# Patient Record
Sex: Female | Born: 1940 | Race: White | Hispanic: No | Smoking: Never smoker
Health system: Southern US, Community
[De-identification: ages and names within clinical notes are randomized; demographics above are authoritative.]

## PROBLEM LIST (undated history)

## (undated) DIAGNOSIS — K2950 Unspecified chronic gastritis without bleeding: Secondary | ICD-10-CM

## (undated) DIAGNOSIS — G4730 Sleep apnea, unspecified: Secondary | ICD-10-CM

## (undated) DIAGNOSIS — K229 Disease of esophagus, unspecified: Secondary | ICD-10-CM

## (undated) DIAGNOSIS — K7689 Other specified diseases of liver: Secondary | ICD-10-CM

## (undated) DIAGNOSIS — K449 Diaphragmatic hernia without obstruction or gangrene: Secondary | ICD-10-CM

## (undated) DIAGNOSIS — M797 Fibromyalgia: Secondary | ICD-10-CM

## (undated) DIAGNOSIS — K2100 Gastro-esophageal reflux disease with esophagitis, without bleeding: Secondary | ICD-10-CM

## (undated) DIAGNOSIS — K8020 Calculus of gallbladder without cholecystitis without obstruction: Secondary | ICD-10-CM

## (undated) DIAGNOSIS — I10 Essential (primary) hypertension: Secondary | ICD-10-CM

## (undated) DIAGNOSIS — S59909A Unspecified injury of unspecified elbow, initial encounter: Secondary | ICD-10-CM

## (undated) DIAGNOSIS — I4891 Unspecified atrial fibrillation: Secondary | ICD-10-CM

## (undated) DIAGNOSIS — E7800 Pure hypercholesterolemia, unspecified: Secondary | ICD-10-CM

## (undated) DIAGNOSIS — R7611 Nonspecific reaction to tuberculin skin test without active tuberculosis: Secondary | ICD-10-CM

## (undated) DIAGNOSIS — D126 Benign neoplasm of colon, unspecified: Secondary | ICD-10-CM

## (undated) DIAGNOSIS — I639 Cerebral infarction, unspecified: Secondary | ICD-10-CM

## (undated) DIAGNOSIS — N289 Disorder of kidney and ureter, unspecified: Secondary | ICD-10-CM

## (undated) DIAGNOSIS — N189 Chronic kidney disease, unspecified: Secondary | ICD-10-CM

## (undated) DIAGNOSIS — K5790 Diverticulosis of intestine, part unspecified, without perforation or abscess without bleeding: Secondary | ICD-10-CM

## (undated) DIAGNOSIS — D649 Anemia, unspecified: Secondary | ICD-10-CM

## (undated) DIAGNOSIS — M1990 Unspecified osteoarthritis, unspecified site: Secondary | ICD-10-CM

## (undated) DIAGNOSIS — M8580 Other specified disorders of bone density and structure, unspecified site: Secondary | ICD-10-CM

## (undated) HISTORY — PX: ADENOIDECTOMY: SUR15

## (undated) HISTORY — PX: BREAST BIOPSY: SHX20

## (undated) HISTORY — PX: CHOLECYSTECTOMY: SHX55

## (undated) HISTORY — PX: TONSILLECTOMY: SUR1361

## (undated) HISTORY — PX: APPENDECTOMY: SHX54

## (undated) HISTORY — DX: Pure hypercholesterolemia, unspecified: E78.00

## (undated) HISTORY — PX: RHIZOTOMY: SHX2355

## (undated) HISTORY — DX: Chronic kidney disease, unspecified: N18.9

## (undated) HISTORY — PX: JOINT REPLACEMENT: SHX530

## (undated) HISTORY — PX: OTHER SURGICAL HISTORY: SHX169

## (undated) HISTORY — DX: Fibromyalgia: M79.7

## (undated) HISTORY — PX: BILATERAL OOPHORECTOMY: SHX1221

## (undated) HISTORY — DX: Sleep apnea, unspecified: G47.30

## (undated) HISTORY — PX: BACK SURGERY: SHX140

## (undated) HISTORY — DX: Other specified diseases of liver: K76.89

## (undated) HISTORY — PX: BREAST EXCISIONAL BIOPSY: SUR124

## (undated) HISTORY — PX: TOTAL KNEE ARTHROPLASTY: SHX125

---

## 1964-10-06 HISTORY — PX: DILATION AND CURETTAGE OF UTERUS: SHX78

## 1969-10-06 HISTORY — PX: TUBAL LIGATION: SHX77

## 2004-10-29 ENCOUNTER — Inpatient Hospital Stay: Payer: Self-pay | Admitting: Specialist

## 2005-02-10 ENCOUNTER — Ambulatory Visit: Payer: Self-pay | Admitting: Specialist

## 2005-02-26 ENCOUNTER — Ambulatory Visit: Payer: Self-pay | Admitting: Internal Medicine

## 2005-07-14 ENCOUNTER — Ambulatory Visit: Payer: Self-pay | Admitting: Internal Medicine

## 2005-08-06 ENCOUNTER — Ambulatory Visit: Payer: Self-pay | Admitting: Internal Medicine

## 2009-07-30 ENCOUNTER — Ambulatory Visit: Payer: Self-pay | Admitting: Internal Medicine

## 2009-09-07 ENCOUNTER — Ambulatory Visit: Payer: Self-pay | Admitting: Gastroenterology

## 2009-09-27 ENCOUNTER — Ambulatory Visit: Payer: Self-pay | Admitting: Surgery

## 2009-10-04 ENCOUNTER — Ambulatory Visit: Payer: Self-pay | Admitting: Surgery

## 2011-08-19 ENCOUNTER — Ambulatory Visit: Payer: Self-pay | Admitting: Internal Medicine

## 2012-02-17 ENCOUNTER — Ambulatory Visit: Payer: Self-pay | Admitting: Internal Medicine

## 2014-01-02 ENCOUNTER — Ambulatory Visit: Payer: Self-pay | Admitting: Physician Assistant

## 2014-06-20 LAB — HM PAP SMEAR

## 2014-08-08 ENCOUNTER — Ambulatory Visit: Payer: Self-pay | Admitting: Obstetrics and Gynecology

## 2014-08-08 LAB — HM MAMMOGRAPHY

## 2014-08-08 LAB — HM DEXA SCAN

## 2014-11-09 ENCOUNTER — Ambulatory Visit: Payer: Self-pay | Admitting: Gastroenterology

## 2014-11-21 ENCOUNTER — Ambulatory Visit: Payer: Self-pay | Admitting: Gastroenterology

## 2014-11-21 DIAGNOSIS — K229 Disease of esophagus, unspecified: Secondary | ICD-10-CM

## 2014-11-21 HISTORY — DX: Disease of esophagus, unspecified: K22.9

## 2014-11-28 ENCOUNTER — Ambulatory Visit: Payer: Self-pay | Admitting: Gastroenterology

## 2015-01-29 LAB — SURGICAL PATHOLOGY

## 2015-04-19 DIAGNOSIS — G4733 Obstructive sleep apnea (adult) (pediatric): Secondary | ICD-10-CM | POA: Insufficient documentation

## 2015-06-26 ENCOUNTER — Ambulatory Visit (INDEPENDENT_AMBULATORY_CARE_PROVIDER_SITE_OTHER): Payer: Medicare Other | Admitting: Obstetrics and Gynecology

## 2015-06-26 ENCOUNTER — Encounter: Payer: Self-pay | Admitting: Obstetrics and Gynecology

## 2015-06-26 VITALS — BP 108/71 | HR 89 | Ht 67.0 in | Wt 227.1 lb

## 2015-06-26 DIAGNOSIS — I480 Paroxysmal atrial fibrillation: Secondary | ICD-10-CM | POA: Insufficient documentation

## 2015-06-26 DIAGNOSIS — E785 Hyperlipidemia, unspecified: Secondary | ICD-10-CM | POA: Insufficient documentation

## 2015-06-26 DIAGNOSIS — Z01419 Encounter for gynecological examination (general) (routine) without abnormal findings: Secondary | ICD-10-CM | POA: Diagnosis not present

## 2015-06-26 DIAGNOSIS — Z8739 Personal history of other diseases of the musculoskeletal system and connective tissue: Secondary | ICD-10-CM | POA: Insufficient documentation

## 2015-06-26 DIAGNOSIS — Z780 Asymptomatic menopausal state: Secondary | ICD-10-CM | POA: Diagnosis not present

## 2015-06-26 DIAGNOSIS — D649 Anemia, unspecified: Secondary | ICD-10-CM | POA: Insufficient documentation

## 2015-06-26 DIAGNOSIS — K7689 Other specified diseases of liver: Secondary | ICD-10-CM | POA: Insufficient documentation

## 2015-06-26 DIAGNOSIS — I10 Essential (primary) hypertension: Secondary | ICD-10-CM | POA: Insufficient documentation

## 2015-06-26 DIAGNOSIS — M1990 Unspecified osteoarthritis, unspecified site: Secondary | ICD-10-CM | POA: Insufficient documentation

## 2015-06-26 DIAGNOSIS — Z1239 Encounter for other screening for malignant neoplasm of breast: Secondary | ICD-10-CM

## 2015-06-26 DIAGNOSIS — M8580 Other specified disorders of bone density and structure, unspecified site: Secondary | ICD-10-CM | POA: Insufficient documentation

## 2015-06-26 DIAGNOSIS — N183 Chronic kidney disease, stage 3 unspecified: Secondary | ICD-10-CM | POA: Insufficient documentation

## 2015-06-26 DIAGNOSIS — I4891 Unspecified atrial fibrillation: Secondary | ICD-10-CM | POA: Insufficient documentation

## 2015-06-26 NOTE — Progress Notes (Signed)
GYNECOLOGY CLINIC PROGRESS NOTE  Subjective:    Jaclyn Galvan is a 74 y.o. [phone] postmenopausal female who presents for an annual exam. The patient has no complaints today. The patient is not currently sexually active. GYN screening history: last pap: patient does not recall when last pap was (several years ago), but denies h/o abnormal pap smears. Last mammogram: approximate date 08/2014 and was normal. The patient wears seatbelts: yes. The patient participates in regular exercise: no. Has the patient ever been transfused or tattooed?: no. The patient reports that there is not domestic violence in her life.  The patient currently experiences occasional hot flushes, for which she treats with OTC Estroven.  Denies episodes of postmenopausal bleeding.   The patient doese a DEXA scan diagnosis of Osteoporosis. Last DEXA Scan: 06/2012. Was previously on Fosamax for 7 years, discontinued 3 years ago. She is routinely taking Calcium with Vitamin D.  Menstrual History: OB History    Gravida Para Term Preterm AB TAB SAB Ectopic Multiple Living   6 3 3  3  3   3       No LMP recorded. Patient is postmenopausal.    Past Medical History  Diagnosis Date  . Skin cancer   . Breast cancer   . Fibromyalgia   . Liver cyst   . CKD (chronic kidney disease)   . Hypercholesterolemia   . Sleep apnea     Family History  Problem Relation Age of Onset  . Breast cancer Mother  (deceased at age 76)  . Diabetes Neg Hx   . Heart disease Neg Hx   . Colon cancer Neg Hx   . Ovarian cancer Neg Hx    Past Surgical History  Procedure Laterality Date  . Cholecystectomy    . Tubal ligation  1971  . Dilation and curettage of uterus  1966  . Appendectomy    . Bilateral oophorectomy Right   . Tonsillectomy    . Adenoidectomy    . Mastectomy Right   . Total knee arthroplasty Bilateral     2001,2006  . Back surgery       Social History   Social History  . Marital Status: Married    Spouse Name: N/A   . Number of Children: N/A  . Years of Education: N/A   Occupational History  . Not on file.   Social History Main Topics  . Smoking status: Never Smoker   . Smokeless tobacco: Never Used  . Alcohol Use: No  . Drug Use: No  . Sexual Activity: Not Currently    Birth Control/ Protection: Post-menopausal   Other Topics Concern  . Not on file   Social History Narrative   Current Outpatient Prescriptions on File Prior to Visit  Medication Sig Dispense Refill  . aspirin EC 81 MG tablet Take 81 mg by mouth daily.    Marland Kitchen aspirin-acetaminophen-caffeine (EXCEDRIN MIGRAINE) 250-250-65 MG per tablet Take by mouth every 6 (six) hours as needed for headache.    Marland Kitchen atorvastatin (LIPITOR) 10 MG tablet Take 10 mg by mouth daily.    . Black Cohosh-SoyIsoflav-Magnol (ESTROVEN MENOPAUSE RELIEF) CAPS Take by mouth.    . calcium carbonate (OS-CAL) 1250 (500 CA) MG chewable tablet Chew 1 tablet by mouth daily.    . cholecalciferol (VITAMIN D) 400 UNITS TABS tablet Take 400 Units by mouth.    . co-enzyme Q-10 30 MG capsule Take 30 mg by mouth 3 (three) times daily.    Marland Kitchen docusate sodium (COLACE) 100  MG capsule Take 100 mg by mouth 2 (two) times daily.    . DULoxetine (CYMBALTA) 20 MG capsule Take 20 mg by mouth daily.    Marland Kitchen losartan (COZAAR) 25 MG tablet Take 25 mg by mouth daily.    . Omega-3 Fatty Acids (FISH OIL) 1000 MG CAPS Take by mouth.    . vitamin B-12 (CYANOCOBALAMIN) 100 MCG tablet Take 100 mcg by mouth daily.     No current facility-administered medications on file prior to visit.    Allergies  Allergen Reactions  . Ciprofloxacin   . Morphine And Related     Review of Systems Constitutional: negative for chills, fatigue, fevers and sweats Eyes: negative for irritation, redness and visual disturbance Ears, nose, mouth, throat, and face: negative for hearing loss, nasal congestion, snoring and tinnitus Respiratory: negative for asthma, cough, sputum Cardiovascular: negative for chest  pain, dyspnea, exertional chest pressure/discomfort, irregular heart beat, palpitations and syncope Gastrointestinal: negative for abdominal pain, change in bowel habits, nausea and vomiting Genitourinary: positive for occasional urinary urgency (mostly in the mornings). Negative for abnormal menstrual periods, genital lesions, sexual problems and vaginal discharge, dysuria and urinary incontinence. Integument/breast: negative for breast lump, breast tenderness and nipple discharge Hematologic/lymphatic: negative for bleeding and easy bruising Musculoskeletal:negative for back pain and muscle weakness Neurological: negative for dizziness, headaches, vertigo and weakness Endocrine: negative for diabetic symptoms including polydipsia, polyuria and skin dryness Allergic/Immunologic: negative for hay fever and urticaria     Objective:  Blood pressure 108/71, pulse 89, height 5\' 7"  (1.702 m), weight 227 lb 1 oz (102.995 kg).  General Appearance:    Alert, cooperative, no distress, appears stated age  Head:    Normocephalic, without obvious abnormality, atraumatic  Eyes:    PERRL, conjunctiva/corneas clear, EOM's intact, both eyes  Ears:    Normal external ear canals, both ears  Nose:   Nares normal, septum midline, mucosa normal, no drainage or sinus tenderness  Throat:   Lips, mucosa, and tongue normal; teeth and gums normal  Neck:   Supple, symmetrical, trachea midline, no adenopathy; thyroid: no enlargement/tenderness/nodules; no carotid bruit or JVD  Back:     Symmetric, no curvature, ROM normal, no CVA tenderness  Lungs:     Clear to auscultation bilaterally, respirations unlabored  Chest Wall:    No tenderness or deformity   Heart:    Regular rate and rhythm, S1 and S2 normal, no murmur, rub or gallop  Breast Exam:    No tenderness, masses, or nipple abnormality  Abdomen:     Soft, non-tender, bowel sounds active all four quadrants, no masses, no organomegaly. Vertical accentric  inframumbilical scar present.   Genitalia:    Pelvic:external genitalia normal, vagina with lesions, discharge, or tenderness, rectovaginal septum  normal. Cervix normal in appearance, no cervical motion tenderness, no adnexal masses or tenderness.  Senile uterus, nontender.    Rectal:    Normal external sphincter.  No hemorrhoids appreciated. Internal exam not done.   Extremities:   Extremities normal, atraumatic, no cyanosis or edema.  Severe varicose veins present in lower extremities bilaterally.   Pulses:   2+ and symmetric all extremities  Skin:   Skin color, texture, turgor normal, no rashes or lesions  Lymph nodes:   Cervical, supraclavicular, and axillary nodes normal  Neurologic:   CNII-XII intact, normal strength, sensation and reflexes throughout     Assessment:    Healthy postmenopausal female exam.    Remote h/o breast cancer Mild postmenopausal vasomotor symptoms, controlled  Plan:     Breast self exam technique reviewed and patient encouraged to perform self-exam monthly. Discussed healthy lifestyle modifications.  Is planning to resume exercising regularly (discontinued for a while due to dx of plantar fascitis).  Mammogram. To be ordered.  Will likely discontinue after next year.  Dexa Scan with T score of -1.4 on 08/2014. H/o osteoporosis s/p treatment.  Will need repeat next year. To continue Vit D and Calcium supplementation.  Can continue with Estroven as needed for menopausal mild vasomotor symptoms.  Will order Colonoscopy if has not been performed within past 10 years.  RTC in 1 year.    Rubie Maid, MD [facility]

## 2015-06-26 NOTE — Patient Instructions (Addendum)
Preventive Care for Adults A healthy lifestyle and preventive care can promote health and wellness. Preventive health guidelines for women include the following key practices.  A routine yearly physical is a good way to check with your health care provider about your health and preventive screening. It is a chance to share any concerns and updates on your health and to receive a thorough exam.  Visit your dentist for a routine exam and preventive care every 6 months. Brush your teeth twice a day and floss once a day. Good oral hygiene prevents tooth decay and gum disease.  The frequency of eye exams is based on your age, health, family medical history, use of contact lenses, and other factors. Follow your health care provider's recommendations for frequency of eye exams.  Eat a healthy diet. Foods like vegetables, fruits, whole grains, low-fat dairy products, and lean protein foods contain the nutrients you need without too many calories. Decrease your intake of foods high in solid fats, added sugars, and salt. Eat the right amount of calories for you.Get information about a proper diet from your health care provider, if necessary.  Regular physical exercise is one of the most important things you can do for your health. Most adults should get at least 150 minutes of moderate-intensity exercise (any activity that increases your heart rate and causes you to sweat) each week. In addition, most adults need muscle-strengthening exercises on 2 or more days a week.  Maintain a healthy weight. The body mass index (BMI) is a screening tool to identify possible weight problems. It provides an estimate of body fat based on height and weight. Your health care provider can find your BMI and can help you achieve or maintain a healthy weight.For adults 20 years and older:  A BMI below 18.5 is considered underweight.  A BMI of 18.5 to 24.9 is normal.  A BMI of 25 to 29.9 is considered overweight.  A BMI of  30 and above is considered obese.  Maintain normal blood lipids and cholesterol levels by exercising and minimizing your intake of saturated fat. Eat a balanced diet with plenty of fruit and vegetables. Blood tests for lipids and cholesterol should begin at age 20 and be repeated every 5 years. If your lipid or cholesterol levels are high, you are over 50, or you are at high risk for heart disease, you may need your cholesterol levels checked more frequently.Ongoing high lipid and cholesterol levels should be treated with medicines if diet and exercise are not working.  If you smoke, find out from your health care provider how to quit. If you do not use tobacco, do not start.  Lung cancer screening is recommended for adults aged 55-80 years who are at high risk for developing lung cancer because of a history of smoking. A yearly low-dose CT scan of the lungs is recommended for people who have at least a 30-pack-year history of smoking and are a current smoker or have quit within the past 15 years. A pack year of smoking is smoking an average of 1 pack of cigarettes a day for 1 year (for example: 1 pack a day for 30 years or 2 packs a day for 15 years). Yearly screening should continue until the smoker has stopped smoking for at least 15 years. Yearly screening should be stopped for people who develop a health problem that would prevent them from having lung cancer treatment.  If you are pregnant, do not drink alcohol. If you are breastfeeding,   be very cautious about drinking alcohol. If you are not pregnant and choose to drink alcohol, do not have more than 1 drink per day. One drink is considered to be 12 ounces (355 mL) of beer, 5 ounces (148 mL) of wine, or 1.5 ounces (44 mL) of liquor.  Avoid use of street drugs. Do not share needles with anyone. Ask for help if you need support or instructions about stopping the use of drugs.  High blood pressure causes heart disease and increases the risk of  stroke. Your blood pressure should be checked at least every 1 to 2 years. Ongoing high blood pressure should be treated with medicines if weight loss and exercise do not work.  If you are 75-52 years old, ask your health care provider if you should take aspirin to prevent strokes.  Diabetes screening involves taking a blood sample to check your fasting blood sugar level. This should be done once every 3 years, after age 15, if you are within normal weight and without risk factors for diabetes. Testing should be considered at a younger age or be carried out more frequently if you are overweight and have at least 1 risk factor for diabetes.  Breast cancer screening is essential preventive care for women. You should practice "breast self-awareness." This means understanding the normal appearance and feel of your breasts and may include breast self-examination. Any changes detected, no matter how small, should be reported to a health care provider. Women in their 58s and 30s should have a clinical breast exam (CBE) by a health care provider as part of a regular health exam every 1 to 3 years. After age 16, women should have a CBE every year. Starting at age 53, women should consider having a mammogram (breast X-ray test) every year. Women who have a family history of breast cancer should talk to their health care provider about genetic screening. Women at a high risk of breast cancer should talk to their health care providers about having an MRI and a mammogram every year.  Breast cancer gene (BRCA)-related cancer risk assessment is recommended for women who have family members with BRCA-related cancers. BRCA-related cancers include breast, ovarian, tubal, and peritoneal cancers. Having family members with these cancers may be associated with an increased risk for harmful changes (mutations) in the breast cancer genes BRCA1 and BRCA2. Results of the assessment will determine the need for genetic counseling and  BRCA1 and BRCA2 testing.  Routine pelvic exams to screen for cancer are no longer recommended for nonpregnant women who are considered low risk for cancer of the pelvic organs (ovaries, uterus, and vagina) and who do not have symptoms. Ask your health care provider if a screening pelvic exam is right for you.  If you have had past treatment for cervical cancer or a condition that could lead to cancer, you need Pap tests and screening for cancer for at least 20 years after your treatment. If Pap tests have been discontinued, your risk factors (such as having a new sexual partner) need to be reassessed to determine if screening should be resumed. Some women have medical problems that increase the chance of getting cervical cancer. In these cases, your health care provider may recommend more frequent screening and Pap tests.  The HPV test is an additional test that may be used for cervical cancer screening. The HPV test looks for the virus that can cause the cell changes on the cervix. The cells collected during the Pap test can be  tested for HPV. The HPV test could be used to screen women aged 30 years and older, and should be used in women of any age who have unclear Pap test results. After the age of 30, women should have HPV testing at the same frequency as a Pap test.  Colorectal cancer can be detected and often prevented. Most routine colorectal cancer screening begins at the age of 50 years and continues through age 75 years. However, your health care provider may recommend screening at an earlier age if you have risk factors for colon cancer. On a yearly basis, your health care provider may provide home test kits to check for hidden blood in the stool. Use of a small camera at the end of a tube, to directly examine the colon (sigmoidoscopy or colonoscopy), can detect the earliest forms of colorectal cancer. Talk to your health care provider about this at age 50, when routine screening begins. Direct  exam of the colon should be repeated every 5-10 years through age 75 years, unless early forms of pre-cancerous polyps or small growths are found.  People who are at an increased risk for hepatitis B should be screened for this virus. You are considered at high risk for hepatitis B if:  You were born in a country where hepatitis B occurs often. Talk with your health care provider about which countries are considered high risk.  Your parents were born in a high-risk country and you have not received a shot to protect against hepatitis B (hepatitis B vaccine).  You have HIV or AIDS.  You use needles to inject street drugs.  You live with, or have sex with, someone who has hepatitis B.  You get hemodialysis treatment.  You take certain medicines for conditions like cancer, organ transplantation, and autoimmune conditions.  Hepatitis C blood testing is recommended for all people born from 1945 through 1965 and any individual with known risks for hepatitis C.  Practice safe sex. Use condoms and avoid high-risk sexual practices to reduce the spread of sexually transmitted infections (STIs). STIs include gonorrhea, chlamydia, syphilis, trichomonas, herpes, HPV, and human immunodeficiency virus (HIV). Herpes, HIV, and HPV are viral illnesses that have no cure. They can result in disability, cancer, and death.  You should be screened for sexually transmitted illnesses (STIs) including gonorrhea and chlamydia if:  You are sexually active and are younger than 24 years.  You are older than 24 years and your health care provider tells you that you are at risk for this type of infection.  Your sexual activity has changed since you were last screened and you are at an increased risk for chlamydia or gonorrhea. Ask your health care provider if you are at risk.  If you are at risk of being infected with HIV, it is recommended that you take a prescription medicine daily to prevent HIV infection. This is  called preexposure prophylaxis (PrEP). You are considered at risk if:  You are a heterosexual woman, are sexually active, and are at increased risk for HIV infection.  You take drugs by injection.  You are sexually active with a partner who has HIV.  Talk with your health care provider about whether you are at high risk of being infected with HIV. If you choose to begin PrEP, you should first be tested for HIV. You should then be tested every 3 months for as long as you are taking PrEP.  Osteoporosis is a disease in which the bones lose minerals and strength   with aging. This can result in serious bone fractures or breaks. The risk of osteoporosis can be identified using a bone density scan. Women ages 65 years and over and women at risk for fractures or osteoporosis should discuss screening with their health care providers. Ask your health care provider whether you should take a calcium supplement or vitamin D to reduce the rate of osteoporosis.  Menopause can be associated with physical symptoms and risks. Hormone replacement therapy is available to decrease symptoms and risks. You should talk to your health care provider about whether hormone replacement therapy is right for you.  Use sunscreen. Apply sunscreen liberally and repeatedly throughout the day. You should seek shade when your shadow is shorter than you. Protect yourself by wearing long sleeves, pants, a wide-brimmed hat, and sunglasses year round, whenever you are outdoors.  Once a month, do a whole body skin exam, using a mirror to look at the skin on your back. Tell your health care provider of new moles, moles that have irregular borders, moles that are larger than a pencil eraser, or moles that have changed in shape or color.  Stay current with required vaccines (immunizations).  Influenza vaccine. All adults should be immunized every year.  Tetanus, diphtheria, and acellular pertussis (Td, Tdap) vaccine. Pregnant women should  receive 1 dose of Tdap vaccine during each pregnancy. The dose should be obtained regardless of the length of time since the last dose. Immunization is preferred during the 27th-36th week of gestation. An adult who has not previously received Tdap or who does not know her vaccine status should receive 1 dose of Tdap. This initial dose should be followed by tetanus and diphtheria toxoids (Td) booster doses every 10 years. Adults with an unknown or incomplete history of completing a 3-dose immunization series with Td-containing vaccines should begin or complete a primary immunization series including a Tdap dose. Adults should receive a Td booster every 10 years.  Varicella vaccine. An adult without evidence of immunity to varicella should receive 2 doses or a second dose if she has previously received 1 dose. Pregnant females who do not have evidence of immunity should receive the first dose after pregnancy. This first dose should be obtained before leaving the health care facility. The second dose should be obtained 4-8 weeks after the first dose.  Human papillomavirus (HPV) vaccine. Females aged 13-26 years who have not received the vaccine previously should obtain the 3-dose series. The vaccine is not recommended for use in pregnant females. However, pregnancy testing is not needed before receiving a dose. If a female is found to be pregnant after receiving a dose, no treatment is needed. In that case, the remaining doses should be delayed until after the pregnancy. Immunization is recommended for any person with an immunocompromised condition through the age of 26 years if she did not get any or all doses earlier. During the 3-dose series, the second dose should be obtained 4-8 weeks after the first dose. The third dose should be obtained 24 weeks after the first dose and 16 weeks after the second dose.  Zoster vaccine. One dose is recommended for adults aged 60 years or older unless certain conditions are  present.  Measles, mumps, and rubella (MMR) vaccine. Adults born before 1957 generally are considered immune to measles and mumps. Adults born in 1957 or later should have 1 or more doses of MMR vaccine unless there is a contraindication to the vaccine or there is laboratory evidence of immunity to   each of the three diseases. A routine second dose of MMR vaccine should be obtained at least 28 days after the first dose for students attending postsecondary schools, health care workers, or international travelers. People who received inactivated measles vaccine or an unknown type of measles vaccine during 1963-1967 should receive 2 doses of MMR vaccine. People who received inactivated mumps vaccine or an unknown type of mumps vaccine before 1979 and are at high risk for mumps infection should consider immunization with 2 doses of MMR vaccine. For females of childbearing age, rubella immunity should be determined. If there is no evidence of immunity, females who are not pregnant should be vaccinated. If there is no evidence of immunity, females who are pregnant should delay immunization until after pregnancy. Unvaccinated health care workers born before 1957 who lack laboratory evidence of measles, mumps, or rubella immunity or laboratory confirmation of disease should consider measles and mumps immunization with 2 doses of MMR vaccine or rubella immunization with 1 dose of MMR vaccine.  Pneumococcal 13-valent conjugate (PCV13) vaccine. When indicated, a person who is uncertain of her immunization history and has no record of immunization should receive the PCV13 vaccine. An adult aged 19 years or older who has certain medical conditions and has not been previously immunized should receive 1 dose of PCV13 vaccine. This PCV13 should be followed with a dose of pneumococcal polysaccharide (PPSV23) vaccine. The PPSV23 vaccine dose should be obtained at least 8 weeks after the dose of PCV13 vaccine. An adult aged 19  years or older who has certain medical conditions and previously received 1 or more doses of PPSV23 vaccine should receive 1 dose of PCV13. The PCV13 vaccine dose should be obtained 1 or more years after the last PPSV23 vaccine dose.  Pneumococcal polysaccharide (PPSV23) vaccine. When PCV13 is also indicated, PCV13 should be obtained first. All adults aged 65 years and older should be immunized. An adult younger than age 65 years who has certain medical conditions should be immunized. Any person who resides in a nursing home or long-term care facility should be immunized. An adult smoker should be immunized. People with an immunocompromised condition and certain other conditions should receive both PCV13 and PPSV23 vaccines. People with human immunodeficiency virus (HIV) infection should be immunized as soon as possible after diagnosis. Immunization during chemotherapy or radiation therapy should be avoided. Routine use of PPSV23 vaccine is not recommended for American Indians, [place] Natives, or people younger than 65 years unless there are medical conditions that require PPSV23 vaccine. When indicated, people who have unknown immunization and have no record of immunization should receive PPSV23 vaccine. One-time revaccination 5 years after the first dose of PPSV23 is recommended for people aged 19-64 years who have chronic kidney failure, nephrotic syndrome, asplenia, or immunocompromised conditions. People who received 1-2 doses of PPSV23 before age 65 years should receive another dose of PPSV23 vaccine at age 65 years or later if at least 5 years have passed since the previous dose. Doses of PPSV23 are not needed for people immunized with PPSV23 at or after age 65 years.  Meningococcal vaccine. Adults with asplenia or persistent complement component deficiencies should receive 2 doses of quadrivalent meningococcal conjugate (MenACWY-D) vaccine. The doses should be obtained at least 2 months apart.  Microbiologists working with certain meningococcal bacteria, military recruits, people at risk during an outbreak, and people who travel to or live in countries with a high rate of meningitis should be immunized. A first-year college student up through age   21 years who is living in a residence hall should receive a dose if she did not receive a dose on or after her 16th birthday. Adults who have certain high-risk conditions should receive one or more doses of vaccine.  Hepatitis A vaccine. Adults who wish to be protected from this disease, have certain high-risk conditions, work with hepatitis A-infected animals, work in hepatitis A research labs, or travel to or work in countries with a high rate of hepatitis A should be immunized. Adults who were previously unvaccinated and who anticipate close contact with an international adoptee during the first 60 days after arrival in the [place] from a country with a high rate of hepatitis A should be immunized.  Hepatitis B vaccine. Adults who wish to be protected from this disease, have certain high-risk conditions, may be exposed to blood or other infectious body fluids, are household contacts or sex partners of hepatitis B positive people, are clients or workers in certain care facilities, or travel to or work in countries with a high rate of hepatitis B should be immunized.  Haemophilus influenzae type b (Hib) vaccine. A previously unvaccinated person with asplenia or sickle cell disease or having a scheduled splenectomy should receive 1 dose of Hib vaccine. Regardless of previous immunization, a recipient of a hematopoietic stem cell transplant should receive a 3-dose series 6-12 months after her successful transplant. Hib vaccine is not recommended for adults with HIV infection. Preventive Services / Frequency Ages 64 to 68 years  Blood pressure check.** / Every 1 to 2 years.  Lipid and cholesterol check.** / Every 5 years beginning at age  22.  Clinical breast exam.** / Every 3 years for women in their 88s and 53s.  BRCA-related cancer risk assessment.** / For women who have family members with a BRCA-related cancer (breast, ovarian, tubal, or peritoneal cancers).  Pap test.** / Every 2 years from ages 90 through 51. Every 3 years starting at age 21 through age 56 or 3 with a history of 3 consecutive normal Pap tests.  HPV screening.** / Every 3 years from ages 24 through ages 1 to 46 with a history of 3 consecutive normal Pap tests.  Hepatitis C blood test.** / For any individual with known risks for hepatitis C.  Skin self-exam. / Monthly.  Influenza vaccine. / Every year.  Tetanus, diphtheria, and acellular pertussis (Tdap, Td) vaccine.** / Consult your health care provider. Pregnant women should receive 1 dose of Tdap vaccine during each pregnancy. 1 dose of Td every 10 years.  Varicella vaccine.** / Consult your health care provider. Pregnant females who do not have evidence of immunity should receive the first dose after pregnancy.  HPV vaccine. / 3 doses over 6 months, if 72 and younger. The vaccine is not recommended for use in pregnant females. However, pregnancy testing is not needed before receiving a dose.  Measles, mumps, rubella (MMR) vaccine.** / You need at least 1 dose of MMR if you were born in 1957 or later. You may also need a 2nd dose. For females of childbearing age, rubella immunity should be determined. If there is no evidence of immunity, females who are not pregnant should be vaccinated. If there is no evidence of immunity, females who are pregnant should delay immunization until after pregnancy.  Pneumococcal 13-valent conjugate (PCV13) vaccine.** / Consult your health care provider.  Pneumococcal polysaccharide (PPSV23) vaccine.** / 1 to 2 doses if you smoke cigarettes or if you have certain conditions.  Meningococcal vaccine.** /  1 dose if you are age 19 to 21 years and a first-year college  student living in a residence hall, or have one of several medical conditions, you need to get vaccinated against meningococcal disease. You may also need additional booster doses.  Hepatitis A vaccine.** / Consult your health care provider.  Hepatitis B vaccine.** / Consult your health care provider.  Haemophilus influenzae type b (Hib) vaccine.** / Consult your health care provider. Ages 40 to 64 years  Blood pressure check.** / Every 1 to 2 years.  Lipid and cholesterol check.** / Every 5 years beginning at age 20 years.  Lung cancer screening. / Every year if you are aged 55-80 years and have a 30-pack-year history of smoking and currently smoke or have quit within the past 15 years. Yearly screening is stopped once you have quit smoking for at least 15 years or develop a health problem that would prevent you from having lung cancer treatment.  Clinical breast exam.** / Every year after age 40 years.  BRCA-related cancer risk assessment.** / For women who have family members with a BRCA-related cancer (breast, ovarian, tubal, or peritoneal cancers).  Mammogram.** / Every year beginning at age 40 years and continuing for as long as you are in good health. Consult with your health care provider.  Pap test.** / Every 3 years starting at age 30 years through age 65 or 70 years with a history of 3 consecutive normal Pap tests.  HPV screening.** / Every 3 years from ages 30 years through ages 65 to 70 years with a history of 3 consecutive normal Pap tests.  Fecal occult blood test (FOBT) of stool. / Every year beginning at age 50 years and continuing until age 75 years. You may not need to do this test if you get a colonoscopy every 10 years.  Flexible sigmoidoscopy or colonoscopy.** / Every 5 years for a flexible sigmoidoscopy or every 10 years for a colonoscopy beginning at age 50 years and continuing until age 75 years.  Hepatitis C blood test.** / For all people born from 1945 through  1965 and any individual with known risks for hepatitis C.  Skin self-exam. / Monthly.  Influenza vaccine. / Every year.  Tetanus, diphtheria, and acellular pertussis (Tdap/Td) vaccine.** / Consult your health care provider. Pregnant women should receive 1 dose of Tdap vaccine during each pregnancy. 1 dose of Td every 10 years.  Varicella vaccine.** / Consult your health care provider. Pregnant females who do not have evidence of immunity should receive the first dose after pregnancy.  Zoster vaccine.** / 1 dose for adults aged 60 years or older.  Measles, mumps, rubella (MMR) vaccine.** / You need at least 1 dose of MMR if you were born in 1957 or later. You may also need a 2nd dose. For females of childbearing age, rubella immunity should be determined. If there is no evidence of immunity, females who are not pregnant should be vaccinated. If there is no evidence of immunity, females who are pregnant should delay immunization until after pregnancy.  Pneumococcal 13-valent conjugate (PCV13) vaccine.** / Consult your health care provider.  Pneumococcal polysaccharide (PPSV23) vaccine.** / 1 to 2 doses if you smoke cigarettes or if you have certain conditions.  Meningococcal vaccine.** / Consult your health care provider.  Hepatitis A vaccine.** / Consult your health care provider.  Hepatitis B vaccine.** / Consult your health care provider.  Haemophilus influenzae type b (Hib) vaccine.** / Consult your health care provider. Ages 65   years and over  Blood pressure check.** / Every 1 to 2 years.  Lipid and cholesterol check.** / Every 5 years beginning at age 22 years.  Lung cancer screening. / Every year if you are aged 73-80 years and have a 30-pack-year history of smoking and currently smoke or have quit within the past 15 years. Yearly screening is stopped once you have quit smoking for at least 15 years or develop a health problem that would prevent you from having lung cancer  treatment.  Clinical breast exam.** / Every year after age 4 years.  BRCA-related cancer risk assessment.** / For women who have family members with a BRCA-related cancer (breast, ovarian, tubal, or peritoneal cancers).  Mammogram.** / Every year beginning at age 40 years and continuing for as long as you are in good health. Consult with your health care provider.  Pap test.** / Every 3 years starting at age 9 years through age 34 or 91 years with 3 consecutive normal Pap tests. Testing can be stopped between 65 and 70 years with 3 consecutive normal Pap tests and no abnormal Pap or HPV tests in the past 10 years.  HPV screening.** / Every 3 years from ages 57 years through ages 64 or 45 years with a history of 3 consecutive normal Pap tests. Testing can be stopped between 65 and 70 years with 3 consecutive normal Pap tests and no abnormal Pap or HPV tests in the past 10 years.  Fecal occult blood test (FOBT) of stool. / Every year beginning at age 15 years and continuing until age 17 years. You may not need to do this test if you get a colonoscopy every 10 years.  Flexible sigmoidoscopy or colonoscopy.** / Every 5 years for a flexible sigmoidoscopy or every 10 years for a colonoscopy beginning at age 86 years and continuing until age 71 years.  Hepatitis C blood test.** / For all people born from [birth year] through 1965 and any individual with known risks for hepatitis C.  Osteoporosis screening.** / A one-time screening for women ages 83 years and over and women at risk for fractures or osteoporosis.  Skin self-exam. / Monthly.  Influenza vaccine. / Every year.  Tetanus, diphtheria, and acellular pertussis (Tdap/Td) vaccine.** / 1 dose of Td every 10 years.  Varicella vaccine.** / Consult your health care provider.  Zoster vaccine.** / 1 dose for adults aged 61 years or older.  Pneumococcal 13-valent conjugate (PCV13) vaccine.** / Consult your health care provider.  Pneumococcal  polysaccharide (PPSV23) vaccine.** / 1 dose for all adults aged 28 years and older.  Meningococcal vaccine.** / Consult your health care provider.  Hepatitis A vaccine.** / Consult your health care provider.  Hepatitis B vaccine.** / Consult your health care provider.  Haemophilus influenzae type b (Hib) vaccine.** / Consult your health care provider. ** Family history and personal history of risk and conditions may change your health care provider's recommendations. Document Released: 11/18/2001 Document Revised: 02/06/2014 Document Reviewed: 02/17/2011 [facility] Patient Information 2015 [place], [place]. This information is not intended to replace advice given to you by your health care provider. Make sure you discuss any questions you have with your health care provider.

## 2015-06-28 NOTE — Addendum Note (Signed)
Addended by: Earl Lagos on: 06/28/2015 08:19 AM   Modules accepted: Orders, Medications

## 2015-07-30 ENCOUNTER — Encounter: Payer: Self-pay | Admitting: *Deleted

## 2015-07-31 ENCOUNTER — Encounter: Payer: Self-pay | Admitting: Anesthesiology

## 2015-07-31 ENCOUNTER — Ambulatory Visit: Payer: Medicare Other | Admitting: Anesthesiology

## 2015-07-31 ENCOUNTER — Encounter
Admission: RE | Disposition: A | Discharge status: Home / Self Care | Payer: Self-pay | Source: Ambulatory Visit | Attending: Gastroenterology

## 2015-07-31 ENCOUNTER — Ambulatory Visit
Admission: RE | Admit: 2015-07-31 | Discharge: 2015-07-31 | Disposition: A | Discharge status: Home / Self Care | Payer: Medicare Other | Source: Ambulatory Visit | Attending: Gastroenterology | Admitting: Gastroenterology

## 2015-07-31 DIAGNOSIS — Z9011 Acquired absence of right breast and nipple: Secondary | ICD-10-CM | POA: Insufficient documentation

## 2015-07-31 DIAGNOSIS — Z9851 Tubal ligation status: Secondary | ICD-10-CM | POA: Diagnosis not present

## 2015-07-31 DIAGNOSIS — D132 Benign neoplasm of duodenum: Secondary | ICD-10-CM | POA: Diagnosis present

## 2015-07-31 DIAGNOSIS — M1990 Unspecified osteoarthritis, unspecified site: Secondary | ICD-10-CM | POA: Insufficient documentation

## 2015-07-31 DIAGNOSIS — Z9049 Acquired absence of other specified parts of digestive tract: Secondary | ICD-10-CM | POA: Diagnosis not present

## 2015-07-31 DIAGNOSIS — N189 Chronic kidney disease, unspecified: Secondary | ICD-10-CM | POA: Insufficient documentation

## 2015-07-31 DIAGNOSIS — Z85828 Personal history of other malignant neoplasm of skin: Secondary | ICD-10-CM | POA: Diagnosis not present

## 2015-07-31 DIAGNOSIS — Z79899 Other long term (current) drug therapy: Secondary | ICD-10-CM | POA: Insufficient documentation

## 2015-07-31 DIAGNOSIS — Z853 Personal history of malignant neoplasm of breast: Secondary | ICD-10-CM | POA: Diagnosis not present

## 2015-07-31 DIAGNOSIS — Z90722 Acquired absence of ovaries, bilateral: Secondary | ICD-10-CM | POA: Insufficient documentation

## 2015-07-31 DIAGNOSIS — Z7982 Long term (current) use of aspirin: Secondary | ICD-10-CM | POA: Diagnosis not present

## 2015-07-31 DIAGNOSIS — D126 Benign neoplasm of colon, unspecified: Secondary | ICD-10-CM

## 2015-07-31 DIAGNOSIS — Z881 Allergy status to other antibiotic agents status: Secondary | ICD-10-CM | POA: Insufficient documentation

## 2015-07-31 DIAGNOSIS — K2960 Other gastritis without bleeding: Secondary | ICD-10-CM | POA: Insufficient documentation

## 2015-07-31 DIAGNOSIS — I129 Hypertensive chronic kidney disease with stage 1 through stage 4 chronic kidney disease, or unspecified chronic kidney disease: Secondary | ICD-10-CM | POA: Diagnosis not present

## 2015-07-31 DIAGNOSIS — M797 Fibromyalgia: Secondary | ICD-10-CM | POA: Insufficient documentation

## 2015-07-31 DIAGNOSIS — Z885 Allergy status to narcotic agent status: Secondary | ICD-10-CM | POA: Insufficient documentation

## 2015-07-31 DIAGNOSIS — G4730 Sleep apnea, unspecified: Secondary | ICD-10-CM | POA: Insufficient documentation

## 2015-07-31 DIAGNOSIS — K449 Diaphragmatic hernia without obstruction or gangrene: Secondary | ICD-10-CM | POA: Diagnosis not present

## 2015-07-31 DIAGNOSIS — M8580 Other specified disorders of bone density and structure, unspecified site: Secondary | ICD-10-CM | POA: Diagnosis not present

## 2015-07-31 DIAGNOSIS — E7800 Pure hypercholesterolemia, unspecified: Secondary | ICD-10-CM | POA: Insufficient documentation

## 2015-07-31 HISTORY — DX: Anemia, unspecified: D64.9

## 2015-07-31 HISTORY — DX: Unspecified osteoarthritis, unspecified site: M19.90

## 2015-07-31 HISTORY — DX: Unspecified injury of unspecified elbow, initial encounter: S59.909A

## 2015-07-31 HISTORY — PX: ESOPHAGOGASTRODUODENOSCOPY (EGD) WITH PROPOFOL: SHX5813

## 2015-07-31 HISTORY — DX: Benign neoplasm of colon, unspecified: D12.6

## 2015-07-31 HISTORY — DX: Nonspecific reaction to tuberculin skin test without active tuberculosis: R76.11

## 2015-07-31 HISTORY — DX: Essential (primary) hypertension: I10

## 2015-07-31 HISTORY — DX: Other specified disorders of bone density and structure, unspecified site: M85.80

## 2015-07-31 SURGERY — ESOPHAGOGASTRODUODENOSCOPY (EGD) WITH PROPOFOL
Anesthesia: General

## 2015-07-31 MED ORDER — FENTANYL CITRATE (PF) 100 MCG/2ML IJ SOLN
INTRAMUSCULAR | Status: DC | PRN
  Administered 2015-07-31: 50 ug via INTRAVENOUS

## 2015-07-31 MED ORDER — PROPOFOL 10 MG/ML IV BOLUS
INTRAVENOUS | Status: DC | PRN
  Administered 2015-07-31: 50 mg via INTRAVENOUS
  Administered 2015-07-31: 100 mg via INTRAVENOUS
  Administered 2015-07-31: 50 mg via INTRAVENOUS
  Administered 2015-07-31 (×2): 100 mg via INTRAVENOUS
  Administered 2015-07-31: 50 mg via INTRAVENOUS

## 2015-07-31 MED ORDER — PROPOFOL 500 MG/50ML IV EMUL
INTRAVENOUS | Status: DC | PRN
  Administered 2015-07-31: 140 ug/kg/min via INTRAVENOUS

## 2015-07-31 MED ORDER — SODIUM CHLORIDE 0.9 % IV SOLN
INTRAVENOUS | Status: DC
  Administered 2015-07-31: 08:00:00 via INTRAVENOUS
  Administered 2015-07-31: 1000 mL via INTRAVENOUS

## 2015-07-31 MED ORDER — LIDOCAINE HCL (CARDIAC) 20 MG/ML IV SOLN
INTRAVENOUS | Status: DC | PRN
  Administered 2015-07-31: 30 mg via INTRAVENOUS

## 2015-07-31 MED ORDER — MIDAZOLAM HCL 5 MG/5ML IJ SOLN
INTRAMUSCULAR | Status: DC | PRN
  Administered 2015-07-31: 1 mg via INTRAVENOUS

## 2015-07-31 MED ORDER — SODIUM CHLORIDE 0.9 % IV SOLN: INTRAVENOUS | Status: DC

## 2015-07-31 NOTE — Anesthesia Postprocedure Evaluation (Signed)
  Anesthesia Post-op Note  Patient: Jaclyn Galvan  Procedure(s) Performed: Procedure(s): ESOPHAGOGASTRODUODENOSCOPY (EGD) WITH PROPOFOL (N/A)  Anesthesia type:General  Patient location: PACU  Post pain: Pain level controlled  Post assessment: Post-op Vital signs reviewed, Patient's Cardiovascular Status Stable, Respiratory Function Stable, Patent Airway and No signs of Nausea or vomiting  Post vital signs: Reviewed and stable  Last Vitals:  Filed Vitals:   07/31/15 0940  BP: 149/62  Pulse: 69  Temp:   Resp: 15    Level of consciousness: awake, alert  and patient cooperative  Complications: No apparent anesthesia complications

## 2015-07-31 NOTE — Anesthesia Preprocedure Evaluation (Signed)
Anesthesia Evaluation  Patient identified by MRN, date of birth, ID band Patient awake    Reviewed: Allergy & Precautions, H&P , NPO status , Patient's Chart, lab work & pertinent test results  History of Anesthesia Complications Negative for: history of anesthetic complications  Airway Mallampati: III  TM Distance: >3 FB Neck ROM: limited    Dental no notable dental hx. (+) Teeth Intact   Pulmonary neg shortness of breath, sleep apnea ,    Pulmonary exam normal breath sounds clear to auscultation       Cardiovascular Exercise Tolerance: Good hypertension, (-) angina(-) Past MI and (-) DOE Normal cardiovascular exam Rhythm:regular Rate:Normal     Neuro/Psych  Neuromuscular disease negative psych ROS   GI/Hepatic negative GI ROS, Neg liver ROS,   Endo/Other  negative endocrine ROS  Renal/GU Renal disease  negative genitourinary   Musculoskeletal  (+) Arthritis , Fibromyalgia -  Abdominal   Peds  Hematology negative hematology ROS (+)   Anesthesia Other Findings Past Medical History:   Skin cancer                                                  Breast cancer (HCC)                                          Fibromyalgia                                                 Liver cyst                                                   CKD (chronic kidney disease)                                 Hypercholesterolemia                                         Sleep apnea                                                  Anemia                                                       DJD (degenerative joint disease)                             Hypertension  Osteopenia                                                   Elbow injury                                                 PPD positive                                                Past Surgical History:   CHOLECYSTECTOMY                                                TUBAL LIGATION                                   1971         DILATION AND CURETTAGE OF UTERUS                 1966         APPENDECTOMY                                                  BILATERAL OOPHORECTOMY                          Right              TONSILLECTOMY                                                 ADENOIDECTOMY                                                 MASTECTOMY                                      Right              TOTAL KNEE ARTHROPLASTY                         Bilateral                Comment:2001,2006   BACK SURGERY  BMI    Body Mass Index   34.44 kg/m 2      Reproductive/Obstetrics negative OB ROS                             Anesthesia Physical Anesthesia Plan  ASA: III  Anesthesia Plan: General   Post-op Pain Management:    Induction:   Airway Management Planned:   Additional Equipment:   Intra-op Plan:   Post-operative Plan:   Informed Consent: I have reviewed the patients History and Physical, chart, labs and discussed the procedure including the risks, benefits and alternatives for the proposed anesthesia with the patient or authorized representative who has indicated his/her understanding and acceptance.   Dental Advisory Given  Plan Discussed with: Anesthesiologist, CRNA and Surgeon  Anesthesia Plan Comments:         Anesthesia Quick Evaluation

## 2015-07-31 NOTE — Op Note (Signed)
[facility] Gastroenterology Patient Name: Jaclyn Galvan Procedure Date: 07/31/2015 8:15 AM MRN: [id number] Account #: [id number] Date of Birth: [birth year] Admit Type: Outpatient Age: 74 Room: [facility] ENDO ROOM 3 Gender: Female Note Status: Finalized Procedure:         Upper GI endoscopy Indications:       Surveillance procedure Providers:         [name], MD Referring MD:      Ramonita Lab, MD (Referring MD) Medicines:         Monitored Anesthesia Care Complications:     No immediate complications. Procedure:         Pre-Anesthesia Assessment:                    - ASA Grade Assessment: III - A patient with severe                     systemic disease.                    After obtaining informed consent, the endoscope was passed                     under direct vision. Throughout the procedure, the                     patient's blood pressure, pulse, and oxygen saturations                     were monitored continuously. The Endoscope was introduced                     through the mouth, and advanced to the third part of                     duodenum. The upper GI endoscopy was accomplished without                     difficulty. The patient tolerated the procedure well. Findings:      A medium-sized hiatus hernia was found. The Z-line was a variable       distance from incisors; the hiatal hernia was sliding.      The Z-line was irregular. Biopsies were taken with a cold forceps for       histology.      Patchy moderate inflammation characterized by congestion (edema),       erosions and erythema was found in the gastric antrum. Biopsies were       taken with a cold forceps for histology. Biopsies were taken with a cold       forceps for Helicobacter pylori testing.      A medium-sized hiatus hernia was found. The Z-line was a variable       distance from incisors; the hiatal hernia was sliding. Retroflex       evaluation otherwise normal.      A small  sessile `polypoidmass, consistent with adenoma, with no bleeding       was found in the second part of the duodenum. The polyp was removed with       a cold biopsy forceps. Resection and retrieval were complete.      Initial biopsy site in the duodenum was noted to be clotting relatively       normally, however all other sites bled more than expected, necessitating  a long observation, good hemostasis noted being achieved. Impression:        - Medium-sized hiatus hernia.                    - Z-line irregular. Biopsied.                    - Erosive gastritis. Biopsied.                    - Medium-sized hiatus hernia.                    - polyp (suspected adenoma) at 2nd part of the duodenum. Recommendation:    - Await pathology results.                    - Return to GI clinic in 4 weeks.                    - Clear liquid diet for 2 days.                    - Full liquid diet for 2 days.                    - Soft diet for 1 week. Procedure Code(s): --- Professional ---                    [phone], Esophagogastroduodenoscopy, flexible, transoral;                     with biopsy, single or multiple Diagnosis Code(s): --- Professional ---                    530.89, Other specified disorders of esophagus                    535.40, Other specified gastritis, without mention of                     hemorrhage                    537.9, Unspecified disorder of stomach and duodenum                    553.3, Diaphragmatic hernia without mention of obstruction                     or gangrene CPT copyright 2014 American Medical Association. All rights reserved. The codes documented in this report are preliminary and upon coder review may  be revised to meet current compliance requirements. [name], MD 07/31/2015 9:21:57 AM This report has been signed electronically. Number of Addenda: 0 Note Initiated On: 07/31/2015 8:15 AM      [facility]

## 2015-07-31 NOTE — H&P (Signed)
Outpatient short stay form Pre-procedure 07/31/2015 8:18 AM Jaclyn Sails MD  Primary Physician: Dr. Ramonita Lab  Reason for visit:  EGD  History of present illness:  Patient is a 74 year old female presenting today in follow-up of a previous EGD done in April 2016. 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 Since that time she has been taking Protonix daily.  States that she did not stop her 81 mg aspirin the last one being taken yesterday evening. We discussed this and she is aware that this will increase the risk of bleeding for this procedure should biopsies need to be taken. sHe wishes to proceed.    Current facility-administered medications:  .  0.9 %  sodium chloride infusion, , Intravenous, Continuous, Jaclyn Sails, MD, Last Rate: 20 mL/hr at 07/31/15 0724 .  0.9 %  sodium chloride infusion, , Intravenous, Continuous, Jaclyn Sails, MD  Prescriptions prior to admission  Medication Sig Dispense Refill Last Dose  . aspirin EC 81 MG tablet Take 81 mg by mouth daily.   07/30/2015 at 2300  . aspirin-acetaminophen-caffeine (EXCEDRIN MIGRAINE) 250-250-65 MG per tablet Take by mouth every 6 (six) hours as needed for headache.   07/30/2015 at 2300  . atorvastatin (LIPITOR) 10 MG tablet Take 5 mg by mouth daily.    07/30/2015 at 2300  . Black Cohosh-SoyIsoflav-Magnol (ESTROVEN MENOPAUSE RELIEF) CAPS Take by mouth at bedtime.    07/30/2015 at 2300  . co-enzyme Q-10 30 MG capsule Take 30 mg by mouth daily.    07/30/2015 at 2300  . DULoxetine (CYMBALTA) 20 MG capsule Take 20 mg by mouth daily.   07/30/2015 at 2300  . losartan (COZAAR) 25 MG tablet Take 25 mg by mouth daily.   07/30/2015 at 2300  . pantoprazole (PROTONIX) 40 MG tablet Take by mouth.   07/30/2015 at Unknown time  . vitamin B-12 (CYANOCOBALAMIN) 100 MCG tablet Take 100 mcg by mouth daily.   Past Week at Unknown time  . calcium carbonate 1250 MG  capsule Take 1,250 mg by mouth 2 (two) times daily with a meal.   07/30/2015 at 2300  . cholecalciferol (VITAMIN D) 400 UNITS TABS tablet Take 400 Units by mouth.   Not Taking at Unknown time  . Omega-3 Fatty Acids (FISH OIL) 1000 MG CAPS Take by mouth.   Not Taking at Unknown time  . UNABLE TO FIND    Not Taking at Unknown time     Allergies  Allergen Reactions  . Ciprofloxacin   . Morphine And Related      Past Medical History  Diagnosis Date  . Skin cancer   . Breast cancer ([place])   . Fibromyalgia   . Liver cyst   . CKD (chronic kidney disease)   . Hypercholesterolemia   . Sleep apnea   . Anemia   . DJD (degenerative joint disease)   . Hypertension   . Osteopenia   . Elbow injury   . PPD positive     Review of systems:      Physical Exam    Heart and lungs: Regular rate and rhythm without rub or gallop, lungs are bilaterally clear    HEENT: Normocephalic atraumatic eyes are anicteric    Other:     Pertinant exam for procedure: Soft nontender nondistended bowel sounds positive normoactive    Planned proceedures: EGD and indicated procedures I have discussed the risks benefits and complications of procedures to include  not limited to bleeding, infection, perforation and the risk of sedation and the patient wishes to proceed.    Jaclyn Sails, MD Gastroenterology 07/31/2015  8:18 AM

## 2015-07-31 NOTE — Transfer of Care (Signed)
Immediate Anesthesia Transfer of Care Note  Patient: Jaclyn Galvan  Procedure(s) Performed: Procedure(s): ESOPHAGOGASTRODUODENOSCOPY (EGD) WITH PROPOFOL (N/A)  Patient Location:    Anesthesia Type:General  Level of Consciousness: awake, oriented and patient cooperative  Airway & Oxygen Therapy: Patient Spontanous Breathing and Patient connected to nasal cannula oxygen  Post-op Assessment: Report given to RN and Post -op Vital signs reviewed and stable  Post vital signs: Reviewed and stable  Last Vitals:  Filed Vitals:   07/31/15 0913  BP: 144/65  Pulse: 74  Temp: 36.3 C  Resp: 19    Complications: No apparent anesthesia complications

## 2015-08-02 ENCOUNTER — Encounter: Payer: Self-pay | Admitting: Gastroenterology

## 2015-08-02 LAB — SURGICAL PATHOLOGY

## 2015-09-05 ENCOUNTER — Other Ambulatory Visit: Payer: Self-pay | Admitting: Obstetrics and Gynecology

## 2015-09-05 ENCOUNTER — Ambulatory Visit
Admission: RE | Admit: 2015-09-05 | Discharge: 2015-09-05 | Disposition: A | Discharge status: Home / Self Care | Payer: Medicare Other | Source: Ambulatory Visit | Attending: Obstetrics and Gynecology | Admitting: Obstetrics and Gynecology

## 2015-09-05 DIAGNOSIS — Z1231 Encounter for screening mammogram for malignant neoplasm of breast: Secondary | ICD-10-CM | POA: Insufficient documentation

## 2015-09-05 DIAGNOSIS — Z1239 Encounter for other screening for malignant neoplasm of breast: Secondary | ICD-10-CM

## 2015-09-10 ENCOUNTER — Other Ambulatory Visit: Payer: Self-pay | Admitting: Obstetrics and Gynecology

## 2015-09-10 DIAGNOSIS — R921 Mammographic calcification found on diagnostic imaging of breast: Secondary | ICD-10-CM

## 2015-09-18 ENCOUNTER — Ambulatory Visit
Admission: RE | Admit: 2015-09-18 | Discharge: 2015-09-18 | Disposition: A | Discharge status: Home / Self Care | Payer: Medicare Other | Source: Ambulatory Visit | Attending: Obstetrics and Gynecology | Admitting: Obstetrics and Gynecology

## 2015-09-18 DIAGNOSIS — R921 Mammographic calcification found on diagnostic imaging of breast: Secondary | ICD-10-CM | POA: Diagnosis present

## 2015-09-19 ENCOUNTER — Other Ambulatory Visit: Payer: Self-pay | Admitting: Obstetrics and Gynecology

## 2015-09-19 DIAGNOSIS — R921 Mammographic calcification found on diagnostic imaging of breast: Secondary | ICD-10-CM

## 2015-09-25 ENCOUNTER — Ambulatory Visit
Admission: RE | Admit: 2015-09-25 | Discharge: 2015-09-25 | Disposition: A | Discharge status: Home / Self Care | Payer: Medicare Other | Source: Ambulatory Visit | Attending: Obstetrics and Gynecology | Admitting: Obstetrics and Gynecology

## 2015-09-25 DIAGNOSIS — R921 Mammographic calcification found on diagnostic imaging of breast: Secondary | ICD-10-CM | POA: Diagnosis not present

## 2015-09-25 HISTORY — PX: BREAST BIOPSY: SHX20

## 2015-09-27 LAB — SURGICAL PATHOLOGY

## 2016-05-05 ENCOUNTER — Encounter: Payer: Self-pay | Admitting: *Deleted

## 2016-05-06 ENCOUNTER — Encounter: Payer: Self-pay | Admitting: *Deleted

## 2016-05-06 ENCOUNTER — Ambulatory Visit: Payer: Medicare Other | Admitting: Anesthesiology

## 2016-05-06 ENCOUNTER — Encounter
Admission: RE | Disposition: A | Discharge status: Home / Self Care | Payer: Self-pay | Source: Ambulatory Visit | Attending: Gastroenterology

## 2016-05-06 ENCOUNTER — Ambulatory Visit
Admission: RE | Admit: 2016-05-06 | Discharge: 2016-05-06 | Disposition: A | Discharge status: Home / Self Care | Payer: Medicare Other | Source: Ambulatory Visit | Attending: Gastroenterology | Admitting: Gastroenterology

## 2016-05-06 DIAGNOSIS — Z86010 Personal history of colonic polyps: Secondary | ICD-10-CM | POA: Diagnosis not present

## 2016-05-06 DIAGNOSIS — Z85828 Personal history of other malignant neoplasm of skin: Secondary | ICD-10-CM | POA: Insufficient documentation

## 2016-05-06 DIAGNOSIS — M797 Fibromyalgia: Secondary | ICD-10-CM | POA: Insufficient documentation

## 2016-05-06 DIAGNOSIS — Z7982 Long term (current) use of aspirin: Secondary | ICD-10-CM | POA: Diagnosis not present

## 2016-05-06 DIAGNOSIS — K3189 Other diseases of stomach and duodenum: Secondary | ICD-10-CM | POA: Insufficient documentation

## 2016-05-06 DIAGNOSIS — D132 Benign neoplasm of duodenum: Secondary | ICD-10-CM | POA: Diagnosis present

## 2016-05-06 DIAGNOSIS — G4730 Sleep apnea, unspecified: Secondary | ICD-10-CM | POA: Insufficient documentation

## 2016-05-06 DIAGNOSIS — Z79899 Other long term (current) drug therapy: Secondary | ICD-10-CM | POA: Diagnosis not present

## 2016-05-06 DIAGNOSIS — K449 Diaphragmatic hernia without obstruction or gangrene: Secondary | ICD-10-CM | POA: Diagnosis not present

## 2016-05-06 DIAGNOSIS — I129 Hypertensive chronic kidney disease with stage 1 through stage 4 chronic kidney disease, or unspecified chronic kidney disease: Secondary | ICD-10-CM | POA: Insufficient documentation

## 2016-05-06 DIAGNOSIS — E7800 Pure hypercholesterolemia, unspecified: Secondary | ICD-10-CM | POA: Insufficient documentation

## 2016-05-06 DIAGNOSIS — Z862 Personal history of diseases of the blood and blood-forming organs and certain disorders involving the immune mechanism: Secondary | ICD-10-CM | POA: Insufficient documentation

## 2016-05-06 DIAGNOSIS — Z8719 Personal history of other diseases of the digestive system: Secondary | ICD-10-CM | POA: Insufficient documentation

## 2016-05-06 DIAGNOSIS — N189 Chronic kidney disease, unspecified: Secondary | ICD-10-CM | POA: Diagnosis not present

## 2016-05-06 DIAGNOSIS — M8580 Other specified disorders of bone density and structure, unspecified site: Secondary | ICD-10-CM | POA: Diagnosis not present

## 2016-05-06 DIAGNOSIS — M1990 Unspecified osteoarthritis, unspecified site: Secondary | ICD-10-CM | POA: Insufficient documentation

## 2016-05-06 HISTORY — PX: ESOPHAGOGASTRODUODENOSCOPY (EGD) WITH PROPOFOL: SHX5813

## 2016-05-06 SURGERY — ESOPHAGOGASTRODUODENOSCOPY (EGD) WITH PROPOFOL
Anesthesia: General

## 2016-05-06 MED ORDER — PROPOFOL 10 MG/ML IV BOLUS
INTRAVENOUS | Status: DC | PRN
Start: 2016-05-06 — End: 2016-05-06
  Administered 2016-05-06: 70 mg via INTRAVENOUS
  Administered 2016-05-06 (×2): 30 mg via INTRAVENOUS

## 2016-05-06 MED ORDER — SODIUM CHLORIDE 0.9 % IV SOLN: INTRAVENOUS | Status: DC

## 2016-05-06 MED ORDER — SODIUM CHLORIDE 0.9 % IV SOLN
INTRAVENOUS | Status: DC
Start: 2016-05-06 — End: 2016-05-06
  Administered 2016-05-06: 1000 mL via INTRAVENOUS

## 2016-05-06 MED ORDER — PROPOFOL 500 MG/50ML IV EMUL
INTRAVENOUS | Status: DC | PRN
  Administered 2016-05-06: 100 ug/kg/min via INTRAVENOUS

## 2016-05-06 MED ORDER — LIDOCAINE HCL (PF) 2 % IJ SOLN
INTRAMUSCULAR | Status: DC | PRN
  Administered 2016-05-06: 50 mg via INTRADERMAL

## 2016-05-06 NOTE — Transfer of Care (Signed)
Immediate Anesthesia Transfer of Care Note  Patient: Jaclyn Galvan  Procedure(s) Performed: Procedure(s): ESOPHAGOGASTRODUODENOSCOPY (EGD) WITH PROPOFOL (N/A)  Patient Location: PACU  Anesthesia Type:General  Level of Consciousness: awake  Airway & Oxygen Therapy: Patient Spontanous Breathing  Post-op Assessment: Report given to RN and Post -op Vital signs reviewed and stable  Post vital signs: Reviewed and stable  Last Vitals:  Vitals:   05/06/16 1339 05/06/16 1443  BP: (!) 191/82 (!) 181/80  Pulse: 78 74  Resp: 20 16  Temp: 36.5 C 36.2 C    Last Pain:  Vitals:   05/06/16 1443  TempSrc: Tympanic  PainSc:          Complications: No apparent anesthesia complications

## 2016-05-06 NOTE — Op Note (Signed)
[facility] Gastroenterology Patient Name: Jaclyn Galvan Procedure Date: 05/06/2016 2:13 PM MRN: [id number] Account #: [id number] Date of Birth: 1941/01/05 Admit Type: Outpatient Age: 75 Room: [facility] ENDO ROOM 3 Gender: Female Note Status: Finalized Procedure:            Upper GI endoscopy Indications:          Surveillance procedure, follow up duodenal adenoma Providers:            [name], MD Referring MD:         Ramonita Lab, MD (Referring MD) Medicines:            Monitored Anesthesia Care Complications:        No immediate complications. Procedure:            Pre-Anesthesia Assessment:                       - ASA Grade Assessment: III - A patient with severe                        systemic disease.                       After obtaining informed consent, the endoscope was                        passed under direct vision. Throughout the procedure,                        the patient's blood pressure, pulse, and oxygen                        saturations were monitored continuously. The Endoscope                        was introduced through the mouth, and advanced to the                        third part of duodenum. The upper GI endoscopy was                        accomplished without difficulty. The patient tolerated                        the procedure well. Findings:      The Z-line was variable.      There were esophageal mucosal changes consistent with short-segment       Barrett's esophagus present at the gastroesophageal junction. The       maximum longitudinal extent of these mucosal changes was 1 cm in length.       Mucosa was biopsied with a cold forceps for histology.      A medium-sized hiatal hernia was found. The Z-line was a variable       distance from incisors; the hiatal hernia was sliding.      The exam of the esophagus was otherwise normal.      Patchy mild inflammation characterized by congestion (edema), erosions       and  erythema was found in the gastric antrum. Shallow erosion/ulceration       seen on the incisura.      The cardia and gastric fundus were normal on  retroflexion otherwise.      A single 1 mm sessile polyp, consistant with residual adenoma, with no       bleeding was found in the second to third portion of the duodenum. The       polyp was removed with a cold biopsy forceps. Resection and retrieval       were complete.      The exam of the duodenum was otherwise normal. Impression:           - Z-line variable.                       - Esophageal mucosal changes consistent with                        short-segment Barrett's esophagus. Biopsied.                       - Medium-sized hiatal hernia.                       - Gastritis.                       - A single duodenal polyp. Resected and retrieved. Recommendation:       - Use Protonix (pantoprazole) 40 mg PO daily daily.                       - Use sucralfate suspension 1 gram PO BID.                       - Await pathology results. Procedure Code(s):    --- Professional ---                       [phone], Esophagogastroduodenoscopy, flexible, transoral;                        with biopsy, single or multiple CPT copyright 2016 American Medical Association. All rights reserved. The codes documented in this report are preliminary and upon coder review may  be revised to meet current compliance requirements. [name], MD 05/06/2016 2:47:22 PM This report has been signed electronically. Number of Addenda: 0 Note Initiated On: 05/06/2016 2:13 PM      [facility]

## 2016-05-06 NOTE — H&P (Signed)
Outpatient short stay form Pre-procedure 05/06/2016 2:07 PM [name] MD  Primary Physician: Dr. Ramonita Lab  Reason for visit:  EGD  History of present illness:  Patient is a 75 year old female presenting today for EGD. She had a EGD on 09/04/2015 at that time having a duodenal adenoma removed. She is presenting today for recheck in this regard. She also has a history of Barrett's esophagus. The last histologic evaluation of this was 08/02/2015. Patient currently takes no blood thinning agents or aspirin products with the exception of 81 mg aspirin that has been held.    Current Facility-Administered Medications:  .  0.9 %  sodium chloride infusion, , Intravenous, Continuous, [name], MD .  0.9 %  sodium chloride infusion, , Intravenous, Continuous, [name], MD, Last Rate: 20 mL/hr at 05/06/16 1405, 1,000 mL at 05/06/16 1405  Prescriptions Prior to Admission  Medication Sig Dispense Refill Last Dose  . aspirin EC 81 MG tablet Take 81 mg by mouth daily.   07/30/2015 at 2300  . aspirin-acetaminophen-caffeine (EXCEDRIN MIGRAINE) 250-250-65 MG per tablet Take by mouth every 6 (six) hours as needed for headache.   07/30/2015 at 2300  . atorvastatin (LIPITOR) 10 MG tablet Take 5 mg by mouth daily.    05/05/2016 at Unknown time  . Black Cohosh-SoyIsoflav-Magnol (ESTROVEN MENOPAUSE RELIEF) CAPS Take by mouth at bedtime.    05/05/2016 at Unknown time  . DULoxetine (CYMBALTA) 20 MG capsule Take 20 mg by mouth daily.   05/05/2016 at Unknown time  . losartan (COZAAR) 25 MG tablet Take 25 mg by mouth daily.   05/05/2016 at Unknown time  . pantoprazole (PROTONIX) 40 MG tablet Take by mouth.   05/05/2016 at Unknown time  . vitamin B-12 (CYANOCOBALAMIN) 100 MCG tablet Take 100 mcg by mouth daily.   05/05/2016 at Unknown time  . calcium carbonate 1250 MG capsule Take 1,250 mg by mouth 2 (two) times daily with a meal.   Not Taking at Unknown time  . cholecalciferol (VITAMIN D) 400 UNITS  TABS tablet Take 400 Units by mouth.   Not Taking at Unknown time  . co-enzyme Q-10 30 MG capsule Take 30 mg by mouth daily.    Not Taking at Unknown time  . Omega-3 Fatty Acids (FISH OIL) 1000 MG CAPS Take by mouth.   Not Taking at Unknown time  . UNABLE TO FIND    Not Taking at Unknown time     Allergies  Allergen Reactions  . Ciprofloxacin   . Morphine And Related      Past Medical History:  Diagnosis Date  . Anemia   . CKD (chronic kidney disease)   . DJD (degenerative joint disease)   . Elbow injury   . Fibromyalgia   . Hypercholesterolemia   . Hypertension   . Liver cyst   . Osteopenia   . PPD positive   . Skin cancer   . Sleep apnea     Review of systems:      Physical Exam    Heart and lungs: Regular rate and rhythm without rub or gallop, lungs are bilaterally clear.    HEENT: Normal cephalic atraumatic eyes are anicteric    Other:     Pertinant exam for procedure: Soft nontender nondistended bowel sounds positive normoactive.    Planned proceedures: EGD and indicated procedures. I have discussed the risks benefits and complications of procedures to include not limited to bleeding, infection, perforation and the risk of sedation and the  patient wishes to proceed.    [name], MD Gastroenterology 05/06/2016  2:07 PM

## 2016-05-06 NOTE — Anesthesia Postprocedure Evaluation (Signed)
Anesthesia Post Note  Patient: Jaclyn Galvan  Procedure(s) Performed: Procedure(s) (LRB): ESOPHAGOGASTRODUODENOSCOPY (EGD) WITH PROPOFOL (N/A)  Patient location during evaluation: PACU Anesthesia Type: General Level of consciousness: awake and alert Pain management: pain level controlled Vital Signs Assessment: post-procedure vital signs reviewed and stable Respiratory status: spontaneous breathing, nonlabored ventilation and respiratory function stable Cardiovascular status: blood pressure returned to baseline and stable Postop Assessment: no signs of nausea or vomiting Anesthetic complications: no    Last Vitals:  Vitals:   05/06/16 1339 05/06/16 1443  BP: (!) 191/82 (!) 181/80  Pulse: 78 74  Resp: 20 16  Temp: 36.5 C 36.2 C    Last Pain:  Vitals:   05/06/16 1443  TempSrc: Tympanic  PainSc:                  Tallie Hevia

## 2016-05-06 NOTE — Anesthesia Preprocedure Evaluation (Addendum)
Anesthesia Evaluation  Patient identified by MRN, date of birth, ID band Patient awake    Reviewed: Allergy & Precautions, NPO status , Patient's Chart, lab work & pertinent test results  History of Anesthesia Complications Negative for: history of anesthetic complications  Airway Mallampati: III  TM Distance: >3 FB Neck ROM: Full    Dental   Pulmonary sleep apnea and Continuous Positive Airway Pressure Ventilation ,    breath sounds clear to auscultation- rhonchi (-) wheezing      Cardiovascular Exercise Tolerance: Good hypertension, Pt. on medications (-) CAD and (-) Past MI  Rhythm:Regular Rate:Normal - Systolic murmurs and - Diastolic murmurs    Neuro/Psych negative psych ROS   GI/Hepatic negative GI ROS, Neg liver ROS,   Endo/Other  negative endocrine ROSneg diabetes  Renal/GU CRFRenal disease (baseline GFR 50)     Musculoskeletal  (+) Arthritis , Osteoarthritis,  Fibromyalgia -  Abdominal (+) + obese,   Peds  Hematology  (+) anemia ,   Anesthesia Other Findings Past Medical History: No date: Anemia No date: CKD (chronic kidney disease) No date: DJD (degenerative joint disease) No date: Elbow injury No date: Fibromyalgia No date: Hypercholesterolemia No date: Hypertension No date: Liver cyst No date: Osteopenia 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 No date: Skin cancer No date: Sleep apnea   Reproductive/Obstetrics                            Anesthesia Physical Anesthesia Plan  ASA: II  Anesthesia Plan: General   Post-op Pain Management:    Induction: Intravenous  Airway Management Planned: Natural Airway  Additional Equipment:   Intra-op Plan:   Post-operative Plan:   Informed Consent: I have reviewed the patients History and Physical, chart, labs and discussed the procedure including the risks, benefits and alternatives for the proposed anesthesia with the patient or authorized  representative who has indicated his/her understanding and acceptance.     Plan Discussed with: Anesthesiologist  Anesthesia Plan Comments:         Anesthesia Quick Evaluation

## 2016-05-07 ENCOUNTER — Encounter: Payer: Self-pay | Admitting: Gastroenterology

## 2016-05-09 LAB — SURGICAL PATHOLOGY

## 2016-07-21 ENCOUNTER — Ambulatory Visit: Payer: Medicare Other | Admitting: Physical Therapy

## 2016-07-24 ENCOUNTER — Encounter: Payer: Self-pay | Admitting: Physical Therapy

## 2016-07-24 ENCOUNTER — Ambulatory Visit: Payer: Medicare Other | Attending: Internal Medicine | Admitting: Physical Therapy

## 2016-07-24 VITALS — BP 138/80

## 2016-07-24 DIAGNOSIS — M6281 Muscle weakness (generalized): Secondary | ICD-10-CM | POA: Diagnosis present

## 2016-07-24 DIAGNOSIS — R2681 Unsteadiness on feet: Secondary | ICD-10-CM | POA: Diagnosis not present

## 2016-07-24 DIAGNOSIS — R262 Difficulty in walking, not elsewhere classified: Secondary | ICD-10-CM | POA: Insufficient documentation

## 2016-07-24 NOTE — Patient Instructions (Addendum)
Heel Raises    Stand with support. Tighten pelvic floor and hold. With knees straight, raise heels off ground. Hold _1-2_ seconds. Relax for _2-3__ seconds. Repeat _10__ times. Do _2__ times a day.  Copyright  VHI. All rights reserved.  FLEXION: Standing - Stable (Active)    Stand, both feet flat. Lift right knee toward ceiling. Complete _1_ sets of __10_ repetitions. Perform _2__ sessions per day.  http://gtsc.exer.us/29   Copyright  VHI. All rights reserved.  Sit to Stand / Stand to Sit / Transfers    Sit on edge of a solid chair with arms, feet flat on floor. Lean forward over feet and stand up with hands on chair arms. Sit down slowly with hands on chair arms. Repeat __2__ times per session. Do __2__ sessions per day.  Copyright  VHI. All rights reserved.

## 2016-07-24 NOTE — Therapy (Signed)
[place] MAIN [facility] SERVICES [address] [place], [place], [address] Phone: [phone]   Fax:  [phone]  Physical Therapy Evaluation  Patient Details  Name: Jaclyn Galvan MRN: [id number] Date of Birth: [birth year] Referring Provider: Dr. [name]  Encounter Date: 07/24/2016      PT End of Session - 07/24/16 1502    Visit Number 1   Number of Visits 17   Date for PT Re-Evaluation 09/18/16   Authorization Type GCodes 1/10   PT Start Time 1302   PT Stop Time 1358   PT Time Calculation (min) 56 min   Equipment Utilized During Treatment Gait belt   Activity Tolerance Patient tolerated treatment well   Behavior During Therapy [facility] for tasks assessed/performed;Flat affect      Past Medical History:  Diagnosis Date  . Anemia    resolved from the beginning of this year  . CKD (chronic kidney disease)   . 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 Elbow injury   . Fibromyalgia   . Hypercholesterolemia    controlled with meds  . Hypertension    controlled with meds, checks regularly  . Liver cyst   . Osteopenia   . PPD positive   . Skin cancer    recent, resolved  . Sleep apnea     Past Surgical History:  Procedure Laterality Date  . ADENOIDECTOMY    . APPENDECTOMY    . BACK SURGERY    . BILATERAL OOPHORECTOMY Right   . BREAST BIOPSY Right    1988 negative  . BREAST BIOPSY Right 09/25/2015   stereo unknown  . CHOLECYSTECTOMY    . DILATION AND CURETTAGE OF UTERUS  1966  . ESOPHAGOGASTRODUODENOSCOPY (EGD) WITH PROPOFOL N/A 07/31/2015   Procedure: ESOPHAGOGASTRODUODENOSCOPY (EGD) WITH PROPOFOL;  Surgeon: [name], MD;  Location: [facility] ENDOSCOPY;  Service: Endoscopy;  Laterality: N/A;  . ESOPHAGOGASTRODUODENOSCOPY (EGD) WITH PROPOFOL N/A 05/06/2016   Procedure: ESOPHAGOGASTRODUODENOSCOPY (EGD) WITH PROPOFOL;  Surgeon: [name], MD;  Location: [facility] ENDOSCOPY;  Service:  Endoscopy;  Laterality: N/A;  . TONSILLECTOMY    . TOTAL KNEE ARTHROPLASTY Bilateral    2001,2006  . TUBAL LIGATION  1971    Vitals:   07/24/16 1438  BP: 138/80         Subjective Assessment - 07/24/16 1305    Subjective Patient is 75 y.o. female presenting to PT with deficits in balance and gait. She states that she was diagnosed with fibromyalgia ~2 years ago. Since then she has been placed on meds whose side effects make her unsteady. She states she has fallen 3 times in the past 6 months. Her husband notes that these falls mostly occur when she fatigues. The patient notes that she often trips over objects such as thresholds. She reports that she has had no major injuries from any of her past falls other than bruises. The patient and her husband both note that getting up/down from a chair is very difficult and that sudden movements cause her to become more unsafe during the day. She reports that she has received physical therapy previously but not for this issue.    Patient is accompained by: Family member   Pertinent History Factors Affecting Rehab: decreased mobility in her knee, multiple joints with limited ROM, chornic issues for ~2 years   Limitations Walking   How long can you sit comfortably? N/A   How long can you stand comfortably?  1-2 hours   How long can you walk comfortably? very limited, notes she can walk 20-30 minutes with AD until she is unsafe   Patient Stated Goals improve getting up/down from chairs; become more balanced   Currently in Pain? No/denies            [facility] PT Assessment - 07/24/16 0001      Assessment   Medical Diagnosis Gait instability and balance   Referring Provider Dr. [name]   Onset Date/Surgical Date 07/06/14   Hand Dominance Right   Next MD Visit mid November   Prior Therapy Patient and husband report that she has had previous physical therapy after her bilateral knee surgeries in '91 and '96; she has not received any physical therapy  for this issue     Precautions   Precautions None     Restrictions   Weight Bearing Restrictions No     Balance Screen   Has the patient fallen in the past 6 months Yes   How many times? 3   Has the patient had a decrease in activity level because of a fear of falling?  No   Is the patient reluctant to leave their home because of a fear of falling?  No     Home Social worker Private residence   Living Arrangements Spouse/significant other   Available Help at Discharge Family   Type of [place] One level   [place] - 4 wheels;Cane - quad;Grab bars - toilet;Grab bars - tub/shower   Additional Comments Her bed has railings and her toilets are handicapped height.  Patient denies any difficulty navigating her home and states they will be getting a walk in soon     Prior Function   Level of Independence Independent;Independent with household mobility with device   Vocation Retired   Biomedical scientist N/A   Leisure quilt, traveling     Cognition   Overall Cognitive Status Within Functional Limits for tasks assessed     Observation/Other Assessments   Observations Patient has a mildly flat affect, edema in BLE; patient notes that this is her normal amount; no meds for fluid loss   Other Surveys  Other Surveys   Activities of Balance Confidence Scale (ABC Scale)  62.5%, <67% indicates increased risk for falls   Lower Extremity Functional Scale  35/80   The lower the score the greater the disability     Sensation   Light Touch Impaired by gross assessment   Additional Comments Patient has decreased sensation along RLE lateral and distal to knee joint (L4), 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     Coordination   Gross Motor Movements are Fluid and Coordinated No   Fine Motor Movements are Fluid and Coordinated No  RAMPs of BLE are normal but slightly delayed   Finger Nose Finger Test RUE=Normal LUE=mild  dysmetria, consistent tremor throughout movement     Posture/Postural Control   Posture Comments Patient sits with mildly slumped posture with slight forward head, with LE movements she leans post indicating poor LE hip and core strength     ROM / Strength   AROM / PROM / Strength AROM;PROM;Strength     AROM   Overall AROM  Deficits   Overall AROM Comments RUE is WFL; LUE has decreased motion due to pain with A/PROM; BLE cannot achieve full knee flexion, which patient states is due to past B TKAs; will take  measurements of limitation next session     PROM   Overall PROM Comments Patient RUE is limited with PROM into shoulder flexion due to pain; will measure next session     Strength   Strength Assessment Site Shoulder;Elbow;Hip;Knee;Ankle   Right/Left Shoulder Right;Left   Right Shoulder Flexion 4-/5   Right Shoulder Extension 4-/5   Right Shoulder ABduction 3+/5   Left Shoulder Flexion 2+/5   Left Shoulder Extension 4-/5  at 0 degrees (close to body)   Left Shoulder ABduction 2+/5   Right Elbow Flexion 4-/5   Right Elbow Extension 4-/5   Left Elbow Flexion 3+/5  mild tremor with exertion   Left Elbow Extension 3+/5  mild tremor with exertion   Right Hip Flexion 3+/5   Right Hip ABduction 4/5  seated   Right Hip ADduction 4/5  seated   Left Hip Flexion 3+/5   Left Hip ABduction 4/5  seated   Left Hip ADduction 4/5  seated   Right Knee Flexion 4-/5  within patient's limited range   Right Knee Extension 4/5  within patient's limited range   Left Knee Flexion 4-/5   Left Knee Extension 4/5   Right Ankle Dorsiflexion 4-/5   Left Ankle Dorsiflexion 4-/5     Transfers   Five time sit to stand comments  --   Comments Patient appears mildly unsafe during transfers; patient's husband helps wife from chair by pulling on her RUE; with PT patient is able to perform sit to stand with minA to for stability upon standing, and min VCs for foot placement, she needs BUE assist for  sitting/standing; she is unable to place feet fully underneath her     Ambulation/Gait   Gait Comments Uses rollater; wider base of support, slow gait speed, decrease step length     Standardized Balance Assessment   Five times sit to stand comments  42.6 seconds, 2 sit to stands; very unstable due to fear of chair not being present;   10 Meter Walk 0.73m/s, CGA with rollator,indicates increased risk for falls, patient is more likely to be hospitalized, and patient is dependent in ADL's and IADL's     High Level Balance   High Level Balance Comments Feet apart no UE support, patient stable, feet closer together eyes open: mild increase in sway; eyes closed: increased sway, CGA for safety; Wide tandem stance patient has increased difficulty with LLE leading, mild unsteadiness     Treatment: Initiated HEP  Sit to stand x2, CGA, instructed to perform 2 xday, at dining room chairs that are solid with arms and to keep table in front of her for UE support once standing prn. Standing marches, x10, at // bars, 2 HHA, CGA for safety Standing Heel raises, x10, at // bars, 2 HHA, CGA for safety Instructed patient to perform in corner with chair in front for safety or at sink constantly holding on. Instructed patient to not put herself in an unsafe positiong and that if she felt unsafe at any time during these exercises to d/c.                       PT Education - 07/24/16 1501    Education provided Yes   Education Details HEP initiated, POC   Person(s) Educated Patient;Spouse   Methods Explanation;Handout;Demonstration;Verbal cues   Comprehension Verbalized understanding;Returned demonstration;Verbal cues required             PT Long Term Goals - 07/24/16 1512  PT LONG TERM GOAL #1   Title Patient will be independent in HEP in order to increase patient's ability to maintain gains achieved in therapy and assist with return to PLOF.    Time 8   Period Weeks   Status  New     PT LONG TERM GOAL #2   Title Patient will be able to perform 5x sit to stand with BUE in a safe manner in under 1 minute in order to perform transfers at supervision level.   Time 8   Period Weeks   Status New     PT LONG TERM GOAL #3   Title Patient will increase her 10 m walk test time to greater than or equal to 0.75 m/s in order to be a community ambulator at decreased risk for falls.    Time 8   Period Weeks   Status New     PT LONG TERM GOAL #4   Title Patient will increase Berg balance score to >45/56 in order to decrease patient's risk for falls during functional activities.    Time 8   Period Weeks   Status New     PT LONG TERM GOAL #5   Title Patient will be able to negotiate one 4" curb with min. UE support independently in a safe manner in order to allow for improved and safe community ambulation.    Time 8   Period Weeks   Status New               Plan - 07/24/16 1503    Clinical Impression Statement Patient is a 75 y.o. female who presents to PT with balance deficits and muscle weakness. The patient requires minA during sit to stands to maintain stability when moving into standing, requires heavy BUE use for sitting/standing, and is unable to perform the exercise well. The patient ambulates with a rollator for safety. She is at risk for falls based on her 10 meter walk test, a 2x sit to stand taking 46.2 seconds, and her ABC scale. She demonstrates mild LE weakness and increase LUE weakness and pain. She is unable to attain full P/AROM in LUE due to pain, which her husband and her note has been a while but is undiagnosed. She also has limited active B knee flexion, which she notes is from her past B TKAs. This increases her difficulty in performing sit to stands. The patient would benefit from skilled PT in order to address general muscle weakness, dynamic/statice balance, and improve her safety with mobility.    Rehab Potential Fair   Clinical Impairments  Affecting Rehab Potential Negative Factors: Chronicity of issue, frequent falls, multiple joints with limited motion; Positive Factors: supportive husband, motivated; Clinical Impression: Evolving-multiple falls and mutliple co-morbidities affecting rehab   PT Frequency 2x / week   PT Duration 8 weeks   PT Treatment/Interventions ADLs/Self Care Home Management;Aquatic Therapy;Biofeedback;Canalith Repostioning;Cryotherapy;Electrical Stimulation;Moist Heat;DME Instruction;Gait training;Stair training;Functional mobility training;Therapeutic activities;Therapeutic exercise;Balance training;Neuromuscular re-education;Patient/family education;Manual techniques;Passive range of motion;Energy conservation   PT Next Visit Plan measure shoulder and B knee ROM, Berg, LE strengthening, balance activities,   PT Home Exercise Plan HEP initiated-see patient instructions   Consulted and Agree with Plan of Care Patient;Family member/caregiver      Patient will benefit from skilled therapeutic intervention in order to improve the following deficits and impairments:  Abnormal gait, Decreased activity tolerance, Decreased balance, Decreased coordination, Decreased endurance, Decreased mobility, Decreased range of motion, Decreased safety awareness, Decreased strength, Hypomobility, Increased edema,  Impaired flexibility, Impaired sensation, Impaired UE functional use, Improper body mechanics, Postural dysfunction, Pain, Obesity  Visit Diagnosis: Unsteadiness on feet  Muscle weakness (generalized)  Difficulty in walking, not elsewhere classified      G-Codes - 31-Jul-2016 1532    Functional Assessment Tool Used sit<>stand, 10MWT, LEFS, ABC scale, Clinical Judgement   Functional Limitation Mobility: Walking and moving around   Mobility: Walking and Moving Around Current Status [phone]) At least 40 percent but less than 60 percent impaired, limited or restricted   Mobility: Walking and Moving Around Goal Status  [phone]) At least 20 percent but less than 40 percent impaired, limited or restricted       Problem List Patient Active Problem List   Diagnosis Date Noted  . Absolute anemia 06/26/2015  . A-fib ([place]) 06/26/2015  . Hepatic cyst 06/26/2015  . BP (high blood pressure) 06/26/2015  . HLD (hyperlipidemia) 06/26/2015  . H/O: osteoarthritis 06/26/2015  . Arthritis, degenerative 06/26/2015  . Chronic kidney disease (CKD), stage III (moderate) 06/26/2015  . Osteopenia 06/26/2015  . Obstructive apnea 04/19/2015   Tilman Neat, SPT  This entire session was performed under direct supervision and direction of a licensed therapist/therapist assistant . I have personally read, edited and approve of the note as written.  Collie Siad PT, DPT 2016-07-31, 3:35 PM  [place] MAIN [facility] SERVICES [address] [place], [place], [address] Phone: [phone]   Fax:  [phone]  Name: Jaclyn Galvan MRN: [id number] Date of Birth: Sep 28, 1941

## 2016-07-29 ENCOUNTER — Encounter: Payer: Self-pay | Admitting: Physical Therapy

## 2016-07-29 ENCOUNTER — Ambulatory Visit: Payer: Medicare Other | Admitting: Physical Therapy

## 2016-07-29 DIAGNOSIS — M6281 Muscle weakness (generalized): Secondary | ICD-10-CM

## 2016-07-29 DIAGNOSIS — R2681 Unsteadiness on feet: Secondary | ICD-10-CM | POA: Diagnosis not present

## 2016-07-29 DIAGNOSIS — R262 Difficulty in walking, not elsewhere classified: Secondary | ICD-10-CM

## 2016-07-29 NOTE — Patient Instructions (Addendum)
   Band Walk: Side Stepping    Tie band around legs, just above knees. Step _10__ feet to one side, then step back to start. Repeat __2_ times per session. Note: Small towel between band and skin eases rubbing.  http://plyo.exer.us/76   Copyright  VHI. All rights reserved.

## 2016-07-29 NOTE — Therapy (Signed)
[place] MAIN [facility] SERVICES [address] [place], [place], [address] Phone: [phone]   Fax:  [phone]  Physical Therapy Treatment  Patient Details  Name: Jaclyn Galvan MRN: [id number] Date of Birth: [birth year] Referring Provider: Dr. [name]  Encounter Date: 07/29/2016      PT End of Session - 07/29/16 1711    Visit Number 2   Number of Visits 17   Date for PT Re-Evaluation 09/18/16   Authorization Type GCodes 2/10   PT Start Time 1446   PT Stop Time 1528   PT Time Calculation (min) 42 min   Equipment Utilized During Treatment Gait belt   Activity Tolerance Patient tolerated treatment well   Behavior During Therapy [facility] for tasks assessed/performed;Flat affect      Past Medical History:  Diagnosis Date  . Anemia    resolved from the beginning of this year  . CKD (chronic kidney disease)   . 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 Elbow injury   . Fibromyalgia   . Hypercholesterolemia    controlled with meds  . Hypertension    controlled with meds, checks regularly  . Liver cyst   . Osteopenia   . PPD positive   . Skin cancer    recent, resolved  . Sleep apnea     Past Surgical History:  Procedure Laterality Date  . ADENOIDECTOMY    . APPENDECTOMY    . BACK SURGERY    . BILATERAL OOPHORECTOMY Right   . BREAST BIOPSY Right    1988 negative  . BREAST BIOPSY Right 09/25/2015   stereo unknown  . CHOLECYSTECTOMY    . DILATION AND CURETTAGE OF UTERUS  1966  . ESOPHAGOGASTRODUODENOSCOPY (EGD) WITH PROPOFOL N/A 07/31/2015   Procedure: ESOPHAGOGASTRODUODENOSCOPY (EGD) WITH PROPOFOL;  Surgeon: [name], MD;  Location: [facility] ENDOSCOPY;  Service: Endoscopy;  Laterality: N/A;  . ESOPHAGOGASTRODUODENOSCOPY (EGD) WITH PROPOFOL N/A 05/06/2016   Procedure: ESOPHAGOGASTRODUODENOSCOPY (EGD) WITH PROPOFOL;  Surgeon: [name], MD;  Location: [facility] ENDOSCOPY;  Service:  Endoscopy;  Laterality: N/A;  . TONSILLECTOMY    . TOTAL KNEE ARTHROPLASTY Bilateral    2001,2006  . TUBAL LIGATION  1971    There were no vitals filed for this visit.      Subjective Assessment - 07/29/16 1448    Subjective Pt notes that the sit to stand exercises are good but the other HEP is easy. She states she has been able to perform HEP consistently   Patient is accompained by: Family member   Pertinent History Factors Affecting Rehab: decreased mobility in her knee, multiple joints with limited ROM, chornic issues for ~2 years   Limitations Walking   How long can you sit comfortably? N/A   How long can you stand comfortably? 1-2 hours   How long can you walk comfortably? very limited, notes she can walk 20-30 minutes with AD until she is unsafe   Patient Stated Goals improve getting up/down from chairs; become more balanced   Currently in Pain? No/denies         Treatment:  NuStep, L2 x3 min.; 1 min history, 1 min unbilled Berg balance assessed; patient is at higher risk for falls with 38/56 Marches on firm surface with decreasing UE support, with 1# ankle weights x10, increased to 2# ankle weights, CGA for safety, instructed to perform at home and decrease UE support as is safe. Backwards walking in // bars,  x4, min VCs to decrease UE support, 0-2 HHA, CGA-minA for stability Lateral walking with red tband resistance, x6 in // bars, min VCs to maintain feet facing forward and perform larger steps. Measured knee flexion in sitting RLE is ~96-100 degrees, and LLE is ~105-110 degrees into flexion, unable to attain more motion Sit to stands, 2x5, with 2.2#  weighted ball into RUE flexion, min VCs to gain balance before performing RUE weighted ball movements Static stance on half bolster, flat side up, 3x30 seconds, min VCs to maintain upright position, and how balance activities worked. Forward tapping on BOSU ball, light 2 finger assist, CGA-minA for stability, min VCs to  keep UE support and improve hip/knee flexion when performing.                         PT Education - 07/29/16 1449    Education provided Yes   Education Details HEP progressed   Person(s) Educated Patient   Methods Explanation;Handout;Verbal cues   Comprehension Verbalized understanding;Verbal cues required             PT Long Term Goals - 07/24/16 1512      PT LONG TERM GOAL #1   Title Patient will be independent in HEP in order to increase patient's ability to maintain gains achieved in therapy and assist with return to PLOF.    Time 8   Period Weeks   Status New     PT LONG TERM GOAL #2   Title Patient will be able to perform 5x sit to stand with BUE in a safe manner in under 1 minute in order to perform transfers at supervision level.   Time 8   Period Weeks   Status New     PT LONG TERM GOAL #3   Title Patient will increase her 10 m walk test time to greater than or equal to 0.75 m/s in order to be a community ambulator at decreased risk for falls.    Time 8   Period Weeks   Status New     PT LONG TERM GOAL #4   Title Patient will increase Berg balance score to >45/56 in order to decrease patient's risk for falls during functional activities.    Time 8   Period Weeks   Status New     PT LONG TERM GOAL #5   Title Patient will be able to negotiate one 4" curb with min. UE support independently in a safe manner in order to allow for improved and safe community ambulation.    Time 8   Period Weeks   Status New               Plan - 07/29/16 1712    Clinical Impression Statement Pt demonstrates improved safety during sit to stands with CGA during movement and very little verbal cues for pre activity positioning. Her Berg balance score was 38/56 indicating she's at an increased risk for falls. She was able to tolerate the progression of exercises and balance training. She requires minA and min VCs for correct exercise performance. Her HEP  was progressed to marches with 2# ankle weights and lateral walking with red tband. Patient would continue to benefit from skilled TP in order to address BLE weakness, safety with mobility, and balance.   Rehab Potential Fair   Clinical Impairments Affecting Rehab Potential Negative Factors: Chronicity of issue, frequent falls, multiple joints with limited motion; Positive Factors: supportive husband, motivated; Clinical  Impression: Evolving-multiple falls and mutliple co-morbidities affecting rehab   PT Frequency 2x / week   PT Duration 8 weeks   PT Treatment/Interventions ADLs/Self Care Home Management;Aquatic Therapy;Biofeedback;Canalith Repostioning;Cryotherapy;Electrical Stimulation;Moist Heat;DME Instruction;Gait training;Stair training;Functional mobility training;Therapeutic activities;Therapeutic exercise;Balance training;Neuromuscular re-education;Patient/family education;Manual techniques;Passive range of motion;Energy conservation   PT Next Visit Plan measure shoulder and B knee ROM, Berg, LE strengthening, balance activities,   PT Home Exercise Plan HEP progressed-see patient instructions   Consulted and Agree with Plan of Care Patient;Family member/caregiver      Patient will benefit from skilled therapeutic intervention in order to improve the following deficits and impairments:  Abnormal gait, Decreased activity tolerance, Decreased balance, Decreased coordination, Decreased endurance, Decreased mobility, Decreased range of motion, Decreased safety awareness, Decreased strength, Hypomobility, Increased edema, Impaired flexibility, Impaired sensation, Impaired UE functional use, Improper body mechanics, Postural dysfunction, Pain, Obesity  Visit Diagnosis: Unsteadiness on feet  Muscle weakness (generalized)  Difficulty in walking, not elsewhere classified     Problem List Patient Active Problem List   Diagnosis Date Noted  . Absolute anemia 06/26/2015  . A-fib ([place])  06/26/2015  . Hepatic cyst 06/26/2015  . BP (high blood pressure) 06/26/2015  . HLD (hyperlipidemia) 06/26/2015  . H/O: osteoarthritis 06/26/2015  . Arthritis, degenerative 06/26/2015  . Chronic kidney disease (CKD), stage III (moderate) 06/26/2015  . Osteopenia 06/26/2015  . Obstructive apnea 04/19/2015   Tilman Neat, SPT This entire session was performed under direct supervision and direction of a licensed therapist/therapist assistant . I have personally read, edited and approve of the note as written.  Trotter,Margaret PT, DPT 07/30/2016, 8:58 AM   [place] MAIN [facility] SERVICES [address] [place], [place], [address] Phone: [phone]   Fax:  [phone]  Name: TYONA SANTIAGO MRN: [id number] Date of Birth: May 27, 1941

## 2016-07-31 ENCOUNTER — Encounter: Payer: Medicare Other | Admitting: Physical Therapy

## 2016-07-31 ENCOUNTER — Ambulatory Visit: Payer: Medicare Other | Admitting: Physical Therapy

## 2016-07-31 DIAGNOSIS — R262 Difficulty in walking, not elsewhere classified: Secondary | ICD-10-CM

## 2016-07-31 DIAGNOSIS — R2681 Unsteadiness on feet: Secondary | ICD-10-CM | POA: Diagnosis not present

## 2016-07-31 DIAGNOSIS — M6281 Muscle weakness (generalized): Secondary | ICD-10-CM

## 2016-07-31 NOTE — Patient Instructions (Addendum)
   Backward Walking    Walk backward, toes of each foot coming down first. Take long, even strides. Make sure you have a clear pathway with no obstructions when you do this.   Copyright  VHI. All rights reserved.  Tandem Stance    Right foot in front of left, heel touching toe both feet "straight ahead". Stand on Foot Triangle of Support with both feet. Balance in this position _15-20__ seconds. Do with left foot in front of right.  Copyright  VHI. All rights reserved.

## 2016-07-31 NOTE — Therapy (Signed)
[place] MAIN [facility] SERVICES [address] [place], [place], [address] Phone: [phone]   Fax:  [phone]  Physical Therapy Treatment  Patient Details  Name: Jaclyn Galvan MRN: [id number] Date of Birth: May 26, 1941 Referring Provider: Dr. [name]  Encounter Date: 07/31/2016      PT End of Session - 07/31/16 1354    Visit Number 3   Number of Visits 17   Date for PT Re-Evaluation 09/18/16   Authorization Type GCodes 3/10   PT Start Time 1301   PT Stop Time 1348   PT Time Calculation (min) 47 min   Equipment Utilized During Treatment Gait belt   Activity Tolerance Patient tolerated treatment well   Behavior During Therapy [facility] for tasks assessed/performed;Flat affect      Past Medical History:  Diagnosis Date  . Anemia    resolved from the beginning of this year  . CKD (chronic kidney disease)   . 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 Elbow injury   . Fibromyalgia   . Hypercholesterolemia    controlled with meds  . Hypertension    controlled with meds, checks regularly  . Liver cyst   . Osteopenia   . PPD positive   . Skin cancer    recent, resolved  . Sleep apnea     Past Surgical History:  Procedure Laterality Date  . ADENOIDECTOMY    . APPENDECTOMY    . BACK SURGERY    . BILATERAL OOPHORECTOMY Right   . BREAST BIOPSY Right    1988 negative  . BREAST BIOPSY Right 09/25/2015   stereo unknown  . CHOLECYSTECTOMY    . DILATION AND CURETTAGE OF UTERUS  1966  . ESOPHAGOGASTRODUODENOSCOPY (EGD) WITH PROPOFOL N/A 07/31/2015   Procedure: ESOPHAGOGASTRODUODENOSCOPY (EGD) WITH PROPOFOL;  Surgeon: [name], MD;  Location: [facility] ENDOSCOPY;  Service: Endoscopy;  Laterality: N/A;  . ESOPHAGOGASTRODUODENOSCOPY (EGD) WITH PROPOFOL N/A 05/06/2016   Procedure: ESOPHAGOGASTRODUODENOSCOPY (EGD) WITH PROPOFOL;  Surgeon: [name], MD;  Location: [facility] ENDOSCOPY;  Service:  Endoscopy;  Laterality: N/A;  . TONSILLECTOMY    . TOTAL KNEE ARTHROPLASTY Bilateral    2001,2006  . TUBAL LIGATION  1971    There were no vitals filed for this visit.      Subjective Assessment - 07/31/16 1306    Subjective Patient notes that her HEP is still going well. She states she has been able to perform at least 1 xday. Denies falls   Patient is accompained by: Family member   Pertinent History Factors Affecting Rehab: decreased mobility in her knee, multiple joints with limited ROM, chornic issues for ~2 years   Limitations Walking   How long can you sit comfortably? N/A   How long can you stand comfortably? 1-2 hours   How long can you walk comfortably? very limited, notes she can walk 20-30 minutes with AD until she is unsafe   Patient Stated Goals improve getting up/down from chairs; become more balanced   Currently in Pain? No/denies     Treatment:  TherEx: NuStep L3 x3 minutes, unbilled Sit to stand with 2.5# bar weight push/pulls, min VCs to move head forward over toes due to patient losing balance posterior x3, minA from SPT to slowly lower to mat during LOB. Lateral lunges on BOSU ball, x10 each LE, 0-1 HHA, min VCs for improved foot positioning in order to address glut med, CGA-minA for stability Wall squats, 3x10,  CGA for safety, 0 HHA, min VCs to improve equal weight shift during activity.   Neuromuscular Re-ed: Forward tapping on BOSU ball, 3x10, instructed to decrease UE support as more comfortable, first set with 1 HHA, last 2 sets with no HHA unless becoming unsteady, minA for stability Horizontal head turns 2x10 meters, 1 set with rollator, 1 set without rollator, CGA for safety, performed well without shifts from midline Static balance on airex pad, feet together with vertical head turns, 2x10, minA for stability, 0-1 HHA depending on balance Marches on airex pad, 2x10, 0-1 HHA depending on balance, minA for stability Static wide tandem stance, x15  seconds each LE leading, min VCs for positioning with back to wall and chair in front, CGA for safety, no HHA; given in HEP Backwards walking with 1 UE on wall, 2x5 feet, CGA for safety, min VCs to bend forward at the hips slightly and increase step length; given in HEP                              PT Education - 07/31/16 1354    Education provided Yes   Education Details HEP progressed, muscle soreness   Person(s) Educated Patient   Methods Explanation   Comprehension Verbalized understanding             PT Long Term Goals - 07/24/16 1512      PT LONG TERM GOAL #1   Title Patient will be independent in HEP in order to increase patient's ability to maintain gains achieved in therapy and assist with return to PLOF.    Time 8   Period Weeks   Status New     PT LONG TERM GOAL #2   Title Patient will be able to perform 5x sit to stand with BUE in a safe manner in under 1 minute in order to perform transfers at supervision level.   Time 8   Period Weeks   Status New     PT LONG TERM GOAL #3   Title Patient will increase her 10 m walk test time to greater than or equal to 0.75 m/s in order to be a community ambulator at decreased risk for falls.    Time 8   Period Weeks   Status New     PT LONG TERM GOAL #4   Title Patient will increase Berg balance score to >45/56 in order to decrease patient's risk for falls during functional activities.    Time 8   Period Weeks   Status New     PT LONG TERM GOAL #5   Title Patient will be able to negotiate one 4" curb with min. UE support independently in a safe manner in order to allow for improved and safe community ambulation.    Time 8   Period Weeks   Status New               Plan - 07/31/16 1355    Clinical Impression Statement Pt continue to demonstrate improved safety during transfers; however from chair without arms she requires minA from SPT to stand and to maintain safety once in standing.  She demonstrates difficulty with static and dynamic balance requiring minA during activities especially when patient is attempted to stand on one leg. She requires min VCs to perform exercises with better technique. The patient would continue to benefit from skilled PT in order to address balance, safety with mobility, BLE strengthening, and  improve her ability to perform ADLs safely.   Rehab Potential Fair   Clinical Impairments Affecting Rehab Potential Negative Factors: Chronicity of issue, frequent falls, multiple joints with limited motion; Positive Factors: supportive husband, motivated; Clinical Impression: Evolving-multiple falls and mutliple co-morbidities affecting rehab   PT Frequency 2x / week   PT Duration 8 weeks   PT Treatment/Interventions ADLs/Self Care Home Management;Aquatic Therapy;Biofeedback;Canalith Repostioning;Cryotherapy;Electrical Stimulation;Moist Heat;DME Instruction;Gait training;Stair training;Functional mobility training;Therapeutic activities;Therapeutic exercise;Balance training;Neuromuscular re-education;Patient/family education;Manual techniques;Passive range of motion;Energy conservation   PT Next Visit Plan LE strengthening, balance activities, glut med strengthening with lateral step ups   PT Home Exercise Plan HEP progressed-see patient instructions   Consulted and Agree with Plan of Care Patient;Family member/caregiver      Patient will benefit from skilled therapeutic intervention in order to improve the following deficits and impairments:  Abnormal gait, Decreased activity tolerance, Decreased balance, Decreased coordination, Decreased endurance, Decreased mobility, Decreased range of motion, Decreased safety awareness, Decreased strength, Hypomobility, Increased edema, Impaired flexibility, Impaired sensation, Impaired UE functional use, Improper body mechanics, Postural dysfunction, Pain, Obesity  Visit Diagnosis: Unsteadiness on feet  Muscle weakness  (generalized)  Difficulty in walking, not elsewhere classified     Problem List Patient Active Problem List   Diagnosis Date Noted  . Absolute anemia 06/26/2015  . A-fib ([place]) 06/26/2015  . Hepatic cyst 06/26/2015  . BP (high blood pressure) 06/26/2015  . HLD (hyperlipidemia) 06/26/2015  . H/O: osteoarthritis 06/26/2015  . Arthritis, degenerative 06/26/2015  . Chronic kidney disease (CKD), stage III (moderate) 06/26/2015  . Osteopenia 06/26/2015  . Obstructive apnea 04/19/2015   Tilman Neat, SPT This entire session was performed under direct supervision and direction of a licensed therapist/therapist assistant . I have personally read, edited and approve of the note as written.  Trotter,Margaret PT, DPT 07/31/2016, 3:21 PM  [place] MAIN [facility] SERVICES [address] [place], [place], [address] Phone: [phone]   Fax:  [phone]  Name: Jaclyn Galvan MRN: [id number] Date of Birth: 1940/10/07

## 2016-08-05 ENCOUNTER — Encounter: Payer: Self-pay | Admitting: Physical Therapy

## 2016-08-05 ENCOUNTER — Ambulatory Visit: Payer: Medicare Other | Admitting: Physical Therapy

## 2016-08-05 DIAGNOSIS — M6281 Muscle weakness (generalized): Secondary | ICD-10-CM

## 2016-08-05 DIAGNOSIS — R2681 Unsteadiness on feet: Secondary | ICD-10-CM | POA: Diagnosis not present

## 2016-08-05 DIAGNOSIS — R262 Difficulty in walking, not elsewhere classified: Secondary | ICD-10-CM

## 2016-08-05 NOTE — Therapy (Signed)
[place] MAIN [facility] SERVICES [address] [place], [place], [address] Phone: [phone]   Fax:  [phone]  Physical Therapy Treatment  Patient Details  Name: Jaclyn Galvan MRN: [id number] Date of Birth: January 17, 1941 Referring Provider: Dr. [name]  Encounter Date: 08/05/2016      PT End of Session - 08/05/16 1334    Visit Number 4   Number of Visits 17   Date for PT Re-Evaluation 09/18/16   Authorization Type GCodes 4/10   PT Start Time 1115   PT Stop Time 1200   PT Time Calculation (min) 45 min   Equipment Utilized During Treatment Gait belt   Activity Tolerance Patient tolerated treatment well   Behavior During Therapy [facility] for tasks assessed/performed;Flat affect      Past Medical History:  Diagnosis Date  . Anemia    resolved from the beginning of this year  . CKD (chronic kidney disease)   . 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 Elbow injury   . Fibromyalgia   . Hypercholesterolemia    controlled with meds  . Hypertension    controlled with meds, checks regularly  . Liver cyst   . Osteopenia   . PPD positive   . Skin cancer    recent, resolved  . Sleep apnea     Past Surgical History:  Procedure Laterality Date  . ADENOIDECTOMY    . APPENDECTOMY    . BACK SURGERY    . BILATERAL OOPHORECTOMY Right   . BREAST BIOPSY Right    1988 negative  . BREAST BIOPSY Right 09/25/2015   stereo unknown  . CHOLECYSTECTOMY    . DILATION AND CURETTAGE OF UTERUS  1966  . ESOPHAGOGASTRODUODENOSCOPY (EGD) WITH PROPOFOL N/A 07/31/2015   Procedure: ESOPHAGOGASTRODUODENOSCOPY (EGD) WITH PROPOFOL;  Surgeon: [name], MD;  Location: [facility] ENDOSCOPY;  Service: Endoscopy;  Laterality: N/A;  . ESOPHAGOGASTRODUODENOSCOPY (EGD) WITH PROPOFOL N/A 05/06/2016   Procedure: ESOPHAGOGASTRODUODENOSCOPY (EGD) WITH PROPOFOL;  Surgeon: [name], MD;  Location: [facility] ENDOSCOPY;  Service:  Endoscopy;  Laterality: N/A;  . TONSILLECTOMY    . TOTAL KNEE ARTHROPLASTY Bilateral    2001,2006  . TUBAL LIGATION  1971    There were no vitals filed for this visit.      Subjective Assessment - 08/05/16 1118    Subjective Patient states her HEP is going well. Denies falls. Patient notes her BP has been consistent with no symptoms. Her next MD visit is 08/26/16.   Patient is accompained by: Family member   Pertinent History Factors Affecting Rehab: decreased mobility in her knee, multiple joints with limited ROM, chornic issues for ~2 years   Limitations Walking   How long can you sit comfortably? N/A   How long can you stand comfortably? 1-2 hours   How long can you walk comfortably? very limited, notes she can walk 20-30 minutes with AD until she is unsafe   Patient Stated Goals improve getting up/down from chairs; become more balanced   Currently in Pain? No/denies      Treatment: NuStep, BLE only, L3 x 3 minutes; unbilled Wall squats, 2x10, min VCs for foot placement and to increase squat but not allow knees to move over toes Forward taps on dots, 2x10, min VCs to decrease UE support and contract glut muscle; 0-1 HHA, CGA-minA for stability Hip ABD with red tband resitance x10 each LE, increased to green tband resistance x10, patient notes  mild discomfort due to tightness of band; min VCs for upright positioning and to maintain foot in plane to address glut med, 2 HHA Hip extension on Matrix, 5#, with 2 HHA, min VCs to maintain upright posture and control weight eccentrically Tandem walking, x4 in // bars, 1-2 HHA, minA for stability, very difficulty Tandem static stance, firm surface, 2x20 sec, 0-1 HHA depending on balance; min VCs to contract glut muscles, minA for stability Tilt board taps ant/post and lateral; 2x10, no HHA, instructed to hold static in middle portion, minA for stability, min VCs to maintain position Feet together on airex pad, vertical and horizontal head  movements, x10 each, CGA-minA for stability, no HHA Feet together on airex with RUE reaches 5x4 cards, CGA for safety, no HHA, min VCs to maintain feet together and decrease UE support Forward step ups, 2x5 each LE, 1-2 HHA, min VCs to decrease UE but very difficult for patient, CGA for safety Marches on airex pad, 2x10, 0-1 HHA, minA for safety, min VCs to use light 1 finger support but increase B hip flexion                           PT Education - 08/05/16 1334    Education provided Yes   Education Details continuing with HEP   Person(s) Educated Patient   Methods Explanation   Comprehension Verbalized understanding             PT Long Term Goals - 07/24/16 1512      PT LONG TERM GOAL #1   Title Patient will be independent in HEP in order to increase patient's ability to maintain gains achieved in therapy and assist with return to PLOF.    Time 8   Period Weeks   Status New     PT LONG TERM GOAL #2   Title Patient will be able to perform 5x sit to stand with BUE in a safe manner in under 1 minute in order to perform transfers at supervision level.   Time 8   Period Weeks   Status New     PT LONG TERM GOAL #3   Title Patient will increase her 10 m walk test time to greater than or equal to 0.75 m/s in order to be a community ambulator at decreased risk for falls.    Time 8   Period Weeks   Status New     PT LONG TERM GOAL #4   Title Patient will increase Berg balance score to >45/56 in order to decrease patient's risk for falls during functional activities.    Time 8   Period Weeks   Status New     PT LONG TERM GOAL #5   Title Patient will be able to negotiate one 4" curb with min. UE support independently in a safe manner in order to allow for improved and safe community ambulation.    Time 8   Period Weeks   Status New               Plan - 08/05/16 1335    Clinical Impression Statement Pt notes that sit to stand transfers are  becoming less difficult. She is able to perform sit to stand with use of BUEs but not physical assistance. She requires CGA-minA during activities in order to maintain stability especially during balance activities. She requires min VCs to improve performance and decrease UE support.  Patient would continue to benefit from  skilled PT in order to address BLE weakness, balance deficits, and improve safety with mobility.    Rehab Potential Fair   Clinical Impairments Affecting Rehab Potential Negative Factors: Chronicity of issue, frequent falls, multiple joints with limited motion; Positive Factors: supportive husband, motivated; Clinical Impression: Evolving-multiple falls and mutliple co-morbidities affecting rehab   PT Frequency 2x / week   PT Duration 8 weeks   PT Treatment/Interventions ADLs/Self Care Home Management;Aquatic Therapy;Biofeedback;Canalith Repostioning;Cryotherapy;Electrical Stimulation;Moist Heat;DME Instruction;Gait training;Stair training;Functional mobility training;Therapeutic activities;Therapeutic exercise;Balance training;Neuromuscular re-education;Patient/family education;Manual techniques;Passive range of motion;Energy conservation   PT Next Visit Plan LE strengthening, balance activities, glut med strengthening with lateral step ups   PT Home Exercise Plan HEP progressed-see patient instructions   Consulted and Agree with Plan of Care Patient;Family member/caregiver      Patient will benefit from skilled therapeutic intervention in order to improve the following deficits and impairments:  Abnormal gait, Decreased activity tolerance, Decreased balance, Decreased coordination, Decreased endurance, Decreased mobility, Decreased range of motion, Decreased safety awareness, Decreased strength, Hypomobility, Increased edema, Impaired flexibility, Impaired sensation, Impaired UE functional use, Improper body mechanics, Postural dysfunction, Pain, Obesity  Visit  Diagnosis: Unsteadiness on feet  Muscle weakness (generalized)  Difficulty in walking, not elsewhere classified     Problem List Patient Active Problem List   Diagnosis Date Noted  . Absolute anemia 06/26/2015  . A-fib ([place]) 06/26/2015  . Hepatic cyst 06/26/2015  . BP (high blood pressure) 06/26/2015  . HLD (hyperlipidemia) 06/26/2015  . H/O: osteoarthritis 06/26/2015  . Arthritis, degenerative 06/26/2015  . Chronic kidney disease (CKD), stage III (moderate) 06/26/2015  . Osteopenia 06/26/2015  . Obstructive apnea 04/19/2015   Tilman Neat, SPT This entire session was performed under direct supervision and direction of a licensed therapist/therapist assistant . I have personally read, edited and approve of the note as written.  Trotter,Margaret PT, DPT 08/05/2016, 3:08 PM  [place] MAIN [facility] SERVICES [address] [place], [place], [address] Phone: [phone]   Fax:  [phone]  Name: JALEN GARG MRN: [id number] Date of Birth: 13-Nov-1940

## 2016-08-07 ENCOUNTER — Encounter: Payer: Self-pay | Admitting: Physical Therapy

## 2016-08-07 ENCOUNTER — Ambulatory Visit: Payer: Medicare Other | Attending: Internal Medicine | Admitting: Physical Therapy

## 2016-08-07 VITALS — BP 150/83 | HR 78

## 2016-08-07 DIAGNOSIS — R262 Difficulty in walking, not elsewhere classified: Secondary | ICD-10-CM | POA: Diagnosis present

## 2016-08-07 DIAGNOSIS — M6281 Muscle weakness (generalized): Secondary | ICD-10-CM | POA: Insufficient documentation

## 2016-08-07 DIAGNOSIS — R2681 Unsteadiness on feet: Secondary | ICD-10-CM | POA: Insufficient documentation

## 2016-08-07 NOTE — Therapy (Signed)
[place] MAIN [facility] SERVICES [address] [place], [place], [address] Phone: [phone]   Fax:  [phone]  Physical Therapy Treatment  Patient Details  Name: Jaclyn Galvan MRN: [id number] Date of Birth: 1940/12/19 Referring Provider: Dr. [name]  Encounter Date: 08/07/2016      PT End of Session - 08/07/16 1603    Visit Number 5   Number of Visits 17   Date for PT Re-Evaluation 09/18/16   Authorization Type GCodes 5/10   PT Start Time 1429   PT Stop Time 1514   PT Time Calculation (min) 45 min   Equipment Utilized During Treatment Gait belt   Activity Tolerance Patient tolerated treatment well   Behavior During Therapy [facility] for tasks assessed/performed;Flat affect      Past Medical History:  Diagnosis Date  . Anemia    resolved from the beginning of this year  . CKD (chronic kidney disease)   . 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 Elbow injury   . Fibromyalgia   . Hypercholesterolemia    controlled with meds  . Hypertension    controlled with meds, checks regularly  . Liver cyst   . Osteopenia   . PPD positive   . Skin cancer    recent, resolved  . Sleep apnea     Past Surgical History:  Procedure Laterality Date  . ADENOIDECTOMY    . APPENDECTOMY    . BACK SURGERY    . BILATERAL OOPHORECTOMY Right   . BREAST BIOPSY Right    1988 negative  . BREAST BIOPSY Right 09/25/2015   stereo unknown  . CHOLECYSTECTOMY    . DILATION AND CURETTAGE OF UTERUS  1966  . ESOPHAGOGASTRODUODENOSCOPY (EGD) WITH PROPOFOL N/A 07/31/2015   Procedure: ESOPHAGOGASTRODUODENOSCOPY (EGD) WITH PROPOFOL;  Surgeon: [name], MD;  Location: [facility] ENDOSCOPY;  Service: Endoscopy;  Laterality: N/A;  . ESOPHAGOGASTRODUODENOSCOPY (EGD) WITH PROPOFOL N/A 05/06/2016   Procedure: ESOPHAGOGASTRODUODENOSCOPY (EGD) WITH PROPOFOL;  Surgeon: [name], MD;  Location: [facility] ENDOSCOPY;  Service:  Endoscopy;  Laterality: N/A;  . TONSILLECTOMY    . TOTAL KNEE ARTHROPLASTY Bilateral    2001,2006  . TUBAL LIGATION  1971    Vitals:   08/07/16 1431  BP: (!) 150/83  Pulse: 78  SpO2: 96%        Subjective Assessment - 08/07/16 1429    Subjective Patient denies changes and falls. She states she has been doing well. She states she has mild soreness in hip muscles   Patient is accompained by: Family member   Pertinent History Factors Affecting Rehab: decreased mobility in her knee, multiple joints with limited ROM, chornic issues for ~2 years   Limitations Walking   How long can you sit comfortably? N/A   How long can you stand comfortably? 1-2 hours   How long can you walk comfortably? very limited, notes she can walk 20-30 minutes with AD until she is unsafe   Patient Stated Goals improve getting up/down from chairs; become more balanced   Currently in Pain? No/denies     Treatment:  Matrix hip ext, 2x10,  5# with RLE and 2.5# with LLE, min VCs to maintain upright posture, 2 HHA Matrix hip abd, 2x10, 2x5#, min VCs to rest prn, 2 HHA Tapping on BOSU ball, 2x10, 0-1 HHA, minA for stability, min VCs to decrease UE support and that weight shifts were normal for this exercise Sit to stands  with 2.5# bar push outs, 2x6, CGA for safety, minA x1 to decrease rate of post fall backwards onto mat table, min VCs to improve forward trunk flexion in order to decrease post lean Airex pad, feet together x10, tandem stance x10, UE rotations with ball reaches, minA for stability, min VCs to regain balance before continuing the activity Airex pad, feet together static stance, 2x30 seconds, CGA for stability, min VCs to increase weight shift to LLE since patient relies heavily on RLE.  Half bolster (flat side up) static stance, 2x8-10 seconds, minA for stability, 0-2 HHA depending on balance, min VCs to look at steady point or feet for balance, patient shows decreased ankle strategies and increased  hip strategy use during this activity                              PT Education - 08/07/16 1602    Education provided Yes   Education Details purpose of exercises, continuing with HEP   Person(s) Educated Patient   Methods Explanation   Comprehension Verbalized understanding             PT Long Term Goals - 07/24/16 1512      PT LONG TERM GOAL #1   Title Patient will be independent in HEP in order to increase patient's ability to maintain gains achieved in therapy and assist with return to PLOF.    Time 8   Period Weeks   Status New     PT LONG TERM GOAL #2   Title Patient will be able to perform 5x sit to stand with BUE in a safe manner in under 1 minute in order to perform transfers at supervision level.   Time 8   Period Weeks   Status New     PT LONG TERM GOAL #3   Title Patient will increase her 10 m walk test time to greater than or equal to 0.75 m/s in order to be a community ambulator at decreased risk for falls.    Time 8   Period Weeks   Status New     PT LONG TERM GOAL #4   Title Patient will increase Berg balance score to >45/56 in order to decrease patient's risk for falls during functional activities.    Time 8   Period Weeks   Status New     PT LONG TERM GOAL #5   Title Patient will be able to negotiate one 4" curb with min. UE support independently in a safe manner in order to allow for improved and safe community ambulation.    Time 8   Period Weeks   Status New               Plan - 08/07/16 1604    Clinical Impression Statement Although patient is slighlty sore, she is able to perform exercises but requires increased seated rest breaks throughout the session. She requires minA during balance activities and min VCs to improve exercise techinques. The patient continues to have difficulties with sit to stands, requiring min VCs to increase forward flexion at trunk to improve weight distribution. The patient would  continue to benefit from skilled PT in order to address BLE weakness, balance deficits, and improve safety with mobility.    Rehab Potential Fair   Clinical Impairments Affecting Rehab Potential Negative Factors: Chronicity of issue, frequent falls, multiple joints with limited motion; Positive Factors: supportive husband, motivated; Clinical Impression: Evolving-multiple falls  and mutliple co-morbidities affecting rehab   PT Frequency 2x / week   PT Duration 8 weeks   PT Treatment/Interventions ADLs/Self Care Home Management;Aquatic Therapy;Biofeedback;Canalith Repostioning;Cryotherapy;Electrical Stimulation;Moist Heat;DME Instruction;Gait training;Stair training;Functional mobility training;Therapeutic activities;Therapeutic exercise;Balance training;Neuromuscular re-education;Patient/family education;Manual techniques;Passive range of motion;Energy conservation   PT Next Visit Plan LE strengthening, balance activities, glut med strengthening with lateral step ups   PT Home Exercise Plan HEP maintained-see patient instructions   Consulted and Agree with Plan of Care Patient;Family member/caregiver      Patient will benefit from skilled therapeutic intervention in order to improve the following deficits and impairments:  Abnormal gait, Decreased activity tolerance, Decreased balance, Decreased coordination, Decreased endurance, Decreased mobility, Decreased range of motion, Decreased safety awareness, Decreased strength, Hypomobility, Increased edema, Impaired flexibility, Impaired sensation, Impaired UE functional use, Improper body mechanics, Postural dysfunction, Pain, Obesity  Visit Diagnosis: Unsteadiness on feet  Muscle weakness (generalized)  Difficulty in walking, not elsewhere classified     Problem List Patient Active Problem List   Diagnosis Date Noted  . Absolute anemia 06/26/2015  . A-fib ([place]) 06/26/2015  . Hepatic cyst 06/26/2015  . BP (high blood pressure) 06/26/2015   . HLD (hyperlipidemia) 06/26/2015  . H/O: osteoarthritis 06/26/2015  . Arthritis, degenerative 06/26/2015  . Chronic kidney disease (CKD), stage III (moderate) 06/26/2015  . Osteopenia 06/26/2015  . Obstructive apnea 04/19/2015   Tilman Neat, SPT This entire session was performed under direct supervision and direction of a licensed therapist/therapist assistant . I have personally read, edited and approve of the note as written.  Trotter,Margaret PT, DPT 08/07/2016, 5:00 PM  [place] MAIN [facility] SERVICES [address] [place], [place], [address] Phone: [phone]   Fax:  [phone]  Name: Jaclyn Galvan MRN: [id number] Date of Birth: [birth year]

## 2016-08-12 ENCOUNTER — Encounter: Payer: Self-pay | Admitting: Physical Therapy

## 2016-08-12 ENCOUNTER — Ambulatory Visit: Payer: Medicare Other | Admitting: Physical Therapy

## 2016-08-12 DIAGNOSIS — R262 Difficulty in walking, not elsewhere classified: Secondary | ICD-10-CM

## 2016-08-12 DIAGNOSIS — R2681 Unsteadiness on feet: Secondary | ICD-10-CM | POA: Diagnosis not present

## 2016-08-12 DIAGNOSIS — M6281 Muscle weakness (generalized): Secondary | ICD-10-CM

## 2016-08-12 NOTE — Therapy (Signed)
[place] MAIN [facility] SERVICES [address] [place], [place], [address] Phone: [phone]   Fax:  [phone]  Physical Therapy Treatment  Patient Details  Name: Jaclyn Galvan MRN: [id number] Date of Birth: 1941/09/02 Referring Provider: Dr. [name]  Encounter Date: 08/12/2016      PT End of Session - 08/12/16 1454    Visit Number 6   Number of Visits 17   Date for PT Re-Evaluation 09/18/16   Authorization Type GCodes 6/10   PT Start Time 1301   PT Stop Time 1344   PT Time Calculation (min) 43 min   Equipment Utilized During Treatment Gait belt   Activity Tolerance Patient tolerated treatment well   Behavior During Therapy [facility] for tasks assessed/performed;Flat affect      Past Medical History:  Diagnosis Date  . Anemia    resolved from the beginning of this year  . CKD (chronic kidney disease)   . 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 Elbow injury   . Fibromyalgia   . Hypercholesterolemia    controlled with meds  . Hypertension    controlled with meds, checks regularly  . Liver cyst   . Osteopenia   . PPD positive   . Skin cancer    recent, resolved  . Sleep apnea     Past Surgical History:  Procedure Laterality Date  . ADENOIDECTOMY    . APPENDECTOMY    . BACK SURGERY    . BILATERAL OOPHORECTOMY Right   . BREAST BIOPSY Right    1988 negative  . BREAST BIOPSY Right 09/25/2015   stereo unknown  . CHOLECYSTECTOMY    . DILATION AND CURETTAGE OF UTERUS  1966  . ESOPHAGOGASTRODUODENOSCOPY (EGD) WITH PROPOFOL N/A 07/31/2015   Procedure: ESOPHAGOGASTRODUODENOSCOPY (EGD) WITH PROPOFOL;  Surgeon: [name], MD;  Location: [facility] ENDOSCOPY;  Service: Endoscopy;  Laterality: N/A;  . ESOPHAGOGASTRODUODENOSCOPY (EGD) WITH PROPOFOL N/A 05/06/2016   Procedure: ESOPHAGOGASTRODUODENOSCOPY (EGD) WITH PROPOFOL;  Surgeon: [name], MD;  Location: [facility] ENDOSCOPY;  Service:  Endoscopy;  Laterality: N/A;  . TONSILLECTOMY    . TOTAL KNEE ARTHROPLASTY Bilateral    2001,2006  . TUBAL LIGATION  1971    There were no vitals filed for this visit.      Subjective Assessment - 08/12/16 1300    Subjective Patient denies falls or any changes from last session. She states her HEP is still challenging. She denies muscle soreness.    Patient is accompained by: Family member   Pertinent History Factors Affecting Rehab: decreased mobility in her knee, multiple joints with limited ROM, chornic issues for ~2 years   Limitations Walking   How long can you sit comfortably? N/A   How long can you stand comfortably? 1-2 hours   How long can you walk comfortably? very limited, notes she can walk 20-30 minutes with AD until she is unsafe   Patient Stated Goals improve getting up/down from chairs; become more balanced   Currently in Pain? No/denies      Treatment: TherEx: Sit to stands from lower mat table, 2x7, min VCs to scoot forward in order to decrease amount of LE on surface area, if patient not performing good forward flexion she was unable to stand Lateral walking over wooden plank, x2, red tband resistance, minA for stability, min VCs to increase step length, 0 HHA Standing hip extension, 7.5# on RLE and 5# on LLE, min VCs for  upright posture Forward step ups on 4" step, x10 each LE, rest break at 5 reps with RLE, min VCs to control eccentric movement     Instructed patient about performing a curb, "up with the good, down with the bad" to increase her stability during this activity.  Toe/heel raises, x15, min VCs to increase range of motion, 1 HHA,  Instructed to perform at home Patient requires rest break after sit to stands and hip extension exercises due to muscular fatigue  Neuro Re-ed: Static stand with wide tandem stance, x30 seconds each LE leading, min VCs to increase difficulty with vertical head movements Leading foot on dynadisc, x30 seconds each LE, minA  for stability, 0-1 HHA, min VCs to decrease UE support and regain balance if losing     Increased with balloon hits with RUE only, minA for stability, 0-1 HHA depending on balance, min VCs to regain balance before continuing with the activity                              PT Education - 08/12/16 1454    Education provided Yes   Education Details continuing with HEP, re-starting with ankle PF/DF for ankle strengthening   Person(s) Educated Patient   Methods Explanation   Comprehension Verbalized understanding             PT Long Term Goals - 07/24/16 1512      PT LONG TERM GOAL #1   Title Patient will be independent in HEP in order to increase patient's ability to maintain gains achieved in therapy and assist with return to PLOF.    Time 8   Period Weeks   Status New     PT LONG TERM GOAL #2   Title Patient will be able to perform 5x sit to stand with BUE in a safe manner in under 1 minute in order to perform transfers at supervision level.   Time 8   Period Weeks   Status New     PT LONG TERM GOAL #3   Title Patient will increase her 10 m walk test time to greater than or equal to 0.75 m/s in order to be a community ambulator at decreased risk for falls.    Time 8   Period Weeks   Status New     PT LONG TERM GOAL #4   Title Patient will increase Berg balance score to >45/56 in order to decrease patient's risk for falls during functional activities.    Time 8   Period Weeks   Status New     PT LONG TERM GOAL #5   Title Patient will be able to negotiate one 4" curb with min. UE support independently in a safe manner in order to allow for improved and safe community ambulation.    Time 8   Period Weeks   Status New               Plan - 08/12/16 1455    Clinical Impression Statement The patient's BP is mildly high at 160/88, but she denies symptoms related to cardiac or blood pressure issues. The patient continues to require minA during  balance activities for safety and min VCs to improve techniques as well as decrease UE support in order to challenge herself. The patient notes activities are getting easier at home, but she continues to demonstrate a weaker LLE compared to RLE during activities and is off balance especially  during activities like stepping. The patient would continue to benefit from skilled PT in order to address BLE weakness, balance deficits, and improe safety with mobility.    Rehab Potential Fair   Clinical Impairments Affecting Rehab Potential Negative Factors: Chronicity of issue, frequent falls, multiple joints with limited motion; Positive Factors: supportive husband, motivated; Clinical Impression: Evolving-multiple falls and mutliple co-morbidities affecting rehab   PT Frequency 2x / week   PT Duration 8 weeks   PT Treatment/Interventions ADLs/Self Care Home Management;Aquatic Therapy;Biofeedback;Canalith Repostioning;Cryotherapy;Electrical Stimulation;Moist Heat;DME Instruction;Gait training;Stair training;Functional mobility training;Therapeutic activities;Therapeutic exercise;Balance training;Neuromuscular re-education;Patient/family education;Manual techniques;Passive range of motion;Energy conservation   PT Next Visit Plan LE strengthening, balance activities, glut med strengthening with lateral step ups   PT Home Exercise Plan HEP maintained-see patient instructions   Consulted and Agree with Plan of Care Patient;Family member/caregiver      Patient will benefit from skilled therapeutic intervention in order to improve the following deficits and impairments:  Abnormal gait, Decreased activity tolerance, Decreased balance, Decreased coordination, Decreased endurance, Decreased mobility, Decreased range of motion, Decreased safety awareness, Decreased strength, Hypomobility, Increased edema, Impaired flexibility, Impaired sensation, Impaired UE functional use, Improper body mechanics, Postural dysfunction,  Pain, Obesity  Visit Diagnosis: Unsteadiness on feet  Muscle weakness (generalized)  Difficulty in walking, not elsewhere classified     Problem List Patient Active Problem List   Diagnosis Date Noted  . Absolute anemia 06/26/2015  . A-fib ([place]) 06/26/2015  . Hepatic cyst 06/26/2015  . BP (high blood pressure) 06/26/2015  . HLD (hyperlipidemia) 06/26/2015  . H/O: osteoarthritis 06/26/2015  . Arthritis, degenerative 06/26/2015  . Chronic kidney disease (CKD), stage III (moderate) 06/26/2015  . Osteopenia 06/26/2015  . Obstructive apnea 04/19/2015   Tilman Neat, SPT This entire session was performed under direct supervision and direction of a licensed therapist/therapist assistant . I have personally read, edited and approve of the note as written.  Trotter,Margaret PT, DPT 08/13/2016, 9:01 AM  [place] MAIN [facility] SERVICES [address] [place], [place], [address] Phone: [phone]   Fax:  [phone]  Name: LORE LOPEZ MRN: [id number] Date of Birth: 10/05/1941

## 2016-08-14 ENCOUNTER — Encounter: Payer: Self-pay | Admitting: Physical Therapy

## 2016-08-14 ENCOUNTER — Ambulatory Visit: Payer: Medicare Other | Admitting: Physical Therapy

## 2016-08-14 DIAGNOSIS — R2681 Unsteadiness on feet: Secondary | ICD-10-CM

## 2016-08-14 DIAGNOSIS — R262 Difficulty in walking, not elsewhere classified: Secondary | ICD-10-CM

## 2016-08-14 DIAGNOSIS — M6281 Muscle weakness (generalized): Secondary | ICD-10-CM

## 2016-08-14 NOTE — Therapy (Signed)
[place] MAIN [facility] SERVICES [address] [place], [place], [address] Phone: [phone]   Fax:  [phone]  Physical Therapy Treatment  Patient Details  Name: Jaclyn Galvan MRN: [id number] Date of Birth: [birth year] Referring Provider: Dr. [name]  Encounter Date: 08/14/2016      PT End of Session - 08/14/16 1359    Visit Number 7   Number of Visits 17   Date for PT Re-Evaluation 09/18/16   Authorization Type GCodes 7/10   PT Start Time 1300   PT Stop Time 1355   PT Time Calculation (min) 55 min   Equipment Utilized During Treatment Gait belt   Activity Tolerance Patient tolerated treatment well   Behavior During Therapy [facility] for tasks assessed/performed;Flat affect      Past Medical History:  Diagnosis Date  . Anemia    resolved from the beginning of this year  . CKD (chronic kidney disease)   . 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 Elbow injury   . Fibromyalgia   . Hypercholesterolemia    controlled with meds  . Hypertension    controlled with meds, checks regularly  . Liver cyst   . Osteopenia   . PPD positive   . Skin cancer    recent, resolved  . Sleep apnea     Past Surgical History:  Procedure Laterality Date  . ADENOIDECTOMY    . APPENDECTOMY    . BACK SURGERY    . BILATERAL OOPHORECTOMY Right   . BREAST BIOPSY Right    1988 negative  . BREAST BIOPSY Right 09/25/2015   stereo unknown  . CHOLECYSTECTOMY    . DILATION AND CURETTAGE OF UTERUS  1966  . ESOPHAGOGASTRODUODENOSCOPY (EGD) WITH PROPOFOL N/A 07/31/2015   Procedure: ESOPHAGOGASTRODUODENOSCOPY (EGD) WITH PROPOFOL;  Surgeon: [name], MD;  Location: [facility] ENDOSCOPY;  Service: Endoscopy;  Laterality: N/A;  . ESOPHAGOGASTRODUODENOSCOPY (EGD) WITH PROPOFOL N/A 05/06/2016   Procedure: ESOPHAGOGASTRODUODENOSCOPY (EGD) WITH PROPOFOL;  Surgeon: [name], MD;  Location: [facility] ENDOSCOPY;  Service:  Endoscopy;  Laterality: N/A;  . TONSILLECTOMY    . TOTAL KNEE ARTHROPLASTY Bilateral    2001,2006  . TUBAL LIGATION  1971    There were no vitals filed for this visit.      Subjective Assessment - 08/14/16 1300    Subjective Patient denies falls or any changes from last session. States she is doing well and denies symptoms.    Patient is accompained by: Family member   Pertinent History Factors Affecting Rehab: decreased mobility in her knee, multiple joints with limited ROM, chornic issues for ~2 years   Limitations Walking   How long can you sit comfortably? N/A   How long can you stand comfortably? 1-2 hours   How long can you walk comfortably? very limited, notes she can walk 20-30 minutes with AD until she is unsafe   Patient Stated Goals improve getting up/down from chairs; become more balanced   Currently in Pain? No/denies     Treatment: BP assessed manually and is mildly high; patient requires 2-3 rest breaks mostly due to muscular fatigue  Wall squats with pball, 3x10, min VCs for foot placement and to decrease knees moving over toes Lateral lunges on BOSU, 2x10 each LE, CGA for safety, 0-1 HHA, min VCs to maintain knee over toes to decrease knee stress Sit to stands, 2x8, x5, CGA-minA for safety with patient losing balance post, 0 HHA,  min VCs to increase forward trunk flexion Forward and lateral stepping over objects of 2 half bolsters and orange bar, x4 each direction, min VCs to move closer to objects before performing step over Heel/toe raises, 2x10, CGA for safety, 0-1 HHA, min VCs to increase ankle DF  Static stance on airex pad, eyes open and eyes closed 2x30 seconds each, min VCs to stand upright Tilt board ant/post tilts, x10 each and static balance in both directions with UE reaches in multiplanar directions to 3 cards with RUE only, 0 HHA, CGA-minA for stability Forward stepping to dot, 2x10, 0 HHA, minA, with min VCs to alternate LEs Reactive stepping 4  directions, x15, 0 HHA, minA for stability, min VCs to step with different feet in order to challenge balance on both sides  Static stand tandem stance on airex pad, 3x30 seconds, instructed to increase difficulty with horizontal head turns, 0 HHA, minA for stability                           PT Education - 08/14/16 1358    Education provided Yes   Education Details focusing on DF during standing, exercise technique   Person(s) Educated Patient   Methods Explanation   Comprehension Verbalized understanding             PT Long Term Goals - 07/24/16 1512      PT LONG TERM GOAL #1   Title Patient will be independent in HEP in order to increase patient's ability to maintain gains achieved in therapy and assist with return to PLOF.    Time 8   Period Weeks   Status New     PT LONG TERM GOAL #2   Title Patient will be able to perform 5x sit to stand with BUE in a safe manner in under 1 minute in order to perform transfers at supervision level.   Time 8   Period Weeks   Status New     PT LONG TERM GOAL #3   Title Patient will increase her 10 m walk test time to greater than or equal to 0.75 m/s in order to be a community ambulator at decreased risk for falls.    Time 8   Period Weeks   Status New     PT LONG TERM GOAL #4   Title Patient will increase Berg balance score to >45/56 in order to decrease patient's risk for falls during functional activities.    Time 8   Period Weeks   Status New     PT LONG TERM GOAL #5   Title Patient will be able to negotiate one 4" curb with min. UE support independently in a safe manner in order to allow for improved and safe community ambulation.    Time 8   Period Weeks   Status New               Plan - 08/14/16 1359    Clinical Impression Statement The patien'ts BP continues to be high at 170/90 but she continues to deny symptoms throughout exercise. The patient requires minA to maintain balance during  activities and min-mod VCs to perform exercises with appropriate technique. The patient is motivated to keep improving. She states that she continues to have difficulty with sit to stands even with cueing. The patinet would continue to benefit from skilled PT in order to address BLE weakness, balance deficits, and improve safety with mobility.  Rehab Potential Fair   Clinical Impairments Affecting Rehab Potential Negative Factors: Chronicity of issue, frequent falls, multiple joints with limited motion; Positive Factors: supportive husband, motivated; Clinical Impression: Evolving-multiple falls and mutliple co-morbidities affecting rehab   PT Frequency 2x / week   PT Duration 8 weeks   PT Treatment/Interventions ADLs/Self Care Home Management;Aquatic Therapy;Biofeedback;Canalith Repostioning;Cryotherapy;Electrical Stimulation;Moist Heat;DME Instruction;Gait training;Stair training;Functional mobility training;Therapeutic activities;Therapeutic exercise;Balance training;Neuromuscular re-education;Patient/family education;Manual techniques;Passive range of motion;Energy conservation   PT Next Visit Plan LE strengthening, balance activities, glut med strengthening with lateral step ups   PT Home Exercise Plan HEP maintained-see patient instructions   Consulted and Agree with Plan of Care Patient;Family member/caregiver      Patient will benefit from skilled therapeutic intervention in order to improve the following deficits and impairments:  Abnormal gait, Decreased activity tolerance, Decreased balance, Decreased coordination, Decreased endurance, Decreased mobility, Decreased range of motion, Decreased safety awareness, Decreased strength, Hypomobility, Increased edema, Impaired flexibility, Impaired sensation, Impaired UE functional use, Improper body mechanics, Postural dysfunction, Pain, Obesity  Visit Diagnosis: Unsteadiness on feet  Muscle weakness (generalized)  Difficulty in walking, not  elsewhere classified     Problem List Patient Active Problem List   Diagnosis Date Noted  . Absolute anemia 06/26/2015  . A-fib ([place]) 06/26/2015  . Hepatic cyst 06/26/2015  . BP (high blood pressure) 06/26/2015  . HLD (hyperlipidemia) 06/26/2015  . H/O: osteoarthritis 06/26/2015  . Arthritis, degenerative 06/26/2015  . Chronic kidney disease (CKD), stage III (moderate) 06/26/2015  . Osteopenia 06/26/2015  . Obstructive apnea 04/19/2015   Tilman Neat, SPT This entire session was performed under direct supervision and direction of a licensed therapist/therapist assistant . I have personally read, edited and approve of the note as written.  Trotter,Margaret PT, DPT 08/14/2016, 3:03 PM  [place] MAIN [facility] SERVICES [address] [place], [place], [address] Phone: [phone]   Fax:  [phone]  Name: Jaclyn Galvan MRN: [id number] Date of Birth: 1941/05/20

## 2016-08-19 ENCOUNTER — Ambulatory Visit: Payer: Medicare Other | Admitting: Physical Therapy

## 2016-08-19 ENCOUNTER — Encounter: Payer: Self-pay | Admitting: Physical Therapy

## 2016-08-19 VITALS — BP 160/80

## 2016-08-19 DIAGNOSIS — M6281 Muscle weakness (generalized): Secondary | ICD-10-CM

## 2016-08-19 DIAGNOSIS — R2681 Unsteadiness on feet: Secondary | ICD-10-CM

## 2016-08-19 DIAGNOSIS — R262 Difficulty in walking, not elsewhere classified: Secondary | ICD-10-CM

## 2016-08-19 NOTE — Therapy (Signed)
[place] MAIN [facility] SERVICES [address] [place], [place], [address] Phone: [phone]   Fax:  [phone]  Physical Therapy Treatment  Patient Details  Name: Jaclyn Galvan MRN: [id number] Date of Birth: 03-10-1941 Referring Provider: Dr. [name]  Encounter Date: 08/19/2016      PT End of Session - 08/19/16 1523    Visit Number 8   Number of Visits 17   Date for PT Re-Evaluation 09/18/16   Authorization Type GCodes 8/10   PT Start Time 1330   PT Stop Time 1415   PT Time Calculation (min) 45 min   Equipment Utilized During Treatment Gait belt   Activity Tolerance Patient tolerated treatment well   Behavior During Therapy [facility] for tasks assessed/performed;Flat affect      Past Medical History:  Diagnosis Date  . Anemia    resolved from the beginning of this year  . CKD (chronic kidney disease)   . 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 Elbow injury   . Fibromyalgia   . Hypercholesterolemia    controlled with meds  . Hypertension    controlled with meds, checks regularly  . Liver cyst   . Osteopenia   . PPD positive   . Skin cancer    recent, resolved  . Sleep apnea     Past Surgical History:  Procedure Laterality Date  . ADENOIDECTOMY    . APPENDECTOMY    . BACK SURGERY    . BILATERAL OOPHORECTOMY Right   . BREAST BIOPSY Right    1988 negative  . BREAST BIOPSY Right 09/25/2015   stereo unknown  . CHOLECYSTECTOMY    . DILATION AND CURETTAGE OF UTERUS  1966  . ESOPHAGOGASTRODUODENOSCOPY (EGD) WITH PROPOFOL N/A 07/31/2015   Procedure: ESOPHAGOGASTRODUODENOSCOPY (EGD) WITH PROPOFOL;  Surgeon: [name], MD;  Location[facility] ENDOSCOPY;  Service: Endoscopy;  Laterality: N/A;  . ESOPHAGOGASTRODUODENOSCOPY (EGD) WITH PROPOFOL N/A 05/06/2016   Procedure: ESOPHAGOGASTRODUODENOSCOPY (EGD) WITH PROPOFOL;  Surgeon: [name], MD;  Location: [facility] ENDOSCOPY;  Service:  Endoscopy;  Laterality: N/A;  . TONSILLECTOMY    . TOTAL KNEE ARTHROPLASTY Bilateral    2001,2006  . TUBAL LIGATION  1971    Vitals:   08/19/16 1334  BP: (!) 160/80        Subjective Assessment - 08/19/16 1338    Subjective Pt reports she is doing well today with no changes.  Pt reports fall after her husband was trying to help her up out of a chair and their feet got tangled.  No injuires reported.          Treatment Nustep x 4 mins BUEs/BLEs level 2 (unbilled)  Supine bridges, 2 sets x 10 reps, min VCs to increase glut activation, modified due to patients inability to bend R knee after knee replacement Sit to stands from low mat table with three second eccentric lowering x 10 reps, airex pad under pt to facilitate couch surface, min VCS for increased glut activation  Resisted hip flexion marches, 2 sets x 20 reps alternating LEs, min VCs to increase hip flexion ROM and upright posture  Oblique twists standing on airex pad, min A for stability, min VCs for greater glut activation  Tandem stance with side to side head turns each foot in front x 10 reps, CGA for stability, min VCs for upright posture  Tandem stance with anterior posterior head turns each foot in front x 10 reps,  CGA for stability, min VCs for upright posture  4 square stepping without AD, forwards/backwards/sideways x 20 reps, min A for stability, min VCs for larger step length  Monster walks over balance beam x 1 lap forwards, min VCs to increase step length to provide room for second foot, several LOB corrected with min A from therapist  Bosu step taps, 2 sets x 10 reps alternating LE, 2 finger assist for stability, CGA from therapist, min VCs to increase hip and knee flexion for greater foot clearance                        PT Education - 08/19/16 1522    Education provided Yes   Education Details decreased right foot shuffling    Person(s) Educated Patient   Methods  Explanation;Demonstration;Verbal cues   Comprehension Verbalized understanding;Returned demonstration;Verbal cues required             PT Long Term Goals - 07/24/16 1512      PT LONG TERM GOAL #1   Title Patient will be independent in HEP in order to increase patient's ability to maintain gains achieved in therapy and assist with return to PLOF.    Time 8   Period Weeks   Status New     PT LONG TERM GOAL #2   Title Patient will be able to perform 5x sit to stand with BUE in a safe manner in under 1 minute in order to perform transfers at supervision level.   Time 8   Period Weeks   Status New     PT LONG TERM GOAL #3   Title Patient will increase her 10 m walk test time to greater than or equal to 0.75 m/s in order to be a community ambulator at decreased risk for falls.    Time 8   Period Weeks   Status New     PT LONG TERM GOAL #4   Title Patient will increase Berg balance score to >45/56 in order to decrease patient's risk for falls during functional activities.    Time 8   Period Weeks   Status New     PT LONG TERM GOAL #5   Title Patient will be able to negotiate one 4" curb with min. UE support independently in a safe manner in order to allow for improved and safe community ambulation.    Time 8   Period Weeks   Status New               Plan - 08/19/16 1523    Clinical Impression Statement Pt reported fall this past week while her husband was trying to help her get up from the chair and their feet got tangled. She demonstrates difficulty taking monster steps over balance beam and loses balance requiring min A from therapist for stability.  Continued to work on sit to stand training with min VCs forward lean and increased glut activation.  Pt fatiuges easily and requires frequent standing rest breaks.  Pt continues to be challenged with static balance activities requiring head turns.  She would benefit from skilled PT to work on lower extremity strength and  dynamic balance for more functional mobility.     Rehab Potential Fair   Clinical Impairments Affecting Rehab Potential Negative Factors: Chronicity of issue, frequent falls, multiple joints with limited motion; Positive Factors: supportive husband, motivated; Clinical Impression: Evolving-multiple falls and mutliple co-morbidities affecting rehab   PT Frequency 2x / week  PT Duration 8 weeks   PT Treatment/Interventions ADLs/Self Care Home Management;Aquatic Therapy;Biofeedback;Canalith Repostioning;Cryotherapy;Electrical Stimulation;Moist Heat;DME Instruction;Gait training;Stair training;Functional mobility training;Therapeutic activities;Therapeutic exercise;Balance training;Neuromuscular re-education;Patient/family education;Manual techniques;Passive range of motion;Energy conservation   PT Next Visit Plan LE strengthening, balance activities, glut med strengthening with lateral step ups   PT Home Exercise Plan HEP maintained-see patient instructions   Consulted and Agree with Plan of Care Patient;Family member/caregiver      Patient will benefit from skilled therapeutic intervention in order to improve the following deficits and impairments:  Abnormal gait, Decreased activity tolerance, Decreased balance, Decreased coordination, Decreased endurance, Decreased mobility, Decreased range of motion, Decreased safety awareness, Decreased strength, Hypomobility, Increased edema, Impaired flexibility, Impaired sensation, Impaired UE functional use, Improper body mechanics, Postural dysfunction, Pain, Obesity  Visit Diagnosis: Unsteadiness on feet  Muscle weakness (generalized)  Difficulty in walking, not elsewhere classified     Problem List Patient Active Problem List   Diagnosis Date Noted  . Absolute anemia 06/26/2015  . A-fib ([place]) 06/26/2015  . Hepatic cyst 06/26/2015  . BP (high blood pressure) 06/26/2015  . HLD (hyperlipidemia) 06/26/2015  . H/O: osteoarthritis 06/26/2015  .  Arthritis, degenerative 06/26/2015  . Chronic kidney disease (CKD), stage III (moderate) 06/26/2015  . Osteopenia 06/26/2015  . Obstructive apnea 04/19/2015   Stacy Gardner, SPT  This entire session was performed under direct supervision and direction of a licensed therapist/therapist assistant . I have personally read, edited and approve of the note as written.  Trotter,Margaret PT, DPT 08/19/2016, 6:02 PM  [place] MAIN [facility] SERVICES [address] [place], [place], [address] Phone: [phone]   Fax:  [phone]  Name: Jaclyn Galvan MRN: [id number] Date of Birth: January 29, 1941

## 2016-08-21 ENCOUNTER — Ambulatory Visit: Payer: Medicare Other | Admitting: Physical Therapy

## 2016-08-21 ENCOUNTER — Encounter: Payer: Self-pay | Admitting: Physical Therapy

## 2016-08-21 VITALS — BP 130/90

## 2016-08-21 DIAGNOSIS — R2681 Unsteadiness on feet: Secondary | ICD-10-CM | POA: Diagnosis not present

## 2016-08-21 DIAGNOSIS — R262 Difficulty in walking, not elsewhere classified: Secondary | ICD-10-CM

## 2016-08-21 DIAGNOSIS — M6281 Muscle weakness (generalized): Secondary | ICD-10-CM

## 2016-08-21 NOTE — Therapy (Signed)
[place] MAIN [facility] SERVICES [address] [place], [place], [address] Phone: [phone]   Fax:  [phone]  Physical Therapy Treatment  Patient Details  Name: Jaclyn Galvan MRN: [id number] Date of Birth: 08-Nov-1940 Referring Provider: Dr. [name]  Encounter Date: 08/21/2016      PT End of Session - 08/21/16 1319    Visit Number 9   Number of Visits 17   Date for PT Re-Evaluation 09/18/16   Authorization Type GCodes 9/10   PT Start Time 1309   PT Stop Time 1345   PT Time Calculation (min) 36 min   Equipment Utilized During Treatment Gait belt   Activity Tolerance Patient tolerated treatment well   Behavior During Therapy [facility] for tasks assessed/performed;Flat affect      Past Medical History:  Diagnosis Date  . Anemia    resolved from the beginning of this year  . CKD (chronic kidney disease)   . 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 Elbow injury   . Fibromyalgia   . Hypercholesterolemia    controlled with meds  . Hypertension    controlled with meds, checks regularly  . Liver cyst   . Osteopenia   . PPD positive   . Skin cancer    recent, resolved  . Sleep apnea     Past Surgical History:  Procedure Laterality Date  . ADENOIDECTOMY    . APPENDECTOMY    . BACK SURGERY    . BILATERAL OOPHORECTOMY Right   . BREAST BIOPSY Right    1988 negative  . BREAST BIOPSY Right 09/25/2015   stereo unknown  . CHOLECYSTECTOMY    . DILATION AND CURETTAGE OF UTERUS  1966  . ESOPHAGOGASTRODUODENOSCOPY (EGD) WITH PROPOFOL N/A 07/31/2015   Procedure: ESOPHAGOGASTRODUODENOSCOPY (EGD) WITH PROPOFOL;  Surgeon: [name], MD;  Location: [facility] ENDOSCOPY;  Service: Endoscopy;  Laterality: N/A;  . ESOPHAGOGASTRODUODENOSCOPY (EGD) WITH PROPOFOL N/A 05/06/2016   Procedure: ESOPHAGOGASTRODUODENOSCOPY (EGD) WITH PROPOFOL;  Surgeon: [name], MD;  Location: [facility] ENDOSCOPY;  Service:  Endoscopy;  Laterality: N/A;  . TONSILLECTOMY    . TOTAL KNEE ARTHROPLASTY Bilateral    2001,2006  . TUBAL LIGATION  1971    Vitals:   08/21/16 1315  BP: 130/90        Subjective Assessment - 08/21/16 1316    Subjective Pt arrived 9 minutes late to her appointment, thus limiting session.  Pt reports she has been doing her exercises every other day.  Denies any falls since her last visit.  Pt reports she finds herself walking too quickly when going down inclines.   Currently in Pain? Yes   Pain Score 5    Pain Location Generalized   Pain Descriptors / Indicators Aching   Pain Type Chronic pain      TREATMENT  Therapeutic Exercise:  Sit<>stand x10, pt fatigues after 6 reps. Cues for upright posture but to control motion as she comes to standing as she had the tendency to demonstrate posterior bias, requiring minA at times to avoid LOB.  Supine bridges, 2 sets x 10 reps, min VCs to increase glut and core activation, modified due to patients inability to bend R knee after knee replacement.  Seated Bil hip ER/Abd with RTB around knees, 2x15  Resisted hip flexion marches, 2 sets x 20 reps alternating LEs, min VCs to increase hip flexion ROM and upright posture  Agility ladder forward x4 lengths with two  feet per square and cues for increased hip F as pt demonstrates poor foot clearance  Alternating toe taps up to 8" step, 2x20 1HHA for first set, no HHA for second set with occasional minA to steady and LOB x2, pt reaches out for // bars to steady.  Tandem stance with horizontal and vertical head turns each foot in front x 10 reps, up to mod assist with vertical head turn, min VCs for upright posture                PT Education - 08/21/16 1319    Education provided Yes   Education Details Exercise Technique; Correct posture   Person(s) Educated Patient   Methods Demonstration;Explanation;Tactile cues;Verbal cues   Comprehension Verbalized understanding;Returned  demonstration;Need further instruction             PT Long Term Goals - 07/24/16 1512      PT LONG TERM GOAL #1   Title Patient will be independent in HEP in order to increase patient's ability to maintain gains achieved in therapy and assist with return to PLOF.    Time 8   Period Weeks   Status New     PT LONG TERM GOAL #2   Title Patient will be able to perform 5x sit to stand with BUE in a safe manner in under 1 minute in order to perform transfers at supervision level.   Time 8   Period Weeks   Status New     PT LONG TERM GOAL #3   Title Patient will increase her 10 m walk test time to greater than or equal to 0.75 m/s in order to be a community ambulator at decreased risk for falls.    Time 8   Period Weeks   Status New     PT LONG TERM GOAL #4   Title Patient will increase Berg balance score to >45/56 in order to decrease patient's risk for falls during functional activities.    Time 8   Period Weeks   Status New     PT LONG TERM GOAL #5   Title Patient will be able to negotiate one 4" curb with min. UE support independently in a safe manner in order to allow for improved and safe community ambulation.    Time 8   Period Weeks   Status New               Plan - 08/21/16 1349    Clinical Impression Statement Pt denies any falls since last session but demonstrates posterior bias and LOB with vertical head movements and sit<>stand, requiring assist at times to steady.  She requires mod verbal cues throughout exercises for proper technique.  She tolerated all interventions well.  She will benefit from continued skilled PT services to improve strength and balance to decrease risk of falling.   Rehab Potential Fair   Clinical Impairments Affecting Rehab Potential Negative Factors: Chronicity of issue, frequent falls, multiple joints with limited motion; Positive Factors: supportive husband, motivated; Clinical Impression: Evolving-multiple falls and mutliple  co-morbidities affecting rehab   PT Frequency 2x / week   PT Duration 8 weeks   PT Treatment/Interventions ADLs/Self Care Home Management;Aquatic Therapy;Biofeedback;Canalith Repostioning;Cryotherapy;Electrical Stimulation;Moist Heat;DME Instruction;Gait training;Stair training;Functional mobility training;Therapeutic activities;Therapeutic exercise;Balance training;Neuromuscular re-education;Patient/family education;Manual techniques;Passive range of motion;Energy conservation   PT Next Visit Plan LE strengthening, balance activities, glut med strengthening with lateral step ups   PT Home Exercise Plan HEP maintained-see patient instructions   Consulted and Agree  with Plan of Care Patient;Family member/caregiver      Patient will benefit from skilled therapeutic intervention in order to improve the following deficits and impairments:  Abnormal gait, Decreased activity tolerance, Decreased balance, Decreased coordination, Decreased endurance, Decreased mobility, Decreased range of motion, Decreased safety awareness, Decreased strength, Hypomobility, Increased edema, Impaired flexibility, Impaired sensation, Impaired UE functional use, Improper body mechanics, Postural dysfunction, Pain, Obesity  Visit Diagnosis: Unsteadiness on feet  Muscle weakness (generalized)  Difficulty in walking, not elsewhere classified     Problem List Patient Active Problem List   Diagnosis Date Noted  . Absolute anemia 06/26/2015  . A-fib ([place]) 06/26/2015  . Hepatic cyst 06/26/2015  . BP (high blood pressure) 06/26/2015  . HLD (hyperlipidemia) 06/26/2015  . H/O: osteoarthritis 06/26/2015  . Arthritis, degenerative 06/26/2015  . Chronic kidney disease (CKD), stage III (moderate) 06/26/2015  . Osteopenia 06/26/2015  . Obstructive apnea 04/19/2015     Collie Siad PT, DPT 08/21/2016, 1:51 PM  [place] MAIN [facility] SERVICES [address] [place], [place],  [address] Phone: [phone]   Fax:  [phone]  Name: Jaclyn Galvan MRN: [id number] Date of Birth: 1940/11/03

## 2016-08-26 ENCOUNTER — Other Ambulatory Visit: Payer: Self-pay | Admitting: Internal Medicine

## 2016-08-26 ENCOUNTER — Encounter: Payer: Self-pay | Admitting: Physical Therapy

## 2016-08-26 ENCOUNTER — Ambulatory Visit: Payer: Medicare Other | Admitting: Physical Therapy

## 2016-08-26 VITALS — BP 160/90

## 2016-08-26 DIAGNOSIS — R2681 Unsteadiness on feet: Secondary | ICD-10-CM

## 2016-08-26 DIAGNOSIS — Z1231 Encounter for screening mammogram for malignant neoplasm of breast: Secondary | ICD-10-CM

## 2016-08-26 DIAGNOSIS — M6281 Muscle weakness (generalized): Secondary | ICD-10-CM

## 2016-08-26 DIAGNOSIS — R262 Difficulty in walking, not elsewhere classified: Secondary | ICD-10-CM

## 2016-08-26 NOTE — Therapy (Addendum)
[place] MAIN [facility] SERVICES [address] [place], [place], [address] Phone: [phone]   Fax:  [phone]  Physical Therapy Treatment/Progress Note  Patient Details  Name: Jaclyn Galvan MRN: [id number] Date of Birth: [birth year] Referring Provider: Dr. [name]  Encounter Date: 08/26/2016      PT End of Session - 08/26/16 1317    Visit Number 10   Number of Visits 17   Date for PT Re-Evaluation 09/18/16   Authorization Type GCodes 10/10   PT Start Time 1300   PT Stop Time 1345   PT Time Calculation (min) 45 min   Equipment Utilized During Treatment Gait belt   Activity Tolerance Patient tolerated treatment well   Behavior During Therapy [facility] for tasks assessed/performed;Flat affect      Past Medical History:  Diagnosis Date  . Anemia    resolved from the beginning of this year  . CKD (chronic kidney disease)   . 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 Elbow injury   . Fibromyalgia   . Hypercholesterolemia    controlled with meds  . Hypertension    controlled with meds, checks regularly  . Liver cyst   . Osteopenia   . PPD positive   . Skin cancer    recent, resolved  . Sleep apnea     Past Surgical History:  Procedure Laterality Date  . ADENOIDECTOMY    . APPENDECTOMY    . BACK SURGERY    . BILATERAL OOPHORECTOMY Right   . BREAST BIOPSY Right    1988 negative  . BREAST BIOPSY Right 09/25/2015   stereo unknown  . CHOLECYSTECTOMY    . DILATION AND CURETTAGE OF UTERUS  1966  . ESOPHAGOGASTRODUODENOSCOPY (EGD) WITH PROPOFOL N/A 07/31/2015   Procedure: ESOPHAGOGASTRODUODENOSCOPY (EGD) WITH PROPOFOL;  Surgeon: [name], MD;  Location: [facility] ENDOSCOPY;  Service: Endoscopy;  Laterality: N/A;  . ESOPHAGOGASTRODUODENOSCOPY (EGD) WITH PROPOFOL N/A 05/06/2016   Procedure: ESOPHAGOGASTRODUODENOSCOPY (EGD) WITH PROPOFOL;  Surgeon: [name], MD;  Location: [facility] ENDOSCOPY;   Service: Endoscopy;  Laterality: N/A;  . TONSILLECTOMY    . TOTAL KNEE ARTHROPLASTY Bilateral    2001,2006  . TUBAL LIGATION  1971    Vitals:   08/26/16 1316  BP: (!) 160/90        Subjective Assessment - 08/26/16 1308    Subjective patient reports doing well; no new changes; she reports compliance with HEP; Patient denies any pain today; She presents with rollator;    Patient is accompained by: Family member   Pertinent History Factors Affecting Rehab: decreased mobility in her knee, multiple joints with limited ROM, chornic issues for ~2 years   Limitations Walking   How long can you sit comfortably? N/A   How long can you stand comfortably? 1-2 hours   How long can you walk comfortably? very limited, notes she can walk 20-30 minutes with AD until she is unsafe   Patient Stated Goals improve getting up/down from chairs; become more balanced   Currently in Pain? No/denies            [facility] PT Assessment - 08/26/16 0001      Standardized Balance Assessment   Five times sit to stand comments  35 sec with 1-2 HHA pushing on chair; (>15 sec indicates increased risk for falls) improved from 07/24/16 which was 42.6 sec for only 2 sit<>Stand   10 Meter Walk 0.8 m/s close supervision with  rollator; home ambulator; improved from 07/24/16 which was 0.5 m/s with rollator     Berg Balance Test   Sit to Stand Able to stand  independently using hands   Standing Unsupported Able to stand safely 2 minutes   Sitting with Back Unsupported but Feet Supported on Floor or Stool Able to sit safely and securely 2 minutes   Stand to Sit Controls descent by using hands   Transfers Able to transfer safely, definite need of hands   Standing Unsupported with Eyes Closed Able to stand 10 seconds safely   Standing Ubsupported with Feet Together Able to place feet together independently and stand 1 minute safely   From Standing, Reach Forward with Outstretched Arm Can reach confidently >25 cm (10")    From Standing Position, Pick up Object from Floor Able to pick up shoe safely and easily   From Standing Position, Turn to Look Behind Over each Shoulder Looks behind from both sides and weight shifts well   Turn 360 Degrees Able to turn 360 degrees safely but slowly   Standing Unsupported, Alternately Place Feet on Step/Stool Able to stand independently and safely and complete 8 steps in 20 seconds   Standing Unsupported, One Foot in Front Able to take small step independently and hold 30 seconds   Standing on One Leg Tries to lift leg/unable to hold 3 seconds but remains standing independently   Total Score 46   Berg comment: 50% risk for falls; improved from 07/29/16 which was 38/56        TREATMENT: Instructed patient in [place] Balance Assessment, 10 meter walk, 5 times sit<>Stand; see above; Patient did require min VCs for correct activity technique;  Negotiated 4 inch curb with SPC x3 reps with close supervision to independent; Required min VCs for correct sequencing;  Negotiated 4 steps with 1 rail and SPC in other hand x2 reps with cues for sequencing and foot placement;                     PT Education - 08/26/16 1317    Education provided Yes   Education Details progress towards goals, balance   Person(s) Educated Patient   Methods Explanation;Verbal cues   Comprehension Verbalized understanding;Returned demonstration;Verbal cues required             PT Long Term Goals - 08/26/16 1331      PT LONG TERM GOAL #1   Title Patient will be independent in HEP in order to increase patient's ability to maintain gains achieved in therapy and assist with return to PLOF.    Time 8   Period Weeks   Status On-going     PT LONG TERM GOAL #2   Title Patient will be able to perform 5x sit to stand with BUE in a safe manner in under 1 minute in order to perform transfers at supervision level.   Time 8   Period Weeks   Status Achieved     PT LONG TERM GOAL #3    Title Patient will increase her 10 m walk test time to greater than or equal to 0.75 m/s in order to be a community ambulator at decreased risk for falls.    Time 8   Period Weeks   Status Achieved     PT LONG TERM GOAL #4   Title Patient will increase Berg balance score to >45/56 in order to decrease patient's risk for falls during functional activities.    Time 8  Period Weeks   Status Achieved     PT LONG TERM GOAL #5   Title Patient will be able to negotiate one 4" curb with min. UE support independently in a safe manner in order to allow for improved and safe community ambulation.    Time 8   Period Weeks   Status Achieved     Additional Long Term Goals   Additional Long Term Goals Yes     PT LONG TERM GOAL #6   Title Patient will be independent in getting up/down from sofa/soft chair with 1 hand pushing up to improve mobility in home.   Time 4   Period Weeks   Status New     PT LONG TERM GOAL #7   Title Patient will be independent in descending stairs with 1 rail assist or less to improve safety in the community and at daughter's house.    Time 4   Period Weeks   Status New               Plan - 09-08-16 1342    Clinical Impression Statement Patient instructed in [place] assessment and other outcome measures. Patient demonstrates significant improvement in gait and transfer ability; She has met most goals. However patient reports continued difficulty with transfers from sofa/low seat and with descending stairs due to knee stiffness and weakness. She would benefit from additional skilled PT intervention to improve strength and mobility.    Rehab Potential Fair   Clinical Impairments Affecting Rehab Potential Negative Factors: Chronicity of issue, frequent falls, multiple joints with limited motion; Positive Factors: supportive husband, motivated; Clinical Impression: Evolving-multiple falls and mutliple co-morbidities affecting rehab   PT Frequency 2x / week    PT Duration 8 weeks   PT Treatment/Interventions ADLs/Self Care Home Management;Aquatic Therapy;Biofeedback;Canalith Repostioning;Cryotherapy;Electrical Stimulation;Moist Heat;DME Instruction;Gait training;Stair training;Functional mobility training;Therapeutic activities;Therapeutic exercise;Balance training;Neuromuscular re-education;Patient/family education;Manual techniques;Passive range of motion;Energy conservation   PT Next Visit Plan LE strengthening, balance activities, glut med strengthening with lateral step ups   PT Home Exercise Plan HEP maintained-see patient instructions   Consulted and Agree with Plan of Care Patient;Family member/caregiver      Patient will benefit from skilled therapeutic intervention in order to improve the following deficits and impairments:  Abnormal gait, Decreased activity tolerance, Decreased balance, Decreased coordination, Decreased endurance, Decreased mobility, Decreased range of motion, Decreased safety awareness, Decreased strength, Hypomobility, Increased edema, Impaired flexibility, Impaired sensation, Impaired UE functional use, Improper body mechanics, Postural dysfunction, Pain, Obesity  Visit Diagnosis: Unsteadiness on feet  Muscle weakness (generalized)  Difficulty in walking, not elsewhere classified       G-Codes - 2016/09/08 1343    Functional Assessment Tool Used sit<>stand, 10MWT, LEFS, ABC scale, Clinical Judgement   Functional Limitation Mobility: Walking and moving around   Mobility: Walking and Moving Around Current Status [phone]) At least 20 percent but less than 40 percent impaired, limited or restricted   Mobility: Walking and Moving Around Goal Status [phone]) At least 1 percent but less than 20 percent impaired, limited or restricted      Problem List Patient Active Problem List   Diagnosis Date Noted  . Absolute anemia 06/26/2015  . A-fib ([place]) 06/26/2015  . Hepatic cyst 06/26/2015  . BP (high blood pressure)  06/26/2015  . HLD (hyperlipidemia) 06/26/2015  . H/O: osteoarthritis 06/26/2015  . Arthritis, degenerative 06/26/2015  . Chronic kidney disease (CKD), stage III (moderate) 06/26/2015  . Osteopenia 06/26/2015  . Obstructive apnea 04/19/2015    Cameron Schwinn  PT, DPT 08/26/2016, 1:44 PM  [place] MAIN [facility] SERVICES [address] [place], [place], [address] Phone: [phone]   Fax:  [phone]  Name: Jaclyn Galvan MRN: [id number] Date of Birth: [birth year]

## 2016-09-02 ENCOUNTER — Ambulatory Visit: Payer: Medicare Other | Admitting: Physical Therapy

## 2016-09-02 ENCOUNTER — Encounter: Payer: Self-pay | Admitting: Physical Therapy

## 2016-09-02 VITALS — BP 160/78

## 2016-09-02 DIAGNOSIS — R2681 Unsteadiness on feet: Secondary | ICD-10-CM

## 2016-09-02 DIAGNOSIS — M6281 Muscle weakness (generalized): Secondary | ICD-10-CM

## 2016-09-02 DIAGNOSIS — R262 Difficulty in walking, not elsewhere classified: Secondary | ICD-10-CM

## 2016-09-02 NOTE — Therapy (Signed)
[place] MAIN [facility] SERVICES [address] [place], [place], [address] Phone: [phone]   Fax:  [phone]  Physical Therapy Treatment  Patient Details  Name: Jaclyn Galvan MRN: [id number] Date of Birth: 12-Mar-1941 Referring Provider: Dr. [name]  Encounter Date: 09/02/2016      PT End of Session - 09/02/16 1313    Visit Number 11   Number of Visits 17   Date for PT Re-Evaluation 09/18/16   Authorization Type GCodes 1/10   PT Start Time 1302   PT Stop Time 1345   PT Time Calculation (min) 43 min   Equipment Utilized During Treatment Gait belt   Activity Tolerance Patient tolerated treatment well   Behavior During Therapy [facility] for tasks assessed/performed;Flat affect      Past Medical History:  Diagnosis Date  . Anemia    resolved from the beginning of this year  . CKD (chronic kidney disease)   . 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 Elbow injury   . Fibromyalgia   . Hypercholesterolemia    controlled with meds  . Hypertension    controlled with meds, checks regularly  . Liver cyst   . Osteopenia   . PPD positive   . Skin cancer    recent, resolved  . Sleep apnea     Past Surgical History:  Procedure Laterality Date  . ADENOIDECTOMY    . APPENDECTOMY    . BACK SURGERY    . BILATERAL OOPHORECTOMY Right   . BREAST BIOPSY Right    1988 negative  . BREAST BIOPSY Right 09/25/2015   stereo unknown  . CHOLECYSTECTOMY    . DILATION AND CURETTAGE OF UTERUS  1966  . ESOPHAGOGASTRODUODENOSCOPY (EGD) WITH PROPOFOL N/A 07/31/2015   Procedure: ESOPHAGOGASTRODUODENOSCOPY (EGD) WITH PROPOFOL;  Surgeon: [name], MD;  Location: [facility] ENDOSCOPY;  Service: Endoscopy;  Laterality: N/A;  . ESOPHAGOGASTRODUODENOSCOPY (EGD) WITH PROPOFOL N/A 05/06/2016   Procedure: ESOPHAGOGASTRODUODENOSCOPY (EGD) WITH PROPOFOL;  Surgeon: [name], MD;  Location: [facility] ENDOSCOPY;  Service:  Endoscopy;  Laterality: N/A;  . TONSILLECTOMY    . TOTAL KNEE ARTHROPLASTY Bilateral    2001,2006  . TUBAL LIGATION  1971    Vitals:   09/02/16 1310  BP: (!) 160/78        Subjective Assessment - 09/02/16 1310    Subjective Patient reports doing well; She reports getting a good report from Dr. [name]; She denies any pain; She reports still needing to keep track of BP daily per MD request.    Patient is accompained by: Family member   Pertinent History Factors Affecting Rehab: decreased mobility in her knee, multiple joints with limited ROM, chornic issues for ~2 years   Limitations Walking   How long can you sit comfortably? N/A   How long can you stand comfortably? 1-2 hours   How long can you walk comfortably? very limited, notes she can walk 20-30 minutes with AD until she is unsafe   Patient Stated Goals improve getting up/down from chairs; become more balanced   Currently in Pain? No/denies           Treatment: BP assessed manually and is mildly high;  Warm up on Nust[place] BUE/BLE level 2 x4 min (unbilled);  Wall squats with pball, 2x12, min VCs for foot placement and to decrease knees moving over toes Forward step ups with opposite LE hip flexion (SLS) 3 sec hold with 1 rail  assist x5 bilaterally with min A for balance; Side step up on 4 inch step with 2inch airex pad x5 each direction with 2-1 rail assist with mod VCs to increase step length for better foot clearance; Side stepping on airex beam with red tband around ankles x3 laps each direction with rail assist for balance; Forward stand on beam with feet apart, unsupported, BUE ball toss x10 with posterior loss of balance requiring rail assist to keep balance;  Sit<>Stand from blue pball with therapist blocking ball for safety x10 without pushing on ball;  Educated patient in importance of scooting forward to edge of sofa prior to transfer; Also educated patient in importance of leaning forward and reaching towards  something to improve forward lean to increase transfer ability;    Patient required min VCs for balance stability, including to increase trunk control for less loss of balance with smaller base of support                        PT Education - 09/02/16 1312    Education provided Yes   Education Details strengthening, positioning, HEP   Person(s) Educated Patient   Methods Explanation;Verbal cues   Comprehension Verbalized understanding;Returned demonstration;Verbal cues required             PT Long Term Goals - 08/26/16 1331      PT LONG TERM GOAL #1   Title Patient will be independent in HEP in order to increase patient's ability to maintain gains achieved in therapy and assist with return to PLOF.    Time 8   Period Weeks   Status On-going     PT LONG TERM GOAL #2   Title Patient will be able to perform 5x sit to stand with BUE in a safe manner in under 1 minute in order to perform transfers at supervision level.   Time 8   Period Weeks   Status Achieved     PT LONG TERM GOAL #3   Title Patient will increase her 10 m walk test time to greater than or equal to 0.75 m/s in order to be a community ambulator at decreased risk for falls.    Time 8   Period Weeks   Status Achieved     PT LONG TERM GOAL #4   Title Patient will increase Berg balance score to >45/56 in order to decrease patient's risk for falls during functional activities.    Time 8   Period Weeks   Status Achieved     PT LONG TERM GOAL #5   Title Patient will be able to negotiate one 4" curb with min. UE support independently in a safe manner in order to allow for improved and safe community ambulation.    Time 8   Period Weeks   Status Achieved     Additional Long Term Goals   Additional Long Term Goals Yes     PT LONG TERM GOAL #6   Title Patient will be independent in getting up/down from sofa/soft chair with 1 hand pushing up to improve mobility in home.   Time 4   Period  Weeks   Status New     PT LONG TERM GOAL #7   Title Patient will be independent in descending stairs with 1 rail assist or less to improve safety in the community and at daughter's house.    Time 4   Period Weeks   Status New  Plan - 09/02/16 1404    Clinical Impression Statement Patient instructed in advanced LE strengthening and balance exercise. She demonstrates incresed posterior loss of balance and difficulty leaning forward which limits her ability to get up from a low seat. Patient would benefit from additional skilled PT intervention to improve balance/gait safety and reduce fall risk;    Rehab Potential Fair   Clinical Impairments Affecting Rehab Potential Negative Factors: Chronicity of issue, frequent falls, multiple joints with limited motion; Positive Factors: supportive husband, motivated; Clinical Impression: Evolving-multiple falls and mutliple co-morbidities affecting rehab   PT Frequency 2x / week   PT Duration 8 weeks   PT Treatment/Interventions ADLs/Self Care Home Management;Aquatic Therapy;Biofeedback;Canalith Repostioning;Cryotherapy;Electrical Stimulation;Moist Heat;DME Instruction;Gait training;Stair training;Functional mobility training;Therapeutic activities;Therapeutic exercise;Balance training;Neuromuscular re-education;Patient/family education;Manual techniques;Passive range of motion;Energy conservation   PT Next Visit Plan LE strengthening, balance activities, glut med strengthening with lateral step ups   PT Home Exercise Plan HEP maintained-see patient instructions   Consulted and Agree with Plan of Care Patient;Family member/caregiver      Patient will benefit from skilled therapeutic intervention in order to improve the following deficits and impairments:  Abnormal gait, Decreased activity tolerance, Decreased balance, Decreased coordination, Decreased endurance, Decreased mobility, Decreased range of motion, Decreased safety awareness,  Decreased strength, Hypomobility, Increased edema, Impaired flexibility, Impaired sensation, Impaired UE functional use, Improper body mechanics, Postural dysfunction, Pain, Obesity  Visit Diagnosis: Unsteadiness on feet  Muscle weakness (generalized)  Difficulty in walking, not elsewhere classified     Problem List Patient Active Problem List   Diagnosis Date Noted  . Absolute anemia 06/26/2015  . A-fib ([place]) 06/26/2015  . Hepatic cyst 06/26/2015  . BP (high blood pressure) 06/26/2015  . HLD (hyperlipidemia) 06/26/2015  . H/O: osteoarthritis 06/26/2015  . Arthritis, degenerative 06/26/2015  . Chronic kidney disease (CKD), stage III (moderate) 06/26/2015  . Osteopenia 06/26/2015  . Obstructive apnea 04/19/2015    Trotter,Margaret PT, DPT 09/02/2016, 2:32 PM  [place] MAIN [facility] SERVICES [address] [place], [place], [address] Phone: [phone]   Fax:  [phone]  Name: Jaclyn Galvan MRN: [id number] Date of Birth: [birth year]

## 2016-09-04 ENCOUNTER — Ambulatory Visit: Payer: Medicare Other | Admitting: Physical Therapy

## 2016-09-04 ENCOUNTER — Encounter: Payer: Self-pay | Admitting: Physical Therapy

## 2016-09-04 DIAGNOSIS — R262 Difficulty in walking, not elsewhere classified: Secondary | ICD-10-CM

## 2016-09-04 DIAGNOSIS — M6281 Muscle weakness (generalized): Secondary | ICD-10-CM

## 2016-09-04 DIAGNOSIS — R2681 Unsteadiness on feet: Secondary | ICD-10-CM

## 2016-09-04 NOTE — Therapy (Signed)
[place] MAIN [facility] SERVICES [address] [place], [place], [address] Phone: [phone]   Fax:  [phone]  Physical Therapy Treatment  Patient Details  Name: Jaclyn Galvan MRN: [id number] Date of Birth: Feb 19, 1941 Referring Provider: Dr. [name]  Encounter Date: 09/04/2016      PT End of Session - 09/04/16 1407    Visit Number 12   Number of Visits 17   Date for PT Re-Evaluation 09/18/16   Authorization Type GCodes 2/10   PT Start Time 1310   PT Stop Time 1348   PT Time Calculation (min) 38 min   Equipment Utilized During Treatment Gait belt   Activity Tolerance Patient tolerated treatment well   Behavior During Therapy [facility] for tasks assessed/performed;Flat affect      Past Medical History:  Diagnosis Date  . Anemia    resolved from the beginning of this year  . CKD (chronic kidney disease)   . 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 Elbow injury   . Fibromyalgia   . Hypercholesterolemia    controlled with meds  . Hypertension    controlled with meds, checks regularly  . Liver cyst   . Osteopenia   . PPD positive   . Skin cancer    recent, resolved  . Sleep apnea     Past Surgical History:  Procedure Laterality Date  . ADENOIDECTOMY    . APPENDECTOMY    . BACK SURGERY    . BILATERAL OOPHORECTOMY Right   . BREAST BIOPSY Right    1988 negative  . BREAST BIOPSY Right 09/25/2015   stereo unknown  . CHOLECYSTECTOMY    . DILATION AND CURETTAGE OF UTERUS  1966  . ESOPHAGOGASTRODUODENOSCOPY (EGD) WITH PROPOFOL N/A 07/31/2015   Procedure: ESOPHAGOGASTRODUODENOSCOPY (EGD) WITH PROPOFOL;  Surgeon: [name], MD;  Location: [facility] ENDOSCOPY;  Service: Endoscopy;  Laterality: N/A;  . ESOPHAGOGASTRODUODENOSCOPY (EGD) WITH PROPOFOL N/A 05/06/2016   Procedure: ESOPHAGOGASTRODUODENOSCOPY (EGD) WITH PROPOFOL;  Surgeon: [name], MD;  Location: [facility] ENDOSCOPY;  Service:  Endoscopy;  Laterality: N/A;  . TONSILLECTOMY    . TOTAL KNEE ARTHROPLASTY Bilateral    2001,2006  . TUBAL LIGATION  1971    There were no vitals filed for this visit.      Subjective Assessment - 09/04/16 1312    Subjective Patient reports doing well; She denies any pain; She reports, "I was running a little late today, I'm sorry about that."    Patient is accompained by: Family member   Pertinent History Factors Affecting Rehab: decreased mobility in her knee, multiple joints with limited ROM, chornic issues for ~2 years   Limitations Walking   How long can you sit comfortably? N/A   How long can you stand comfortably? 1-2 hours   How long can you walk comfortably? very limited, notes she can walk 20-30 minutes with AD until she is unsafe   Patient Stated Goals improve getting up/down from chairs; become more balanced   Currently in Pain? No/denies      Treatment:   Leg press, BLE plate 90# [id number], [id number] x 15; Patient required min Vcs for correct exercise technique;  Red tband hip flexion march x15 bilaterally with cues to increase hip flexion for better strengthening;  Forward step up on 6 inch step (step plus 1 riser) x5 leading with each foot with 1 rail assist only; close supervision and min VCs to increase forward lean for  better foot clearance;  Side stepping on firm surface with red tband around ankles x3 laps each direction with rail assist for balance;  Sit<>stand from mat table with 2 airex pads under feet to simulate a low seated couch, with max VCs and demonstration to improve forward lean; patient demonstrates significant difficulty with posterior lean during trials. She was given tactile cues and objects to reach towards to reduce posterior lean and facilitate a forward lean.  Patient required min A for sit<>Stand in this position and unable to achieve a full standing position with balance;  Sit<>stand from low mat (without airex pad) with ball touch to walker to  facilitate a forward lean x10 reps;                           PT Education - 09/04/16 1357    Education provided Yes   Education Details strengthening, transfer;    Person(s) Educated Patient   Methods Explanation;Verbal cues   Comprehension Verbalized understanding;Returned demonstration;Verbal cues required             PT Long Term Goals - 08/26/16 1331      PT LONG TERM GOAL #1   Title Patient will be independent in HEP in order to increase patient's ability to maintain gains achieved in therapy and assist with return to PLOF.    Time 8   Period Weeks   Status On-going     PT LONG TERM GOAL #2   Title Patient will be able to perform 5x sit to stand with BUE in a safe manner in under 1 minute in order to perform transfers at supervision level.   Time 8   Period Weeks   Status Achieved     PT LONG TERM GOAL #3   Title Patient will increase her 10 m walk test time to greater than or equal to 0.75 m/s in order to be a community ambulator at decreased risk for falls.    Time 8   Period Weeks   Status Achieved     PT LONG TERM GOAL #4   Title Patient will increase Berg balance score to >45/56 in order to decrease patient's risk for falls during functional activities.    Time 8   Period Weeks   Status Achieved     PT LONG TERM GOAL #5   Title Patient will be able to negotiate one 4" curb with min. UE support independently in a safe manner in order to allow for improved and safe community ambulation.    Time 8   Period Weeks   Status Achieved     Additional Long Term Goals   Additional Long Term Goals Yes     PT LONG TERM GOAL #6   Title Patient will be independent in getting up/down from sofa/soft chair with 1 hand pushing up to improve mobility in home.   Time 4   Period Weeks   Status New     PT LONG TERM GOAL #7   Title Patient will be independent in descending stairs with 1 rail assist or less to improve safety in the community and at  daughter's house.    Time 4   Period Weeks   Status New               Plan - 09/04/16 1408    Clinical Impression Statement Patient instructed in advanced LE strengthening and balance exercise. She was educated in transfers from low  seat with max VCs and demonstration to improve forward lean. Patient has tendency to loose balance backwards which limits her transfer ability. She would benefit from additional skilled PT intervention to improve strength and reduce fall risk;    Rehab Potential Fair   Clinical Impairments Affecting Rehab Potential Negative Factors: Chronicity of issue, frequent falls, multiple joints with limited motion; Positive Factors: supportive husband, motivated; Clinical Impression: Evolving-multiple falls and mutliple co-morbidities affecting rehab   PT Frequency 2x / week   PT Duration 8 weeks   PT Treatment/Interventions ADLs/Self Care Home Management;Aquatic Therapy;Biofeedback;Canalith Repostioning;Cryotherapy;Electrical Stimulation;Moist Heat;DME Instruction;Gait training;Stair training;Functional mobility training;Therapeutic activities;Therapeutic exercise;Balance training;Neuromuscular re-education;Patient/family education;Manual techniques;Passive range of motion;Energy conservation   PT Next Visit Plan LE strengthening, balance activities, glut med strengthening with lateral step ups   PT Home Exercise Plan HEP maintained-see patient instructions   Consulted and Agree with Plan of Care Patient;Family member/caregiver      Patient will benefit from skilled therapeutic intervention in order to improve the following deficits and impairments:  Abnormal gait, Decreased activity tolerance, Decreased balance, Decreased coordination, Decreased endurance, Decreased mobility, Decreased range of motion, Decreased safety awareness, Decreased strength, Hypomobility, Increased edema, Impaired flexibility, Impaired sensation, Impaired UE functional use, Improper body  mechanics, Postural dysfunction, Pain, Obesity  Visit Diagnosis: Unsteadiness on feet  Muscle weakness (generalized)  Difficulty in walking, not elsewhere classified     Problem List Patient Active Problem List   Diagnosis Date Noted  . Absolute anemia 06/26/2015  . A-fib ([place]) 06/26/2015  . Hepatic cyst 06/26/2015  . BP (high blood pressure) 06/26/2015  . HLD (hyperlipidemia) 06/26/2015  . H/O: osteoarthritis 06/26/2015  . Arthritis, degenerative 06/26/2015  . Chronic kidney disease (CKD), stage III (moderate) 06/26/2015  . Osteopenia 06/26/2015  . Obstructive apnea 04/19/2015    Jaclyn Galvan PT, DPT 09/04/2016, 2:12 PM  [place] MAIN [facility] SERVICES [address] [place], [place], [address] Phone: [phone]   Fax:  [phone]  Name: Jaclyn Galvan MRN: [id number] Date of Birth: [birth year]

## 2016-09-08 ENCOUNTER — Ambulatory Visit: Payer: Medicare Other | Admitting: Physical Therapy

## 2016-09-09 ENCOUNTER — Encounter: Payer: Medicare Other | Admitting: Physical Therapy

## 2016-09-10 ENCOUNTER — Encounter: Payer: Self-pay | Admitting: Physical Therapy

## 2016-09-10 ENCOUNTER — Ambulatory Visit: Payer: Medicare Other | Attending: Internal Medicine | Admitting: Physical Therapy

## 2016-09-10 DIAGNOSIS — R2681 Unsteadiness on feet: Secondary | ICD-10-CM | POA: Insufficient documentation

## 2016-09-10 DIAGNOSIS — M6281 Muscle weakness (generalized): Secondary | ICD-10-CM | POA: Diagnosis present

## 2016-09-10 DIAGNOSIS — R262 Difficulty in walking, not elsewhere classified: Secondary | ICD-10-CM | POA: Diagnosis present

## 2016-09-10 NOTE — Therapy (Signed)
[place] MAIN [facility] SERVICES [address] [place], [place], [address] Phone: [phone]   Fax:  [phone]  Physical Therapy Treatment  Patient Details  Name: Jaclyn Galvan MRN: [id number] Date of Birth: [birth year] Referring Provider: Dr. [name]  Encounter Date: 09/10/2016      PT End of Session - 09/10/16 1631    Visit Number 13   Number of Visits 17   Date for PT Re-Evaluation 09/18/16   Authorization Type GCodes 3/10   PT Start Time [id number]   PT Stop Time 1430   PT Time Calculation (min) 45 min   Equipment Utilized During Treatment Gait belt   Activity Tolerance Patient tolerated treatment well   Behavior During Therapy [facility] for tasks assessed/performed;Flat affect      Past Medical History:  Diagnosis Date  . Anemia    resolved from the beginning of this year  . CKD (chronic kidney disease)   . 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 Elbow injury   . Fibromyalgia   . Hypercholesterolemia    controlled with meds  . Hypertension    controlled with meds, checks regularly  . Liver cyst   . Osteopenia   . PPD positive   . Skin cancer    recent, resolved  . Sleep apnea     Past Surgical History:  Procedure Laterality Date  . ADENOIDECTOMY    . APPENDECTOMY    . BACK SURGERY    . BILATERAL OOPHORECTOMY Right   . BREAST BIOPSY Right    1988 negative  . BREAST BIOPSY Right 09/25/2015   stereo unknown  . CHOLECYSTECTOMY    . DILATION AND CURETTAGE OF UTERUS  1966  . ESOPHAGOGASTRODUODENOSCOPY (EGD) WITH PROPOFOL N/A 07/31/2015   Procedure: ESOPHAGOGASTRODUODENOSCOPY (EGD) WITH PROPOFOL;  Surgeon: [name], MD;  Location: [facility] ENDOSCOPY;  Service: Endoscopy;  Laterality: N/A;  . ESOPHAGOGASTRODUODENOSCOPY (EGD) WITH PROPOFOL N/A 05/06/2016   Procedure: ESOPHAGOGASTRODUODENOSCOPY (EGD) WITH PROPOFOL;  Surgeon: [name], MD;  Location: [facility] ENDOSCOPY;  Service:  Endoscopy;  Laterality: N/A;  . TONSILLECTOMY    . TOTAL KNEE ARTHROPLASTY Bilateral    2001,2006  . TUBAL LIGATION  1971    There were no vitals filed for this visit.      Subjective Assessment - 09/10/16 1401    Subjective Patient reports still having trouble with getting up off her sofa. She reports compliance with HEP;    Patient is accompained by: Family member   Pertinent History Factors Affecting Rehab: decreased mobility in her knee, multiple joints with limited ROM, chornic issues for ~2 years   Limitations Walking   How long can you sit comfortably? N/A   How long can you stand comfortably? 1-2 hours   How long can you walk comfortably? very limited, notes she can walk 20-30 minutes with AD until she is unsafe   Patient Stated Goals improve getting up/down from chairs; become more balanced   Currently in Pain? No/denies          Treatment:  Rockerboard teeter forward/backward with 2-0 rail assist with max VCs and demonstration for improved hip/ankle strategies for better postural control and trunk position;  1/2 foam, flat side up, BUE wand flexion with cues for hip/ankle strategies for better trunk control;   Standing unsupported, heel/toe raises x10 with cues to not hold onto rail to improve trunk control and hip strategies.   Sit<>stand from mat table  with 1 airex pads under feet to simulate a low seated couch, with max VCs and demonstration to improve forward lean; patient demonstrates significant difficulty with posterior lean during trials. She was given tactile cues and objects to reach towards to reduce posterior lean and facilitate a forward lean.  x3 reps at raised height with min A to supervision x3 reps at lower height x2 reps at full low height with max VCs and CGA for sit<>stand; Patient demonstrates increased posterior lean with each attempt;  Sit<>Stand from regular seat without arm rests x2 reps with posterior lean requiring max VCs and tactile  cues to increase forward lean;  Patient able to transfer with forward lean but with initial transfer will fall back in chair due to posterior lean;  BP after session 142/88 in LUE                           PT Education - 09/10/16 1631    Education provided Yes   Education Details balance, hip/ankle strategies, transfer ability   Person(s) Educated Patient   Methods Explanation;Verbal cues   Comprehension Verbalized understanding;Returned demonstration;Verbal cues required             PT Long Term Goals - 08/26/16 1331      PT LONG TERM GOAL #1   Title Patient will be independent in HEP in order to increase patient's ability to maintain gains achieved in therapy and assist with return to PLOF.    Time 8   Period Weeks   Status On-going     PT LONG TERM GOAL #2   Title Patient will be able to perform 5x sit to stand with BUE in a safe manner in under 1 minute in order to perform transfers at supervision level.   Time 8   Period Weeks   Status Achieved     PT LONG TERM GOAL #3   Title Patient will increase her 10 m walk test time to greater than or equal to 0.75 m/s in order to be a community ambulator at decreased risk for falls.    Time 8   Period Weeks   Status Achieved     PT LONG TERM GOAL #4   Title Patient will increase Berg balance score to >45/56 in order to decrease patient's risk for falls during functional activities.    Time 8   Period Weeks   Status Achieved     PT LONG TERM GOAL #5   Title Patient will be able to negotiate one 4" curb with min. UE support independently in a safe manner in order to allow for improved and safe community ambulation.    Time 8   Period Weeks   Status Achieved     Additional Long Term Goals   Additional Long Term Goals Yes     PT LONG TERM GOAL #6   Title Patient will be independent in getting up/down from sofa/soft chair with 1 hand pushing up to improve mobility in home.   Time 4   Period  Weeks   Status New     PT LONG TERM GOAL #7   Title Patient will be independent in descending stairs with 1 rail assist or less to improve safety in the community and at daughter's house.    Time 4   Period Weeks   Status New               Plan - 09/10/16 1631  Clinical Impression Statement Patient instructed in advanced balance exercise to faciliate better hip strategies for increased trunk control. Patient has significant challenge with initiating hip strategies with impaired trunk control with uneven foot positioning. Patient demonstrates increased posterior lean with standing and with sit<>Stand transfers. Her posterior lean is what limits most of her ability with sit<>Stand transfers. Patient would benefit from additional skilled PT intervention to improve balance/gait safety and improve strength for better mobility;    Rehab Potential Fair   Clinical Impairments Affecting Rehab Potential Negative Factors: Chronicity of issue, frequent falls, multiple joints with limited motion; Positive Factors: supportive husband, motivated; Clinical Impression: Evolving-multiple falls and mutliple co-morbidities affecting rehab   PT Frequency 2x / week   PT Duration 8 weeks   PT Treatment/Interventions ADLs/Self Care Home Management;Aquatic Therapy;Biofeedback;Canalith Repostioning;Cryotherapy;Electrical Stimulation;Moist Heat;DME Instruction;Gait training;Stair training;Functional mobility training;Therapeutic activities;Therapeutic exercise;Balance training;Neuromuscular re-education;Patient/family education;Manual techniques;Passive range of motion;Energy conservation   PT Next Visit Plan LE strengthening, balance activities, glut med strengthening with lateral step ups   PT Home Exercise Plan HEP maintained-see patient instructions   Consulted and Agree with Plan of Care Patient;Family member/caregiver      Patient will benefit from skilled therapeutic intervention in order to improve the  following deficits and impairments:  Abnormal gait, Decreased activity tolerance, Decreased balance, Decreased coordination, Decreased endurance, Decreased mobility, Decreased range of motion, Decreased safety awareness, Decreased strength, Hypomobility, Increased edema, Impaired flexibility, Impaired sensation, Impaired UE functional use, Improper body mechanics, Postural dysfunction, Pain, Obesity  Visit Diagnosis: Unsteadiness on feet  Muscle weakness (generalized)  Difficulty in walking, not elsewhere classified     Problem List Patient Active Problem List   Diagnosis Date Noted  . Absolute anemia 06/26/2015  . A-fib ([place]) 06/26/2015  . Hepatic cyst 06/26/2015  . BP (high blood pressure) 06/26/2015  . HLD (hyperlipidemia) 06/26/2015  . H/O: osteoarthritis 06/26/2015  . Arthritis, degenerative 06/26/2015  . Chronic kidney disease (CKD), stage III (moderate) 06/26/2015  . Osteopenia 06/26/2015  . Obstructive apnea 04/19/2015    Trotter,Margaret PT, DPT 09/10/2016, 4:35 PM  [place] MAIN [facility] SERVICES [address] [place], [place], [address] Phone: [phone]   Fax:  [phone]  Name: Jaclyn Galvan MRN: [id number] Date of Birth: 1941-05-22

## 2016-09-15 ENCOUNTER — Encounter: Payer: Medicare Other | Admitting: Physical Therapy

## 2016-09-16 ENCOUNTER — Ambulatory Visit: Payer: Medicare Other | Admitting: Physical Therapy

## 2016-09-16 ENCOUNTER — Encounter: Payer: Self-pay | Admitting: Physical Therapy

## 2016-09-16 ENCOUNTER — Encounter: Payer: Medicare Other | Admitting: Physical Therapy

## 2016-09-16 DIAGNOSIS — R2681 Unsteadiness on feet: Secondary | ICD-10-CM | POA: Diagnosis not present

## 2016-09-16 DIAGNOSIS — M6281 Muscle weakness (generalized): Secondary | ICD-10-CM

## 2016-09-16 DIAGNOSIS — R262 Difficulty in walking, not elsewhere classified: Secondary | ICD-10-CM

## 2016-09-16 NOTE — Therapy (Signed)
[place] MAIN [facility] SERVICES [address] [place], [place], [address] Phone: [phone]   Fax:  [phone]  Physical Therapy Treatment  Patient Details  Name: Jaclyn Galvan MRN: [id number] Date of Birth: [birth year] Referring Provider: Dr. [name]  Encounter Date: 09/16/2016      PT End of Session - 09/16/16 1514    Visit Number 14   Number of Visits 17   Date for PT Re-Evaluation 09/18/16   Authorization Type GCodes 4/10   PT Start Time 1345   PT Stop Time 1430   PT Time Calculation (min) 45 min   Equipment Utilized During Treatment Gait belt   Activity Tolerance Patient tolerated treatment well   Behavior During Therapy [facility] for tasks assessed/performed;Flat affect      Past Medical History:  Diagnosis Date  . Anemia    resolved from the beginning of this year  . CKD (chronic kidney disease)   . 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 Elbow injury   . Fibromyalgia   . Hypercholesterolemia    controlled with meds  . Hypertension    controlled with meds, checks regularly  . Liver cyst   . Osteopenia   . PPD positive   . Skin cancer    recent, resolved  . Sleep apnea     Past Surgical History:  Procedure Laterality Date  . ADENOIDECTOMY    . APPENDECTOMY    . BACK SURGERY    . BILATERAL OOPHORECTOMY Right   . BREAST BIOPSY Right    1988 negative  . BREAST BIOPSY Right 09/25/2015   stereo unknown  . CHOLECYSTECTOMY    . DILATION AND CURETTAGE OF UTERUS  1966  . ESOPHAGOGASTRODUODENOSCOPY (EGD) WITH PROPOFOL N/A 07/31/2015   Procedure: ESOPHAGOGASTRODUODENOSCOPY (EGD) WITH PROPOFOL;  Surgeon: [name], MD;  Location: [facility] ENDOSCOPY;  Service: Endoscopy;  Laterality: N/A;  . ESOPHAGOGASTRODUODENOSCOPY (EGD) WITH PROPOFOL N/A 05/06/2016   Procedure: ESOPHAGOGASTRODUODENOSCOPY (EGD) WITH PROPOFOL;  Surgeon: [name], MD;  Location: [facility] ENDOSCOPY;  Service:  Endoscopy;  Laterality: N/A;  . TONSILLECTOMY    . TOTAL KNEE ARTHROPLASTY Bilateral    2001,2006  . TUBAL LIGATION  1971    There were no vitals filed for this visit.      Subjective Assessment - 09/16/16 1514    Subjective Patient reports doing well; She reports, "I am taking cymbalta for my fibromyalgia and I think that a side effect is poor proprioception"    Patient is accompained by: Family member   Pertinent History Factors Affecting Rehab: decreased mobility in her knee, multiple joints with limited ROM, chornic issues for ~2 years   Limitations Walking   How long can you sit comfortably? N/A   How long can you stand comfortably? 1-2 hours   How long can you walk comfortably? very limited, notes she can walk 20-30 minutes with AD until she is unsafe   Patient Stated Goals improve getting up/down from chairs; become more balanced   Currently in Pain? No/denies        Treatment: BP at start of session 148/70 in LUE  Rockerboard teeter forward/backward with 2-0 rail assist with max VCs and demonstration for improved hip/ankle strategies for better postural control and trunk position;  1/2 foam, flat side up: Heel/toe raises x1 min with rail assist, progressing to no rail assist; BUE wand chest press with cues for hip/ankle strategies for better trunk control;  Sit<>stand from pball x5 reps with cues to maintain forward trunk lean in stance to avoid falling backwards on pball;   Sit<>stand from mat table starting at raised height and progressing to lowest height without UE pushing on table x10 reps; Patient able to demonstrate better forward lean with initial sit to stand but would occasional demonstrate significant posterior lean upon standing requiring mod Vcs to advance weight forward for continued balance;  Sit<>Stand from regular seat without arm rests x2 reps with better forward lean and less loss of balance;  Resisted weighted gait 12.5# x2 laps  demonstrating better forward lean. However patient required min A and mod Vcs to maintain forward lean with backwards walking and to slow down eccentric return for better balance control;                             PT Education - 09/16/16 1514    Education provided Yes   Education Details balance, hip/ankle strategies, transfers   Person(s) Educated Patient   Methods Explanation;Verbal cues   Comprehension Verbalized understanding;Returned demonstration;Verbal cues required             PT Long Term Goals - 08/26/16 1331      PT LONG TERM GOAL #1   Title Patient will be independent in HEP in order to increase patient's ability to maintain gains achieved in therapy and assist with return to PLOF.    Time 8   Period Weeks   Status On-going     PT LONG TERM GOAL #2   Title Patient will be able to perform 5x sit to stand with BUE in a safe manner in under 1 minute in order to perform transfers at supervision level.   Time 8   Period Weeks   Status Achieved     PT LONG TERM GOAL #3   Title Patient will increase her 10 m walk test time to greater than or equal to 0.75 m/s in order to be a community ambulator at decreased risk for falls.    Time 8   Period Weeks   Status Achieved     PT LONG TERM GOAL #4   Title Patient will increase Berg balance score to >45/56 in order to decrease patient's risk for falls during functional activities.    Time 8   Period Weeks   Status Achieved     PT LONG TERM GOAL #5   Title Patient will be able to negotiate one 4" curb with min. UE support independently in a safe manner in order to allow for improved and safe community ambulation.    Time 8   Period Weeks   Status Achieved     Additional Long Term Goals   Additional Long Term Goals Yes     PT LONG TERM GOAL #6   Title Patient will be independent in getting up/down from sofa/soft chair with 1 hand pushing up to improve mobility in home.   Time 4   Period  Weeks   Status New     PT LONG TERM GOAL #7   Title Patient will be independent in descending stairs with 1 rail assist or less to improve safety in the community and at daughter's house.    Time 4   Period Weeks   Status New               Plan - 09/16/16 1515    Clinical Impression Statement Patient able  to demonstrate better hip/ankle strategies today as compared to previous sessions; She still requires min-mod A to recover balance with quick backwards weight shift having difficulty to quickly initiating forward hip flexion. Patient demonstrates improved transfer ability being able to get out of chair without UE suport consistently with better stance control; She would benefit from additional skilled PT intervention to improve transfers, balance and gait safety;    Rehab Potential Fair   Clinical Impairments Affecting Rehab Potential Negative Factors: Chronicity of issue, frequent falls, multiple joints with limited motion; Positive Factors: supportive husband, motivated; Clinical Impression: Evolving-multiple falls and mutliple co-morbidities affecting rehab   PT Frequency 2x / week   PT Duration 8 weeks   PT Treatment/Interventions ADLs/Self Care Home Management;Aquatic Therapy;Biofeedback;Canalith Repostioning;Cryotherapy;Electrical Stimulation;Moist Heat;DME Instruction;Gait training;Stair training;Functional mobility training;Therapeutic activities;Therapeutic exercise;Balance training;Neuromuscular re-education;Patient/family education;Manual techniques;Passive range of motion;Energy conservation   PT Next Visit Plan LE strengthening, balance activities, glut med strengthening with lateral step ups   PT Home Exercise Plan HEP maintained-see patient instructions   Consulted and Agree with Plan of Care Patient;Family member/caregiver      Patient will benefit from skilled therapeutic intervention in order to improve the following deficits and impairments:  Abnormal gait,  Decreased activity tolerance, Decreased balance, Decreased coordination, Decreased endurance, Decreased mobility, Decreased range of motion, Decreased safety awareness, Decreased strength, Hypomobility, Increased edema, Impaired flexibility, Impaired sensation, Impaired UE functional use, Improper body mechanics, Postural dysfunction, Pain, Obesity  Visit Diagnosis: Unsteadiness on feet  Muscle weakness (generalized)  Difficulty in walking, not elsewhere classified     Problem List Patient Active Problem List   Diagnosis Date Noted  . Absolute anemia 06/26/2015  . A-fib ([place]) 06/26/2015  . Hepatic cyst 06/26/2015  . BP (high blood pressure) 06/26/2015  . HLD (hyperlipidemia) 06/26/2015  . H/O: osteoarthritis 06/26/2015  . Arthritis, degenerative 06/26/2015  . Chronic kidney disease (CKD), stage III (moderate) 06/26/2015  . Osteopenia 06/26/2015  . Obstructive apnea 04/19/2015    Higinio Grow PT, DPT 09/16/2016, 3:16 PM  [place] MAIN [facility] SERVICES [address] [place], [place], [address] Phone: [phone]   Fax:  [phone]  Name: Jaclyn Galvan MRN: [id number] Date of Birth: [birth year]

## 2016-09-17 ENCOUNTER — Encounter: Payer: Medicare Other | Admitting: Physical Therapy

## 2016-09-23 ENCOUNTER — Encounter: Payer: Self-pay | Admitting: Physical Therapy

## 2016-09-23 ENCOUNTER — Ambulatory Visit: Payer: Medicare Other | Admitting: Physical Therapy

## 2016-09-23 DIAGNOSIS — M6281 Muscle weakness (generalized): Secondary | ICD-10-CM

## 2016-09-23 DIAGNOSIS — R2681 Unsteadiness on feet: Secondary | ICD-10-CM

## 2016-09-23 DIAGNOSIS — R262 Difficulty in walking, not elsewhere classified: Secondary | ICD-10-CM

## 2016-09-23 NOTE — Therapy (Signed)
[place] MAIN [facility] SERVICES [address] [place], [place], [address] Phone: [phone]   Fax:  [phone]  Physical Therapy Treatment/Progress Note  Patient Details  Name: Jaclyn Galvan MRN: [id number] Date of Birth: 02-03-1941 Referring Provider: Dr. [name]  Encounter Date: 09/23/2016      PT End of Session - 09/23/16 1447    Visit Number 15   Number of Visits 21   Date for PT Re-Evaluation 10/21/16   Authorization Type GCodes 5/10   PT Start Time 1440   PT Stop Time 1500   PT Time Calculation (min) 20 min   Equipment Utilized During Treatment Gait belt   Activity Tolerance Patient tolerated treatment well   Behavior During Therapy [facility] for tasks assessed/performed;Flat affect      Past Medical History:  Diagnosis Date  . Anemia    resolved from the beginning of this year  . CKD (chronic kidney disease)   . 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 Elbow injury   . Fibromyalgia   . Hypercholesterolemia    controlled with meds  . Hypertension    controlled with meds, checks regularly  . Liver cyst   . Osteopenia   . PPD positive   . Skin cancer    recent, resolved  . Sleep apnea     Past Surgical History:  Procedure Laterality Date  . ADENOIDECTOMY    . APPENDECTOMY    . BACK SURGERY    . BILATERAL OOPHORECTOMY Right   . BREAST BIOPSY Right    1988 negative  . BREAST BIOPSY Right 09/25/2015   stereo unknown  . CHOLECYSTECTOMY    . DILATION AND CURETTAGE OF UTERUS  1966  . ESOPHAGOGASTRODUODENOSCOPY (EGD) WITH PROPOFOL N/A 07/31/2015   Procedure: ESOPHAGOGASTRODUODENOSCOPY (EGD) WITH PROPOFOL;  Surgeon: [name], MD;  Location: [facility] ENDOSCOPY;  Service: Endoscopy;  Laterality: N/A;  . ESOPHAGOGASTRODUODENOSCOPY (EGD) WITH PROPOFOL N/A 05/06/2016   Procedure: ESOPHAGOGASTRODUODENOSCOPY (EGD) WITH PROPOFOL;  Surgeon: [name], MD;  Location: [facility] ENDOSCOPY;   Service: Endoscopy;  Laterality: N/A;  . TONSILLECTOMY    . TOTAL KNEE ARTHROPLASTY Bilateral    2001,2006  . TUBAL LIGATION  1971    There were no vitals filed for this visit.      Subjective Assessment - 09/23/16 1446    Subjective patient reports still having trouble with getting up from sofa and going up/down stairs. She reports, "I think it might be a little easier but its still hard."    Patient is accompained by: Family member   Pertinent History Factors Affecting Rehab: decreased mobility in her knee, multiple joints with limited ROM, chornic issues for ~2 years   Limitations Walking   How long can you sit comfortably? N/A   How long can you stand comfortably? 1-2 hours   How long can you walk comfortably? very limited, notes she can walk 20-30 minutes with AD until she is unsafe   Patient Stated Goals improve getting up/down from chairs; become more balanced   Currently in Pain? No/denies            [facility] PT Assessment - 09/23/16 0001      Standardized Balance Assessment   Five times sit to stand comments  18 sec with 1 HHA (>15 sec indicates high risk for falls); improved form 08/26/16 which was 35 sec with 2 HHA   10 Meter Walk 0.86 m/s with rollator; home  ambulator; improved from 08/26/16 which was 0.8 m/s      Treatment: BP at start of session 140/70 in LUE Warm up on Nustep BUE/BLE level 2 x5 min (unbilled);   Instructed patient in outcome measures, see above;  Patient ascend/descend 4 steps with 1 rail assist x3 sets with min Vcs to advance foot forward on step prior to descending for better foot clearance; Patient able to ascend/descend with 1 rail supervision with good mobility and balance;                    PT Education - 09/23/16 1446    Education provided Yes   Education Details progress towards goals;    Person(s) Educated Patient   Methods Explanation;Verbal cues   Comprehension Verbalized understanding;Returned  demonstration;Verbal cues required             PT Long Term Goals - 09/23/16 1449      PT LONG TERM GOAL #1   Title Patient will be independent in HEP in order to increase patient's ability to maintain gains achieved in therapy and assist with return to PLOF.    Time 4   Period Weeks   Status On-going     PT LONG TERM GOAL #2   Title Patient will be able to perform 5x sit to stand with BUE in a safe manner in under 1 minute in order to perform transfers at supervision level.   Time 4   Period Weeks   Status Achieved     PT LONG TERM GOAL #3   Title Patient will increase her 10 m walk test time to greater than or equal to 0.75 m/s in order to be a community ambulator at decreased risk for falls.    Time 4   Period Weeks   Status Achieved     PT LONG TERM GOAL #4   Title Patient will increase Berg balance score to >45/56 in order to decrease patient's risk for falls during functional activities.    Time 4   Period Weeks   Status Achieved     PT LONG TERM GOAL #5   Title Patient will be able to negotiate one 4" curb with min. UE support independently in a safe manner in order to allow for improved and safe community ambulation.    Time 4   Period Weeks   Status Achieved     PT LONG TERM GOAL #6   Title Patient will be independent in getting up/down from sofa/soft chair with 1 hand pushing up to improve mobility in home.   Time 4   Period Weeks   Status Partially Met     PT LONG TERM GOAL #7   Title Patient will be independent in descending stairs with 1 rail assist or less to improve safety in the community and at daughter's house.    Time 4   Period Weeks   Status Partially Met               Plan - 09/23/16 1655    Clinical Impression Statement Patient instructed in outcome measures to asses progress towards goals; She demonstrates improved transfer ability and ability to negotiate stairs. She reports not feeling confident when descending stairs due to LE  knee discomfort. Patient continues to have posterior lean with movement requiring cues for better hip strategy to improve balance. she would benefit from additional skilled PT intervention to improve balance/gait safety and return to PLOF.    Rehab Potential  Fair   Clinical Impairments Affecting Rehab Potential Negative Factors: Chronicity of issue, frequent falls, multiple joints with limited motion; Positive Factors: supportive husband, motivated; Clinical Impression: Evolving-multiple falls and mutliple co-morbidities affecting rehab   PT Frequency 1x / week   PT Duration 4 weeks   PT Treatment/Interventions ADLs/Self Care Home Management;Aquatic Therapy;Biofeedback;Canalith Repostioning;Cryotherapy;Electrical Stimulation;Moist Heat;DME Instruction;Gait training;Stair training;Functional mobility training;Therapeutic activities;Therapeutic exercise;Balance training;Neuromuscular re-education;Patient/family education;Manual techniques;Passive range of motion;Energy conservation   PT Next Visit Plan LE strengthening, balance activities, glut med strengthening with lateral step ups   PT Home Exercise Plan HEP maintained-see patient instructions   Consulted and Agree with Plan of Care Patient;Family member/caregiver      Patient will benefit from skilled therapeutic intervention in order to improve the following deficits and impairments:  Abnormal gait, Decreased activity tolerance, Decreased balance, Decreased coordination, Decreased endurance, Decreased mobility, Decreased range of motion, Decreased safety awareness, Decreased strength, Hypomobility, Increased edema, Impaired flexibility, Impaired sensation, Impaired UE functional use, Improper body mechanics, Postural dysfunction, Pain, Obesity  Visit Diagnosis: Unsteadiness on feet - Plan: PT plan of care cert/re-cert  Muscle weakness (generalized) - Plan: PT plan of care cert/re-cert  Difficulty in walking, not elsewhere classified - Plan: PT  plan of care cert/re-cert     Problem List Patient Active Problem List   Diagnosis Date Noted  . Absolute anemia 06/26/2015  . A-fib ([place]) 06/26/2015  . Hepatic cyst 06/26/2015  . BP (high blood pressure) 06/26/2015  . HLD (hyperlipidemia) 06/26/2015  . H/O: osteoarthritis 06/26/2015  . Arthritis, degenerative 06/26/2015  . Chronic kidney disease (CKD), stage III (moderate) 06/26/2015  . Osteopenia 06/26/2015  . Obstructive apnea 04/19/2015    Nyla Creason PT, DPT 09/23/2016, 5:00 PM  [place] MAIN [facility] SERVICES [address] [place], [place], [address] Phone: [phone]   Fax:  [phone]  Name: Jaclyn Galvan MRN: [id number] Date of Birth: 29-Jun-1941

## 2016-09-24 ENCOUNTER — Ambulatory Visit: Payer: Medicare Other | Admitting: Physical Therapy

## 2016-09-30 ENCOUNTER — Encounter: Payer: Medicare Other | Admitting: Physical Therapy

## 2016-10-02 ENCOUNTER — Ambulatory Visit: Payer: Medicare Other | Admitting: Physical Therapy

## 2016-10-02 ENCOUNTER — Encounter: Payer: Self-pay | Admitting: Physical Therapy

## 2016-10-02 VITALS — BP 138/80

## 2016-10-02 DIAGNOSIS — M6281 Muscle weakness (generalized): Secondary | ICD-10-CM

## 2016-10-02 DIAGNOSIS — R2681 Unsteadiness on feet: Secondary | ICD-10-CM | POA: Diagnosis not present

## 2016-10-02 DIAGNOSIS — R262 Difficulty in walking, not elsewhere classified: Secondary | ICD-10-CM

## 2016-10-02 NOTE — Therapy (Signed)
[place] MAIN [facility] SERVICES [address] [place], [place], [address] Phone: [phone]   Fax:  [phone]  Physical Therapy Treatment  Patient Details  Name: Jaclyn Galvan MRN: [id number] Date of Birth: 1941-08-16 Referring Provider: Dr. [name]  Encounter Date: 10/02/2016      PT End of Session - 10/02/16 1432    Visit Number 16   Number of Visits 21   Date for PT Re-Evaluation 10/21/16   Authorization Type GCodes 6/10   PT Start Time 1432   PT Stop Time 1515   PT Time Calculation (min) 43 min   Equipment Utilized During Treatment Gait belt   Activity Tolerance Patient tolerated treatment well   Behavior During Therapy [facility] for tasks assessed/performed;Flat affect      Past Medical History:  Diagnosis Date  . Anemia    resolved from the beginning of this year  . CKD (chronic kidney disease)   . 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 Elbow injury   . Fibromyalgia   . Hypercholesterolemia    controlled with meds  . Hypertension    controlled with meds, checks regularly  . Liver cyst   . Osteopenia   . PPD positive   . Skin cancer    recent, resolved  . Sleep apnea     Past Surgical History:  Procedure Laterality Date  . ADENOIDECTOMY    . APPENDECTOMY    . BACK SURGERY    . BILATERAL OOPHORECTOMY Right   . BREAST BIOPSY Right    1988 negative  . BREAST BIOPSY Right 09/25/2015   stereo unknown  . CHOLECYSTECTOMY    . DILATION AND CURETTAGE OF UTERUS  1966  . ESOPHAGOGASTRODUODENOSCOPY (EGD) WITH PROPOFOL N/A 07/31/2015   Procedure: ESOPHAGOGASTRODUODENOSCOPY (EGD) WITH PROPOFOL;  Surgeon: [name], MD;  Location: [facility] ENDOSCOPY;  Service: Endoscopy;  Laterality: N/A;  . ESOPHAGOGASTRODUODENOSCOPY (EGD) WITH PROPOFOL N/A 05/06/2016   Procedure: ESOPHAGOGASTRODUODENOSCOPY (EGD) WITH PROPOFOL;  Surgeon: [name], MD;  Location: [facility] ENDOSCOPY;  Service:  Endoscopy;  Laterality: N/A;  . TONSILLECTOMY    . TOTAL KNEE ARTHROPLASTY Bilateral    2001,2006  . TUBAL LIGATION  1971    Vitals:   10/02/16 1441  BP: 138/80        Subjective Assessment - 10/02/16 1442    Subjective Pt reports she is doing well this date.  No falls and no new changes since last session.     Patient is accompained by: Family member   Pertinent History Factors Affecting Rehab: decreased mobility in her knee, multiple joints with limited ROM, chornic issues for ~2 years   Limitations Walking   How long can you sit comfortably? N/A   How long can you stand comfortably? 1-2 hours   How long can you walk comfortably? very limited, notes she can walk 20-30 minutes with AD until she is unsafe   Patient Stated Goals improve getting up/down from chairs; become more balanced   Currently in Pain? No/denies       TREATMENT   Therapeutic Exercise:  Sit<>stand from mat table without UE support. Pt with poor hip control and cues provided to gain balance once standing prior to sitting. Cues for eccentric control.  Seated LAQ with 5# ankle weight. 3x10 each LE  Pt ascended/descend 4 steps x2. Cues provided for exaggerated R hip flexion when ascending as pt demonstrates circumduction at hip. Alternating pattern with 1  rail assist for ascent. Pt attempted alternating pattern during descent but required 2 railing assist for alternating pattern when stepping down with L foot.  Forward lunges onto Bosu ball x10 each LE   Neuromuscular Re-Ed:  Rhomberg stance on airex 2x30 seconds and again 1x30 seconds with head turns  Marching on airex pad x20 each LE  Tandem stance on airex pad 1x30 seconds with each foot forward.  Alternating toe taps up to 8" step from airex 2x10.  Tandem walking on airex x8 lengths in airex                 PT Education - 10/02/16 1432    Education provided Yes   Education Details Exercise technique; balance exercises   Person(s) Educated  Patient   Methods Explanation;Tactile cues;Demonstration;Verbal cues   Comprehension Verbalized understanding;Returned demonstration;Need further instruction             PT Long Term Goals - 09/23/16 1449      PT LONG TERM GOAL #1   Title Patient will be independent in HEP in order to increase patient's ability to maintain gains achieved in therapy and assist with return to PLOF.    Time 4   Period Weeks   Status On-going     PT LONG TERM GOAL #2   Title Patient will be able to perform 5x sit to stand with BUE in a safe manner in under 1 minute in order to perform transfers at supervision level.   Time 4   Period Weeks   Status Achieved     PT LONG TERM GOAL #3   Title Patient will increase her 10 m walk test time to greater than or equal to 0.75 m/s in order to be a community ambulator at decreased risk for falls.    Time 4   Period Weeks   Status Achieved     PT LONG TERM GOAL #4   Title Patient will increase Berg balance score to >45/56 in order to decrease patient's risk for falls during functional activities.    Time 4   Period Weeks   Status Achieved     PT LONG TERM GOAL #5   Title Patient will be able to negotiate one 4" curb with min. UE support independently in a safe manner in order to allow for improved and safe community ambulation.    Time 4   Period Weeks   Status Achieved     PT LONG TERM GOAL #6   Title Patient will be independent in getting up/down from sofa/soft chair with 1 hand pushing up to improve mobility in home.   Time 4   Period Weeks   Status Partially Met     PT LONG TERM GOAL #7   Title Patient will be independent in descending stairs with 1 rail assist or less to improve safety in the community and at daughter's house.    Time 4   Period Weeks   Status Partially Met               Plan - 10/02/16 1503    Clinical Impression Statement Pt demonstrated impaired balance with higher level balance exercises.  She demonstrates  poor foot clearance during ascent of steps and dec strength R quad during descent of steps.  Focused today's exercises to target these deficits and pt tolerated all interventions well.  Pt will benefit from continued skilled PT interventions for improved gait/stair mechanics and balance and strength.   Rehab Potential  Fair   Clinical Impairments Affecting Rehab Potential Negative Factors: Chronicity of issue, frequent falls, multiple joints with limited motion; Positive Factors: supportive husband, motivated; Clinical Impression: Evolving-multiple falls and mutliple co-morbidities affecting rehab   PT Frequency 1x / week   PT Duration 4 weeks   PT Treatment/Interventions ADLs/Self Care Home Management;Aquatic Therapy;Biofeedback;Canalith Repostioning;Cryotherapy;Electrical Stimulation;Moist Heat;DME Instruction;Gait training;Stair training;Functional mobility training;Therapeutic activities;Therapeutic exercise;Balance training;Neuromuscular re-education;Patient/family education;Manual techniques;Passive range of motion;Energy conservation   PT Next Visit Plan LE strengthening, balance activities, glut med strengthening with lateral step ups   PT Home Exercise Plan HEP maintained-see patient instructions   Consulted and Agree with Plan of Care Patient;Family member/caregiver      Patient will benefit from skilled therapeutic intervention in order to improve the following deficits and impairments:  Abnormal gait, Decreased activity tolerance, Decreased balance, Decreased coordination, Decreased endurance, Decreased mobility, Decreased range of motion, Decreased safety awareness, Decreased strength, Hypomobility, Increased edema, Impaired flexibility, Impaired sensation, Impaired UE functional use, Improper body mechanics, Postural dysfunction, Pain, Obesity  Visit Diagnosis: Unsteadiness on feet  Muscle weakness (generalized)  Difficulty in walking, not elsewhere classified     Problem  List Patient Active Problem List   Diagnosis Date Noted  . Absolute anemia 06/26/2015  . A-fib ([place]) 06/26/2015  . Hepatic cyst 06/26/2015  . BP (high blood pressure) 06/26/2015  . HLD (hyperlipidemia) 06/26/2015  . H/O: osteoarthritis 06/26/2015  . Arthritis, degenerative 06/26/2015  . Chronic kidney disease (CKD), stage III (moderate) 06/26/2015  . Osteopenia 06/26/2015  . Obstructive apnea 04/19/2015    Collie Siad PT, DPT 10/02/2016, 3:21 PM  [place] MAIN [facility] SERVICES [address] [place], [place], [address] Phone: [phone]   Fax:  [phone]  Name: Jaclyn Galvan MRN: [id number] Date of Birth: May 07, 1941

## 2016-10-08 ENCOUNTER — Encounter: Payer: Medicare Other | Admitting: Physical Therapy

## 2016-10-08 ENCOUNTER — Ambulatory Visit
Admission: RE | Admit: 2016-10-08 | Discharge: 2016-10-08 | Disposition: A | Discharge status: Home / Self Care | Payer: Medicare Other | Source: Ambulatory Visit | Attending: Internal Medicine | Admitting: Internal Medicine

## 2016-10-08 DIAGNOSIS — Z1231 Encounter for screening mammogram for malignant neoplasm of breast: Secondary | ICD-10-CM | POA: Insufficient documentation

## 2016-10-14 ENCOUNTER — Ambulatory Visit: Payer: Medicare Other | Attending: Internal Medicine

## 2016-10-14 VITALS — BP 145/51 | HR 74

## 2016-10-14 DIAGNOSIS — R262 Difficulty in walking, not elsewhere classified: Secondary | ICD-10-CM | POA: Diagnosis present

## 2016-10-14 DIAGNOSIS — R2681 Unsteadiness on feet: Secondary | ICD-10-CM

## 2016-10-14 DIAGNOSIS — M6281 Muscle weakness (generalized): Secondary | ICD-10-CM

## 2016-10-14 NOTE — Therapy (Signed)
[place] MAIN [facility] SERVICES [address] [place], [place], [address] Phone: [phone]   Fax:  [phone]  Physical Therapy Treatment  Patient Details  Name: Jaclyn Galvan MRN: [id number] Date of Birth: 1941-07-19 Referring Provider: Dr. [name]  Encounter Date: 10/14/2016      PT End of Session - 10/14/16 1445    Visit Number 17   Number of Visits 21   Date for PT Re-Evaluation 10/21/16   Authorization Type GCodes 7/10   PT Start Time 1450   PT Stop Time 1540   PT Time Calculation (min) 50 min   Equipment Utilized During Treatment Gait belt   Activity Tolerance Patient tolerated treatment well   Behavior During Therapy [facility] for tasks assessed/performed      Past Medical History:  Diagnosis Date  . Anemia    resolved from the beginning of this year  . CKD (chronic kidney disease)   . 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 Elbow injury   . Fibromyalgia   . Hypercholesterolemia    controlled with meds  . Hypertension    controlled with meds, checks regularly  . Liver cyst   . Osteopenia   . PPD positive   . Skin cancer    recent, resolved  . Sleep apnea     Past Surgical History:  Procedure Laterality Date  . ADENOIDECTOMY    . APPENDECTOMY    . BACK SURGERY    . BILATERAL OOPHORECTOMY Right   . BREAST BIOPSY Right    1988 negative  . BREAST BIOPSY Right 09/25/2015   stereo unknown  . BREAST EXCISIONAL BIOPSY Left    ? when  . CHOLECYSTECTOMY    . DILATION AND CURETTAGE OF UTERUS  1966  . ESOPHAGOGASTRODUODENOSCOPY (EGD) WITH PROPOFOL N/A 07/31/2015   Procedure: ESOPHAGOGASTRODUODENOSCOPY (EGD) WITH PROPOFOL;  Surgeon: [name], MD;  Location: [facility] ENDOSCOPY;  Service: Endoscopy;  Laterality: N/A;  . ESOPHAGOGASTRODUODENOSCOPY (EGD) WITH PROPOFOL N/A 05/06/2016   Procedure: ESOPHAGOGASTRODUODENOSCOPY (EGD) WITH PROPOFOL;  Surgeon: [name], MD;  Location:  [facility] ENDOSCOPY;  Service: Endoscopy;  Laterality: N/A;  . TONSILLECTOMY    . TOTAL KNEE ARTHROPLASTY Bilateral    2001,2006  . TUBAL LIGATION  1971    Vitals:   10/14/16 1452  BP: (!) 145/51  Pulse: 74  SpO2: 96%        Subjective Assessment - 10/14/16 1445    Subjective Pt reports she is doing well on this date. She is performing HEP and denies pain currently. No specific questions or concerns currently.    Patient is accompained by: Family member   Pertinent History Factors Affecting Rehab: decreased mobility in her knee, multiple joints with limited ROM, chornic issues for ~2 years   Limitations Walking   How long can you sit comfortably? N/A   How long can you stand comfortably? 1-2 hours   How long can you walk comfortably? very limited, notes she can walk 20-30 minutes with AD until she is unsafe   Patient Stated Goals improve getting up/down from chairs; become more balanced   Currently in Pain? No/denies         TREATMENT    Therapeutic Exercise NuStep L3 x 3 minutes (unbilled); Sit<>stand from chair with Airex in seat without UE support 3 x 10, Cues for eccentric control; Seated manually resisted clams 3 x 10; Seated manually resisted hip adduction 3 x 10; Red  tband resisted standing hip abduction and extension 2 x 10 bilateral; Lateral step-ups 6" step 2 x 5 bilateral, unilateral to bilateral UE support; Forward lunges onto Bosu ball x10 each LE, no UE support;    Neuromuscular Re-education Airex balance with 1kg ball passes with therapist R and L varying height and direction to challenge trunk rotation and forward reach; Airex toe taps to 6" step without UE support Feet together Airex balance with eyes open 30 seconds x 2 , eyes closed 30 seconds (performed in 5-8 second bouts); Rocker board R/L orientation with static balance followed by horizontal head turns;                          PT Education - 10/14/16 1445    Education  provided Yes   Education Details HEP reinforced   Person(s) Educated Patient   Methods Explanation   Comprehension Verbalized understanding             PT Long Term Goals - 09/23/16 1449      PT LONG TERM GOAL #1   Title Patient will be independent in HEP in order to increase patient's ability to maintain gains achieved in therapy and assist with return to PLOF.    Time 4   Period Weeks   Status On-going     PT LONG TERM GOAL #2   Title Patient will be able to perform 5x sit to stand with BUE in a safe manner in under 1 minute in order to perform transfers at supervision level.   Time 4   Period Weeks   Status Achieved     PT LONG TERM GOAL #3   Title Patient will increase her 10 m walk test time to greater than or equal to 0.75 m/s in order to be a community ambulator at decreased risk for falls.    Time 4   Period Weeks   Status Achieved     PT LONG TERM GOAL #4   Title Patient will increase Berg balance score to >45/56 in order to decrease patient's risk for falls during functional activities.    Time 4   Period Weeks   Status Achieved     PT LONG TERM GOAL #5   Title Patient will be able to negotiate one 4" curb with min. UE support independently in a safe manner in order to allow for improved and safe community ambulation.    Time 4   Period Weeks   Status Achieved     PT LONG TERM GOAL #6   Title Patient will be independent in getting up/down from sofa/soft chair with 1 hand pushing up to improve mobility in home.   Time 4   Period Weeks   Status Partially Met     PT LONG TERM GOAL #7   Title Patient will be independent in descending stairs with 1 rail assist or less to improve safety in the community and at daughter's house.    Time 4   Period Weeks   Status Partially Met               Plan - 10/14/16 1446    Clinical Impression Statement Pt requires intermittent seated rest breaks during therapy session secondary to fatigue. She demonstrates  balance impairment with higher level balance exercises. During intake pt found to have a regularly irregular heart rythym with 4-5 beats followed by a pause. Rate in WNL and pt is asymmptomatic without heart  palpitations or chest pain. Cardiac RN consulted and encouraged pt to notify Dr. [name] at next appointment. If pt were to develop tachycardia, chest pain/pressure, or SOB call 911. Remote history of afib with cardioversion. Pt encouraged to continue HEP and folllow-up as scheduled.    Rehab Potential Fair   Clinical Impairments Affecting Rehab Potential Negative Factors: Chronicity of issue, frequent falls, multiple joints with limited motion; Positive Factors: supportive husband, motivated; Clinical Impression: Evolving-multiple falls and mutliple co-morbidities affecting rehab   PT Frequency 1x / week   PT Duration 4 weeks   PT Treatment/Interventions ADLs/Self Care Home Management;Aquatic Therapy;Biofeedback;Canalith Repostioning;Cryotherapy;Electrical Stimulation;Moist Heat;DME Instruction;Gait training;Stair training;Functional mobility training;Therapeutic activities;Therapeutic exercise;Balance training;Neuromuscular re-education;Patient/family education;Manual techniques;Passive range of motion;Energy conservation   PT Next Visit Plan LE strengthening, balance activities, glut med strengthening with lateral step ups   PT Home Exercise Plan HEP maintained-see patient instructions   Consulted and Agree with Plan of Care Patient;Family member/caregiver      Patient will benefit from skilled therapeutic intervention in order to improve the following deficits and impairments:  Abnormal gait, Decreased activity tolerance, Decreased balance, Decreased coordination, Decreased endurance, Decreased mobility, Decreased range of motion, Decreased safety awareness, Decreased strength, Hypomobility, Increased edema, Impaired flexibility, Impaired sensation, Impaired UE functional use, Improper body  mechanics, Postural dysfunction, Pain, Obesity  Visit Diagnosis: Unsteadiness on feet  Muscle weakness (generalized)  Difficulty in walking, not elsewhere classified     Problem List Patient Active Problem List   Diagnosis Date Noted  . Absolute anemia 06/26/2015  . A-fib ([place]) 06/26/2015  . Hepatic cyst 06/26/2015  . BP (high blood pressure) 06/26/2015  . HLD (hyperlipidemia) 06/26/2015  . H/O: osteoarthritis 06/26/2015  . Arthritis, degenerative 06/26/2015  . Chronic kidney disease (CKD), stage III (moderate) 06/26/2015  . Osteopenia 06/26/2015  . Obstructive apnea 04/19/2015   Phillips Grout PT, DPT   Huprich,Jason 10/14/2016, 3:54 PM  [place] MAIN [facility] SERVICES [address] [place], [place], [address] Phone: [phone]   Fax:  [phone]  Name: Jaclyn Galvan MRN: [id number] Date of Birth: [birth year]

## 2016-10-15 ENCOUNTER — Ambulatory Visit: Payer: Medicare Other | Admitting: Physical Therapy

## 2016-10-23 ENCOUNTER — Ambulatory Visit: Payer: Medicare Other | Admitting: Physical Therapy

## 2016-10-29 ENCOUNTER — Ambulatory Visit: Payer: Medicare Other | Admitting: Physical Therapy

## 2016-10-29 ENCOUNTER — Encounter: Payer: Self-pay | Admitting: Physical Therapy

## 2016-10-29 VITALS — BP 132/68

## 2016-10-29 DIAGNOSIS — R262 Difficulty in walking, not elsewhere classified: Secondary | ICD-10-CM

## 2016-10-29 DIAGNOSIS — M6281 Muscle weakness (generalized): Secondary | ICD-10-CM

## 2016-10-29 DIAGNOSIS — R2681 Unsteadiness on feet: Secondary | ICD-10-CM

## 2016-10-29 NOTE — Therapy (Signed)
[place] MAIN [facility] SERVICES [address] [place], [place], [address] Phone: [phone]   Fax:  [phone]  Physical Therapy Treatment/Discharge Summary  Patient Details  Name: Jaclyn Galvan MRN: [id number] Date of Birth: Jun 24, 1941 Referring Provider: Dr. [name]  Encounter Date: 10/29/2016      PT End of Session - 10/29/16 1312    Visit Number 18   Number of Visits 21   Date for PT Re-Evaluation 11/05/16   Authorization Type GCodes 8/10   PT Start Time 1300   PT Stop Time 1335   PT Time Calculation (min) 35 min   Equipment Utilized During Treatment Gait belt   Activity Tolerance Patient tolerated treatment well;No increased pain   Behavior During Therapy WFL for tasks assessed/performed      Past Medical History:  Diagnosis Date  . Anemia    resolved from the beginning of this year  . CKD (chronic kidney disease)   . 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 Elbow injury   . Fibromyalgia   . Hypercholesterolemia    controlled with meds  . Hypertension    controlled with meds, checks regularly  . Liver cyst   . Osteopenia   . PPD positive   . Skin cancer    recent, resolved  . Sleep apnea     Past Surgical History:  Procedure Laterality Date  . ADENOIDECTOMY    . APPENDECTOMY    . BACK SURGERY    . BILATERAL OOPHORECTOMY Right   . BREAST BIOPSY Right    1988 negative  . BREAST BIOPSY Right 09/25/2015   stereo unknown  . BREAST EXCISIONAL BIOPSY Left    ? when  . CHOLECYSTECTOMY    . DILATION AND CURETTAGE OF UTERUS  1966  . ESOPHAGOGASTRODUODENOSCOPY (EGD) WITH PROPOFOL N/A 07/31/2015   Procedure: ESOPHAGOGASTRODUODENOSCOPY (EGD) WITH PROPOFOL;  Surgeon: [name], MD;  Location: [facility] ENDOSCOPY;  Service: Endoscopy;  Laterality: N/A;  . ESOPHAGOGASTRODUODENOSCOPY (EGD) WITH PROPOFOL N/A 05/06/2016   Procedure: ESOPHAGOGASTRODUODENOSCOPY (EGD) WITH PROPOFOL;  Surgeon:  [name], MD;  Location: [facility] ENDOSCOPY;  Service: Endoscopy;  Laterality: N/A;  . TONSILLECTOMY    . TOTAL KNEE ARTHROPLASTY Bilateral    2001,2006  . TUBAL LIGATION  1971    Vitals:   10/29/16 1311  BP: 132/68        Subjective Assessment - 10/29/16 1310    Subjective Patient reports doing well; She reports 1 new fall since last session; She reports that she tripped over her husbands feet; She reports needing help to get up; She reports having a bruise on her back but otherwise no injury; Patient reports that her doctor added a new BP medicine;    Patient is accompained by: Family member   Pertinent History Factors Affecting Rehab: decreased mobility in her knee, multiple joints with limited ROM, chornic issues for ~2 years   Limitations Walking   How long can you sit comfortably? N/A   How long can you stand comfortably? 1-2 hours   How long can you walk comfortably? very limited, notes she can walk 20-30 minutes with AD until she is unsafe   Patient Stated Goals improve getting up/down from chairs; become more balanced   Currently in Pain? No/denies        Treatment: BP at start of session132/68 in LUE Warm up on Nustep BUE/BLE level 2 x5 min (unbilled);   Instructed patient in  outcome measures, see below:  Patient required min Vcs for correct exercise technique; She required cues to increase forward lean with sit<>stand transfers for better transfer ability;      [facility] PT Assessment - 10/29/16 0001      Standardized Balance Assessment   Five times sit to stand comments  22 sec with 1 HHA (>15 sec indicates high fall risk) more impaired since last reassessment on 12/91/17 which was 18 sec with 1 HHA   10 Meter Walk 0.89 m/s with rollator; (limited community ambulator, no change from lsat reassessment on 09/23/16 which was 0.86 m/s)     Berg Balance Test   Sit to Stand Able to stand  independently using hands   Standing Unsupported Able to stand  safely 2 minutes   Sitting with Back Unsupported but Feet Supported on Floor or Stool Able to sit safely and securely 2 minutes   Stand to Sit Controls descent by using hands   Transfers Able to transfer safely, definite need of hands   Standing Unsupported with Eyes Closed Able to stand 10 seconds with supervision   Standing Ubsupported with Feet Together Able to place feet together independently and stand for 1 minute with supervision   From Standing, Reach Forward with Outstretched Arm Can reach confidently >25 cm (10")   From Standing Position, Pick up Object from [place] to pick up shoe safely and easily   From Standing Position, Turn to Look Behind Over each Shoulder Looks behind from both sides and weight shifts well   Turn 360 Degrees Able to turn 360 degrees safely but slowly   Standing Unsupported, Alternately Place Feet on Step/Stool Able to stand independently and safely and complete 8 steps in 20 seconds   Standing Unsupported, One Foot in Front Able to take small step independently and hold 30 seconds   Standing on One Leg Unable to try or needs assist to prevent fall   Total Score 43   Berg comment: 80% risk for falls; more impaired from last reassessment on 08/26/16 which was 46/56      Patient demonstrates a plateau in progress. She is ambulating at the same speed as 2 months ago. She also continues to have trouble with sit<>stand transfers from low seats. She does report that toilet transfers are significantly better.   Patient admits that she is not always compliant with HEP; PT recommends that patient transition to fitness center class exercise to continue to pursue fitness goals and improve compliance;                        PT Education - 10/29/16 1311    Education provided Yes   Education Details HEP reinforced, progress towards goals, recommendations;    Person(s) Educated Patient   Methods Explanation;Verbal cues   Comprehension Verbalized  understanding;Returned demonstration;Verbal cues required             PT Long Term Goals - 10/29/16 1327      PT LONG TERM GOAL #1   Title Patient will be independent in HEP in order to increase patient's ability to maintain gains achieved in therapy and assist with return to PLOF.    Time 1   Period Weeks   Status On-going     PT LONG TERM GOAL #2   Title Patient will be able to perform 5x sit to stand with BUE in a safe manner in under 1 minute in order to perform transfers at supervision  level.   Time 1   Period Weeks   Status Achieved     PT LONG TERM GOAL #3   Title Patient will increase her 10 m walk test time to greater than or equal to 0.75 m/s in order to be a community ambulator at decreased risk for falls.    Time 1   Period Weeks   Status Achieved     PT LONG TERM GOAL #4   Title Patient will increase Berg balance score to >45/56 in order to decrease patient's risk for falls during functional activities.    Time 1   Period Weeks   Status Partially Met     PT LONG TERM GOAL #5   Title Patient will be able to negotiate one 4" curb with min. UE support independently in a safe manner in order to allow for improved and safe community ambulation.    Time 1   Period Weeks   Status Achieved     PT LONG TERM GOAL #6   Title Patient will be independent in getting up/down from sofa/soft chair with 1 hand pushing up to improve mobility in home.   Time 1   Period Weeks   Status Partially Met     PT LONG TERM GOAL #7   Title Patient will be independent in descending stairs with 1 rail assist or less to improve safety in the community and at daughter's house.    Time 1   Period Weeks   Status Partially Met               Plan - 10/29/16 1351    Clinical Impression Statement Patient instructed in outcome measures to assess progress towards goals; she demonstrates increased difficulty with sit<>Stand transfers; Patient required mod Vcs to increase forward  lean, increase scoot forward for better clearance; Patient requires at least 1 HHA to push up from chair; She reports doing a few exercises at home but states that she hasn't been very compliant. PT educated patient on progress towards goals and recommendations. Patient agreeable to pursue a regular exercise class to continue strengthening and balance as she would be more compliant with class rather than home exercise. Will discharge patient today and transition to fitness center.    Rehab Potential Fair   Clinical Impairments Affecting Rehab Potential Negative Factors: Chronicity of issue, frequent falls, multiple joints with limited motion; Positive Factors: supportive husband, motivated; Clinical Impression: Evolving-multiple falls and mutliple co-morbidities affecting rehab   PT Frequency 1x / week   PT Duration Other (comment)  1 week   PT Treatment/Interventions ADLs/Self Care Home Management;Aquatic Therapy;Biofeedback;Canalith Repostioning;Cryotherapy;Electrical Stimulation;Moist Heat;DME Instruction;Gait training;Stair training;Functional mobility training;Therapeutic activities;Therapeutic exercise;Balance training;Neuromuscular re-education;Patient/family education;Manual techniques;Passive range of motion;Energy conservation   PT Next Visit Plan LE strengthening, balance activities, glut med strengthening with lateral step ups   PT Home Exercise Plan HEP maintained-see patient instructions   Consulted and Agree with Plan of Care Patient;Family member/caregiver      Patient will benefit from skilled therapeutic intervention in order to improve the following deficits and impairments:  Abnormal gait, Decreased activity tolerance, Decreased balance, Decreased coordination, Decreased endurance, Decreased mobility, Decreased range of motion, Decreased safety awareness, Decreased strength, Hypomobility, Increased edema, Impaired flexibility, Impaired sensation, Impaired UE functional use, Improper  body mechanics, Postural dysfunction, Pain, Obesity  Visit Diagnosis: Unsteadiness on feet - Plan: PT plan of care cert/re-cert  Muscle weakness (generalized) - Plan: PT plan of care cert/re-cert  Difficulty in walking, not elsewhere classified -  Plan: PT plan of care cert/re-cert       G-Codes - 06-Nov-2016 1354    Functional Assessment Tool Used sit<>stand, 10MWT, Clinical Judgement   Functional Limitation Mobility: Walking and moving around   Mobility: Walking and Moving Around Goal Status [phone]) At least 1 percent but less than 20 percent impaired, limited or restricted   Mobility: Walking and Moving Around Discharge Status [phone]) At least 20 percent but less than 40 percent impaired, limited or restricted      Problem List Patient Active Problem List   Diagnosis Date Noted  . Absolute anemia 06/26/2015  . A-fib ([place]) 06/26/2015  . Hepatic cyst 06/26/2015  . BP (high blood pressure) 06/26/2015  . HLD (hyperlipidemia) 06/26/2015  . H/O: osteoarthritis 06/26/2015  . Arthritis, degenerative 06/26/2015  . Chronic kidney disease (CKD), stage III (moderate) 06/26/2015  . Osteopenia 06/26/2015  . Obstructive apnea 04/19/2015     Deakin Lacek PT, DPT 11/06/16, 2:44 PM   [place] MAIN [facility] SERVICES [address] [place], [place], [address] Phone: [phone]   Fax:  [phone]  Name: Jaclyn Galvan MRN: [id number] Date of Birth: [birth year]

## 2016-11-11 ENCOUNTER — Other Ambulatory Visit: Payer: Self-pay | Admitting: Internal Medicine

## 2016-11-11 DIAGNOSIS — R55 Syncope and collapse: Secondary | ICD-10-CM

## 2016-11-14 ENCOUNTER — Ambulatory Visit
Admission: RE | Admit: 2016-11-14 | Discharge: 2016-11-14 | Disposition: A | Discharge status: Home / Self Care | Payer: Medicare Other | Source: Ambulatory Visit | Attending: Internal Medicine | Admitting: Internal Medicine

## 2016-11-14 DIAGNOSIS — I6523 Occlusion and stenosis of bilateral carotid arteries: Secondary | ICD-10-CM | POA: Insufficient documentation

## 2016-11-14 DIAGNOSIS — R55 Syncope and collapse: Secondary | ICD-10-CM

## 2016-11-24 ENCOUNTER — Ambulatory Visit
Admission: RE | Admit: 2016-11-24 | Discharge: 2016-11-24 | Disposition: A | Discharge status: Home / Self Care | Payer: Medicare Other | Source: Ambulatory Visit | Attending: Internal Medicine | Admitting: Internal Medicine

## 2016-11-24 DIAGNOSIS — R55 Syncope and collapse: Secondary | ICD-10-CM | POA: Diagnosis present

## 2016-11-24 DIAGNOSIS — G319 Degenerative disease of nervous system, unspecified: Secondary | ICD-10-CM | POA: Insufficient documentation

## 2016-11-24 DIAGNOSIS — R9089 Other abnormal findings on diagnostic imaging of central nervous system: Secondary | ICD-10-CM | POA: Diagnosis not present

## 2016-11-24 DIAGNOSIS — Z8673 Personal history of transient ischemic attack (TIA), and cerebral infarction without residual deficits: Secondary | ICD-10-CM | POA: Diagnosis not present

## 2016-11-28 ENCOUNTER — Other Ambulatory Visit: Payer: Self-pay | Admitting: Nurse Practitioner

## 2016-11-28 DIAGNOSIS — R413 Other amnesia: Secondary | ICD-10-CM

## 2016-12-10 ENCOUNTER — Ambulatory Visit: Payer: Medicare Other

## 2016-12-11 ENCOUNTER — Ambulatory Visit: Payer: Medicare Other

## 2016-12-23 ENCOUNTER — Ambulatory Visit
Admission: RE | Admit: 2016-12-23 | Discharge: 2016-12-23 | Disposition: A | Discharge status: Home / Self Care | Payer: Medicare Other | Source: Ambulatory Visit | Attending: Nurse Practitioner | Admitting: Nurse Practitioner

## 2016-12-23 DIAGNOSIS — G319 Degenerative disease of nervous system, unspecified: Secondary | ICD-10-CM | POA: Insufficient documentation

## 2016-12-23 DIAGNOSIS — R413 Other amnesia: Secondary | ICD-10-CM

## 2016-12-23 DIAGNOSIS — I6782 Cerebral ischemia: Secondary | ICD-10-CM | POA: Insufficient documentation

## 2017-10-29 ENCOUNTER — Encounter: Payer: Self-pay | Admitting: *Deleted

## 2017-10-29 IMAGING — MG MM DIGITAL DIAGNOSTIC UNILAT*R*
5 series · 5 of 5 positions shown · non-contrast
Comparison: Previous exam(s).

CLINICAL DATA: Recall from screening mammogram.

EXAM:
DIGITAL DIAGNOSTIC RIGHT MAMMOGRAM

[R CC (1 of 2)]
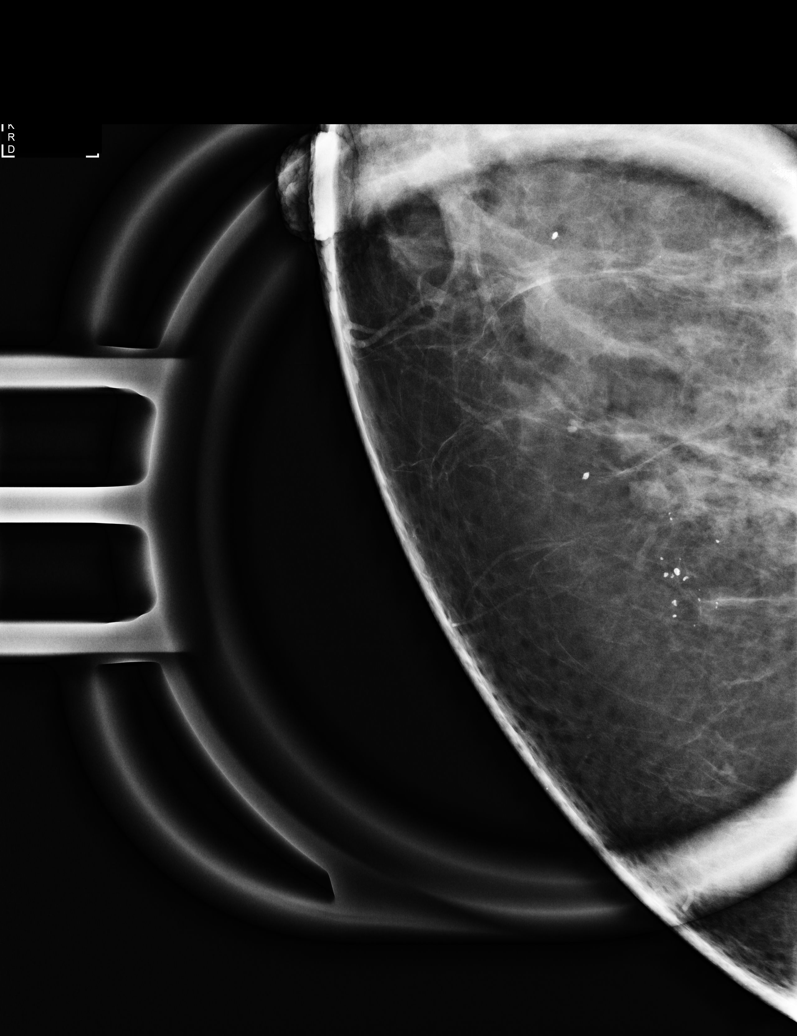

[R ML (1 of 3)]
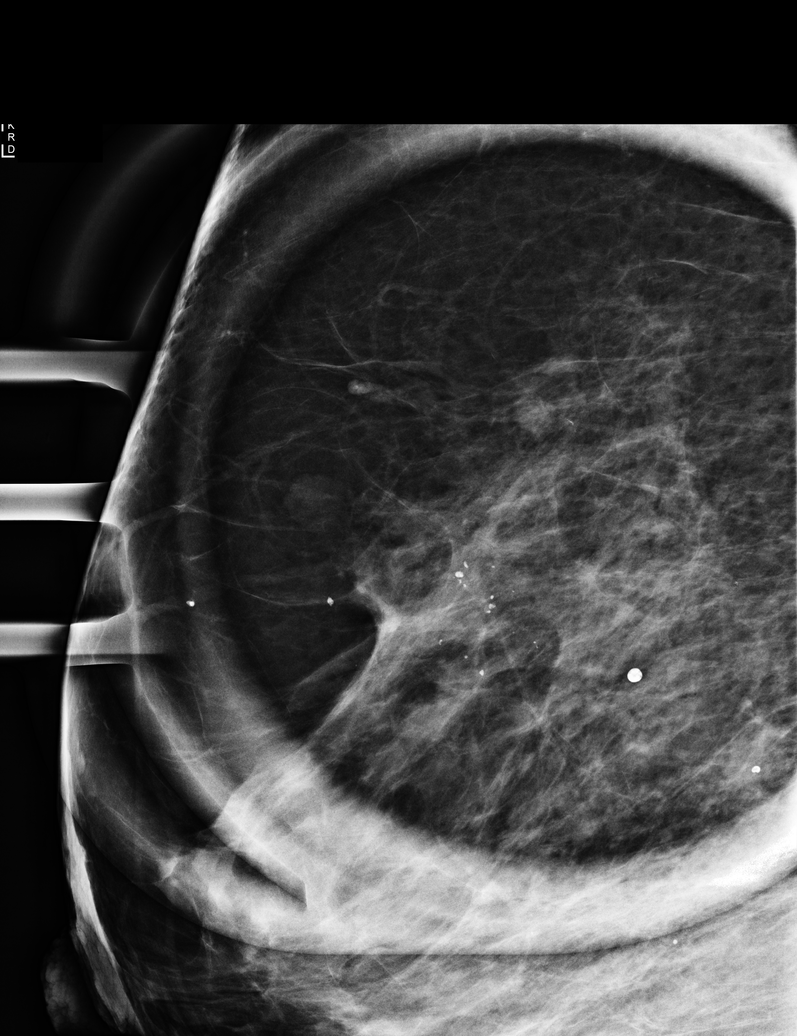

[R CC (2 of 2)]
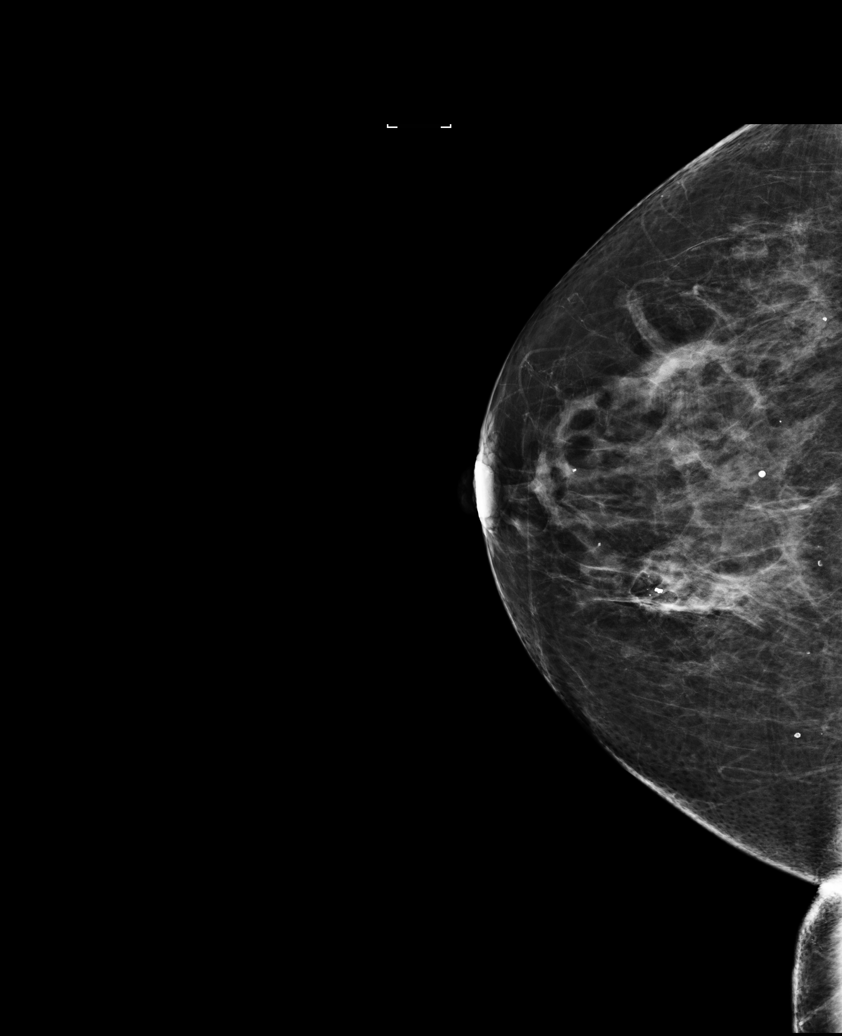

[R ML (2 of 3)]
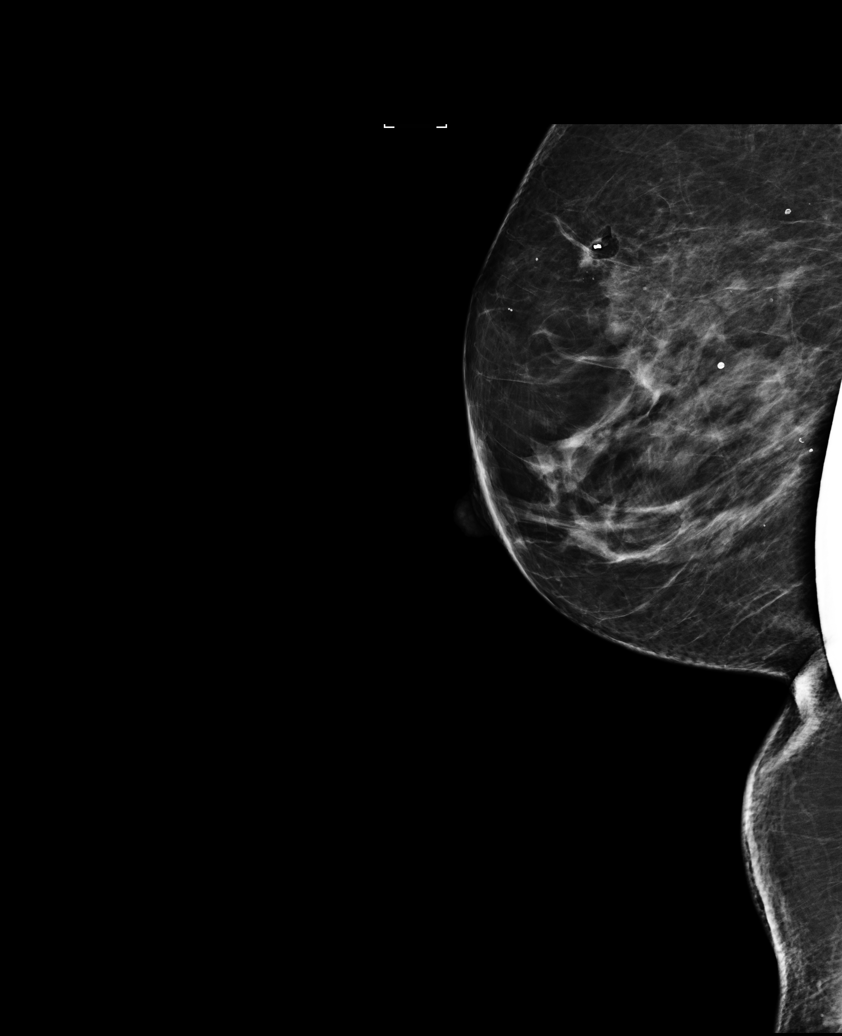

[R ML (3 of 3)]
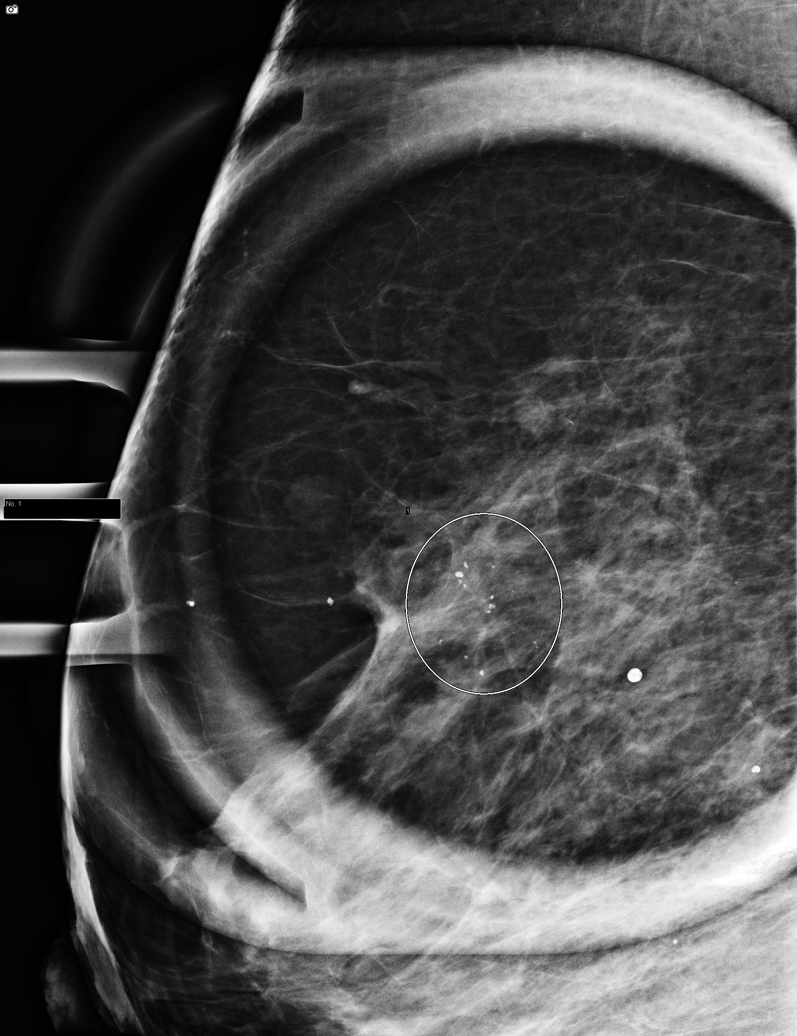

[5 of 5 positions shown; findings below may reference images not displayed]

ACR Breast Density Category b: There are scattered areas of
fibroglandular density.
FINDINGS: There is a group of relatively coarse heterogeneous calcifications
located within the upper inner quadrant of the right breast which
span 1.5 cm. Tissue sampling is recommended. I have discussed
stereotactic core biopsy of these calcifications with the patient
and her husband. This will be scheduled. The patient is currently on
daily aspirin. She has been asked to discontinue this for 5 days
prior to the biopsy.
IMPRESSION: 1.5 cm group of heterogeneous calcifications within the upper inner
quadrant of the right breast. Tissue sampling is recommended and
stereotactic core biopsy will be scheduled.

RECOMMENDATION:
Right breast stereotactic biopsy.

I have discussed the findings and recommendations with the patient.
Results were also provided in writing at the conclusion of the
visit. If applicable, a reminder letter will be sent to the patient
regarding the next appointment.

BI-RADS CATEGORY  4: Suspicious.

## 2017-10-30 ENCOUNTER — Encounter
Admission: RE | Disposition: A | Discharge status: Home / Self Care | Payer: Self-pay | Source: Ambulatory Visit | Attending: Gastroenterology

## 2017-10-30 ENCOUNTER — Ambulatory Visit: Payer: Medicare Other | Admitting: Anesthesiology

## 2017-10-30 ENCOUNTER — Encounter: Payer: Self-pay | Admitting: *Deleted

## 2017-10-30 ENCOUNTER — Ambulatory Visit
Admission: RE | Admit: 2017-10-30 | Discharge: 2017-10-30 | Disposition: A | Discharge status: Home / Self Care | Payer: Medicare Other | Source: Ambulatory Visit | Attending: Gastroenterology | Admitting: Gastroenterology

## 2017-10-30 DIAGNOSIS — K210 Gastro-esophageal reflux disease with esophagitis: Secondary | ICD-10-CM | POA: Insufficient documentation

## 2017-10-30 DIAGNOSIS — Z85828 Personal history of other malignant neoplasm of skin: Secondary | ICD-10-CM | POA: Insufficient documentation

## 2017-10-30 DIAGNOSIS — Z8673 Personal history of transient ischemic attack (TIA), and cerebral infarction without residual deficits: Secondary | ICD-10-CM | POA: Insufficient documentation

## 2017-10-30 DIAGNOSIS — E7800 Pure hypercholesterolemia, unspecified: Secondary | ICD-10-CM | POA: Diagnosis not present

## 2017-10-30 DIAGNOSIS — J449 Chronic obstructive pulmonary disease, unspecified: Secondary | ICD-10-CM | POA: Insufficient documentation

## 2017-10-30 DIAGNOSIS — Z09 Encounter for follow-up examination after completed treatment for conditions other than malignant neoplasm: Secondary | ICD-10-CM | POA: Insufficient documentation

## 2017-10-30 DIAGNOSIS — Z9989 Dependence on other enabling machines and devices: Secondary | ICD-10-CM | POA: Diagnosis not present

## 2017-10-30 DIAGNOSIS — Z79899 Other long term (current) drug therapy: Secondary | ICD-10-CM | POA: Insufficient documentation

## 2017-10-30 DIAGNOSIS — D132 Benign neoplasm of duodenum: Secondary | ICD-10-CM | POA: Insufficient documentation

## 2017-10-30 DIAGNOSIS — Z86018 Personal history of other benign neoplasm: Secondary | ICD-10-CM | POA: Insufficient documentation

## 2017-10-30 DIAGNOSIS — G4730 Sleep apnea, unspecified: Secondary | ICD-10-CM | POA: Diagnosis not present

## 2017-10-30 DIAGNOSIS — K449 Diaphragmatic hernia without obstruction or gangrene: Secondary | ICD-10-CM | POA: Diagnosis not present

## 2017-10-30 DIAGNOSIS — N183 Chronic kidney disease, stage 3 (moderate): Secondary | ICD-10-CM | POA: Diagnosis not present

## 2017-10-30 DIAGNOSIS — Z96659 Presence of unspecified artificial knee joint: Secondary | ICD-10-CM | POA: Insufficient documentation

## 2017-10-30 DIAGNOSIS — Z7982 Long term (current) use of aspirin: Secondary | ICD-10-CM | POA: Diagnosis not present

## 2017-10-30 DIAGNOSIS — I129 Hypertensive chronic kidney disease with stage 1 through stage 4 chronic kidney disease, or unspecified chronic kidney disease: Secondary | ICD-10-CM | POA: Insufficient documentation

## 2017-10-30 HISTORY — DX: Diaphragmatic hernia without obstruction or gangrene: K44.9

## 2017-10-30 HISTORY — PX: ESOPHAGOGASTRODUODENOSCOPY (EGD) WITH PROPOFOL: SHX5813

## 2017-10-30 HISTORY — DX: Unspecified chronic gastritis without bleeding: K29.50

## 2017-10-30 HISTORY — DX: Unspecified atrial fibrillation: I48.91

## 2017-10-30 HISTORY — DX: Diverticulosis of intestine, part unspecified, without perforation or abscess without bleeding: K57.90

## 2017-10-30 HISTORY — DX: Calculus of gallbladder without cholecystitis without obstruction: K80.20

## 2017-10-30 SURGERY — ESOPHAGOGASTRODUODENOSCOPY (EGD) WITH PROPOFOL
Anesthesia: General

## 2017-10-30 MED ORDER — PROPOFOL 500 MG/50ML IV EMUL
INTRAVENOUS | Status: AC
  Filled 2017-10-30: qty 50

## 2017-10-30 MED ORDER — PROPOFOL 10 MG/ML IV BOLUS
INTRAVENOUS | Status: AC
  Filled 2017-10-30: qty 20

## 2017-10-30 MED ORDER — PROPOFOL 500 MG/50ML IV EMUL
INTRAVENOUS | Status: DC | PRN
  Administered 2017-10-30: 150 ug/kg/min via INTRAVENOUS

## 2017-10-30 MED ORDER — LIDOCAINE HCL (PF) 2 % IJ SOLN
INTRAMUSCULAR | Status: DC | PRN
  Administered 2017-10-30: 100 mg via INTRADERMAL

## 2017-10-30 MED ORDER — SODIUM CHLORIDE 0.9 % IV SOLN
INTRAVENOUS | Status: DC
  Administered 2017-10-30: 1000 mL via INTRAVENOUS
  Administered 2017-10-30: 09:00:00 via INTRAVENOUS

## 2017-10-30 MED ORDER — SODIUM CHLORIDE 0.9 % IV SOLN: INTRAVENOUS | Status: DC

## 2017-10-30 MED ORDER — PROPOFOL 10 MG/ML IV BOLUS
INTRAVENOUS | Status: DC | PRN
  Administered 2017-10-30: 70 mg via INTRAVENOUS

## 2017-10-30 NOTE — Transfer of Care (Signed)
Immediate Anesthesia Transfer of Care Note  Patient: Jaclyn Galvan  Procedure(s) Performed: ESOPHAGOGASTRODUODENOSCOPY (EGD) WITH PROPOFOL (N/A )  Patient Location: PACU  Anesthesia Type:General  Level of Consciousness: awake and sedated  Airway & Oxygen Therapy: Patient Spontanous Breathing and Patient connected to nasal cannula oxygen  Post-op Assessment: Report given to RN and Post -op Vital signs reviewed and stable  Post vital signs: Reviewed and stable  Last Vitals:  Vitals:   10/30/17 0833 10/30/17 0920  BP: (!) 157/73 136/81  Pulse: 84 74  Resp: 18 20  Temp: (!) 36.4 C (!) 36.3 C  SpO2: 98% 98%    Last Pain:  Vitals:   10/30/17 0920  TempSrc: Tympanic         Complications: No apparent anesthesia complications

## 2017-10-30 NOTE — Anesthesia Postprocedure Evaluation (Signed)
Anesthesia Post Note  Patient: Jaclyn Galvan  Procedure(s) Performed: ESOPHAGOGASTRODUODENOSCOPY (EGD) WITH PROPOFOL (N/A )  Patient location during evaluation: Endoscopy Anesthesia Type: General Level of consciousness: awake and alert Pain management: pain level controlled Vital Signs Assessment: post-procedure vital signs reviewed and stable Respiratory status: spontaneous breathing and respiratory function stable Cardiovascular status: stable Anesthetic complications: no     Last Vitals:  Vitals:   10/30/17 0920 10/30/17 0930  BP: 136/81 (!) 144/82  Pulse: 74 72  Resp: 20 19  Temp: (!) 36.3 C   SpO2: 98% 98%    Last Pain:  Vitals:   10/30/17 0920  TempSrc: Tympanic                 KEPHART,WILLIAM K

## 2017-10-30 NOTE — Anesthesia Procedure Notes (Signed)
Date/Time: 10/30/2017 9:14 AM Performed by: Nelda Marseille, CRNA Pre-anesthesia Checklist: Patient identified, Emergency Drugs available, Suction available, Patient being monitored and Timeout performed Oxygen Delivery Method: Nasal cannula

## 2017-10-30 NOTE — Anesthesia Post-op Follow-up Note (Signed)
Anesthesia QCDR form completed.        

## 2017-10-30 NOTE — H&P (Signed)
Outpatient short stay form Pre-procedure 10/30/2017 8:55 AM [name] MD  Primary Physician: Dr. Ramonita Lab  Reason for visit:  EGD   History of present illness:  Patient is a 77 year old female presenting today as above. 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 She does take a daily 81 mg aspirin. She takes aspirin free Excedrin.    Current Facility-Administered Medications:  .  0.9 %  sodium chloride infusion, , Intravenous, Continuous, [name], MD, Last Rate: 20 mL/hr at 10/30/17 0851, 1,000 mL at 10/30/17 0851 .  0.9 %  sodium chloride infusion, , Intravenous, Continuous, [name], MD  Medications Prior to Admission  Medication Sig Dispense Refill Last Dose  . donepezil (ARICEPT) 5 MG tablet Take 5 mg by mouth at bedtime.     . Multiple Vitamin (MULTIVITAMIN) tablet Take 1 tablet by mouth daily.     . Nutritional Supplements (ESTROVEN PMS PO) Take by mouth.     . AMLODIPINE-ATORVASTATIN PO Take by mouth.   Taking  . aspirin EC 81 MG tablet Take 81 mg by mouth daily.   Taking  . aspirin-acetaminophen-caffeine (EXCEDRIN MIGRAINE) 250-250-65 MG per tablet Take by mouth every 6 (six) hours as needed for headache.   Taking  . atorvastatin (LIPITOR) 10 MG tablet Take 5 mg by mouth daily.    Taking  . Black Cohosh-SoyIsoflav-Magnol (ESTROVEN MENOPAUSE RELIEF) CAPS Take by mouth at bedtime.    Taking  . calcium carbonate 1250 MG capsule Take 1,250 mg by mouth 2 (two) times daily with a meal.   Taking  . cholecalciferol (VITAMIN D) 400 UNITS TABS tablet Take 400 Units by mouth.   Taking  . co-enzyme Q-10 30 MG capsule Take 30 mg by mouth daily.    Taking  . DULoxetine (CYMBALTA) 20 MG capsule Take 30 mg by mouth daily.    Taking  . losartan (COZAAR) 25 MG tablet Take 50 mg by mouth daily.    Taking  . pantoprazole (PROTONIX) 40 MG tablet Take 40 mg by mouth 2 (two) times daily.    Taking  . UNABLE TO FIND    Taking  . vitamin B-12 (CYANOCOBALAMIN) 100 MCG tablet Take 100 mcg by mouth daily.   Taking  . [DISCONTINUED] fluticasone (FLONASE) 50 MCG/ACT nasal spray Place into both nostrils as needed for allergies or rhinitis.   Taking  . [DISCONTINUED] Omega-3 Fatty Acids (FISH OIL) 1000 MG CAPS Take by mouth.   Taking  . [DISCONTINUED] pantoprazole (PROTONIX) 40 MG tablet Take by mouth.   05/05/2016 at Unknown time     Allergies  Allergen Reactions  . Ciprofloxacin   . Morphine And Related      Past Medical History:  Diagnosis Date  . Anemia    resolved from the beginning of this year  . Atrial fibrillation [facility])    cardioversion March 2004  . Cholelithiasis   . Chronic gastritis   . CKD (chronic kidney disease)    chronic kidney disease stage III  . Diverticulosis   . 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 DJD (degenerative joint disease)   . Elbow injury   . Fibromyalgia   . HH (hiatus hernia)   . Hypercholesterolemia    controlled with meds  . Hypertension    controlled with meds, checks regularly  . Liver cyst   . Osteopenia   .  PPD positive   . Skin cancer    recent, resolved  . Sleep apnea     Review of systems:      Physical Exam    Heart and lungs: Regular rate and rhythm without rub or gallop, lungs are bilaterally clear.    HEENT: Normocephalic atraumatic eyes are anicteric    Other:     Pertinant exam for procedure: Soft nontender nondistended bowel sounds positive normoactive.    Planned proceedures: EGD and indicated procedures. I have discussed the risks benefits and complications of procedures to include not limited to bleeding, infection, perforation and the risk of sedation and the patient wishes to proceed.    [name], MD Gastroenterology 10/30/2017  8:55 AM

## 2017-10-30 NOTE — Op Note (Signed)
[facility] Gastroenterology Patient Name: Jaclyn Galvan Procedure Date: 10/30/2017 8:54 AM MRN: [id number] Account #: [id number] Date of Birth: Mar 27, 1941 Admit Type: Outpatient Age: 77 Room: [facility] ENDO ROOM 1 Gender: Female Note Status: Finalized Procedure:            Upper GI endoscopy Indications:          Surveillance procedure, Follow-up of Barrett's                        esophagus, follow up duodenal adenoma. Providers:            [name], MD Referring MD:         Ramonita Lab, MD (Referring MD) Medicines:            Monitored Anesthesia Care Complications:        No immediate complications. Procedure:            Pre-Anesthesia Assessment:                       - ASA Grade Assessment: III - A patient with severe                        systemic disease.                       After obtaining informed consent, the endoscope was                        passed under direct vision. Throughout the procedure,                        the patient's blood pressure, pulse, and oxygen                        saturations were monitored continuously. The Endoscope                        was introduced through the mouth, and advanced to the                        third part of duodenum. The upper GI endoscopy was                        accomplished without difficulty. The patient tolerated                        the procedure well. Findings:      The Z-line was irregular.      There were esophageal mucosal changes suggestive of short-segment       Barrett's esophagus present in the distal esophagus. The maximum       longitudinal extent of these mucosal changes was 1 cm in length. Mucosa       was biopsied with a cold forceps for histology in 4 quadrants. One       specimen bottle was sent to pathology.      Abnormal motility was noted in the middle third of the esophagus and in       the lower third of the esophagus. The cricopharyngeus was normal. There       are  extra peristaltic waves in the esophageal body. The distal  esophagus/lower esophageal sphincter is open. Tertiary peristaltic waves       are noted.      Localized granular mucosa was found in the gastric antrum. Biopsies were       taken with a cold forceps for histology.      A medium-sized hiatal hernia was found. The Z-line was a variable       distance from incisors; the hiatal hernia was sliding.      A single 2 mm sessile polyp with no bleeding was found in the third       portion of the duodenum. The polyp was removed with a cold biopsy       forceps. Resection and retrieval were complete.      The exam of the duodenum was otherwise normal. Impression:           - Z-line irregular.                       - Esophageal mucosal changes suggestive of                        short-segment Barrett's esophagus. Biopsied.                       - Abnormal esophageal motility.                       - Granular gastric mucosa. Biopsied.                       - Medium-sized hiatal hernia.                       - A single duodenal polyp. Resected and retrieved. Recommendation:       - Continue present medications.                       - Await pathology results.                       - Telephone GI clinic for pathology results in 1 week. Procedure Code(s):    --- Professional ---                       [phone], Esophagogastroduodenoscopy, flexible, transoral;                        with biopsy, single or multiple Diagnosis Code(s):    --- Professional ---                       K22.8, Other specified diseases of esophagus                       K22.70, Barrett's esophagus without dysplasia                       K22.4, Dyskinesia of esophagus                       K31.89, Other diseases of stomach and duodenum                       K44.9, Diaphragmatic hernia without obstruction or  gangrene                       K31.7, Polyp of stomach and duodenum CPT copyright 2016  American Medical Association. All rights reserved. The codes documented in this report are preliminary and upon coder review may  be revised to meet current compliance requirements. [name], MD 10/30/2017 9:22:37 AM This report has been signed electronically. Number of Addenda: 0 Note Initiated On: 10/30/2017 8:54 AM      [facility]

## 2017-10-30 NOTE — Anesthesia Preprocedure Evaluation (Signed)
Anesthesia Evaluation  Patient identified by MRN, date of birth, ID band Patient awake    Reviewed: Allergy & Precautions, NPO status , Patient's Chart, lab work & pertinent test results  History of Anesthesia Complications Negative for: history of anesthetic complications  Airway Mallampati: III       Dental   Pulmonary sleep apnea and Continuous Positive Airway Pressure Ventilation , neg COPD,           Cardiovascular hypertension, Pt. on medications (-) Past MI and (-) CHF + dysrhythmias (1 episode of Afib, cardioverted, no problems since) Atrial Fibrillation + Valvular Problems/Murmurs (murmur)      Neuro/Psych neg Seizures CVA (balance difficulties), Residual Symptoms    GI/Hepatic hiatal hernia, GERD  Medicated and Controlled,  Endo/Other  neg diabetes  Renal/GU Renal InsufficiencyRenal disease     Musculoskeletal   Abdominal   Peds  Hematology  (+) anemia ,   Anesthesia Other Findings   Reproductive/Obstetrics                            Anesthesia Physical Anesthesia Plan  ASA: III  Anesthesia Plan: General   Post-op Pain Management:    Induction: Intravenous  PONV Risk Score and Plan: 3 and TIVA, Propofol infusion and Ondansetron  Airway Management Planned: Nasal Cannula  Additional Equipment:   Intra-op Plan:   Post-operative Plan:   Informed Consent: I have reviewed the patients History and Physical, chart, labs and discussed the procedure including the risks, benefits and alternatives for the proposed anesthesia with the patient or authorized representative who has indicated his/her understanding and acceptance.     Plan Discussed with:   Anesthesia Plan Comments:         Anesthesia Quick Evaluation

## 2017-11-02 ENCOUNTER — Encounter: Payer: Self-pay | Admitting: Gastroenterology

## 2017-11-03 LAB — SURGICAL PATHOLOGY

## 2017-11-05 IMAGING — MG MM BREAST BX W/ LOC DEV 1ST LESION IMAGE BX SPEC STEREO GUIDE*R*
1 series · 1 of 1 positions shown · non-contrast
Comparison: Previous exams.

ADDENDUM:
Pathology of the right breast biopsy revealed SMALL NODULES OF
FIBROSIS CONTAINING DYSTROPHIC CALCIFICATIONS, WITH ADJACENT
HISTIOCYTIC INFILTRATES, COMPATIBLE WITH OLD FAT NECROSIS. NEGATIVE
FOR ATYPIA AND MALIGNANCY. This was found to be concordant by Dr.
Alpizo.

Recommendations:  Return to screening mammogram in one year.
At the patient's request, pathology and recommendations were relayed
to the patient by phone. She stated she has had no bleeding,
bruising, or hematoma but has developed itching under the bandage.
She was instructed to remove the bandage and apply a band-aid. Post
biopsy instructions were reviewed with the patient. All of her
questions were answered. She was encouraged to call the [HOSPITAL]
[REDACTED] or Barraza, Kyron ([REDACTED]) with any further questions or concerns.
Pathology and recommendations relayed by Hildebrand, Esmeralda on
09/28/15.
CLINICAL DATA: Patient presents for stereotactic core needle biopsy
of right breast calcifications.
EXAM:
RIGHT BREAST STEREOTACTIC CORE NEEDLE BIOPSY

[R CC]
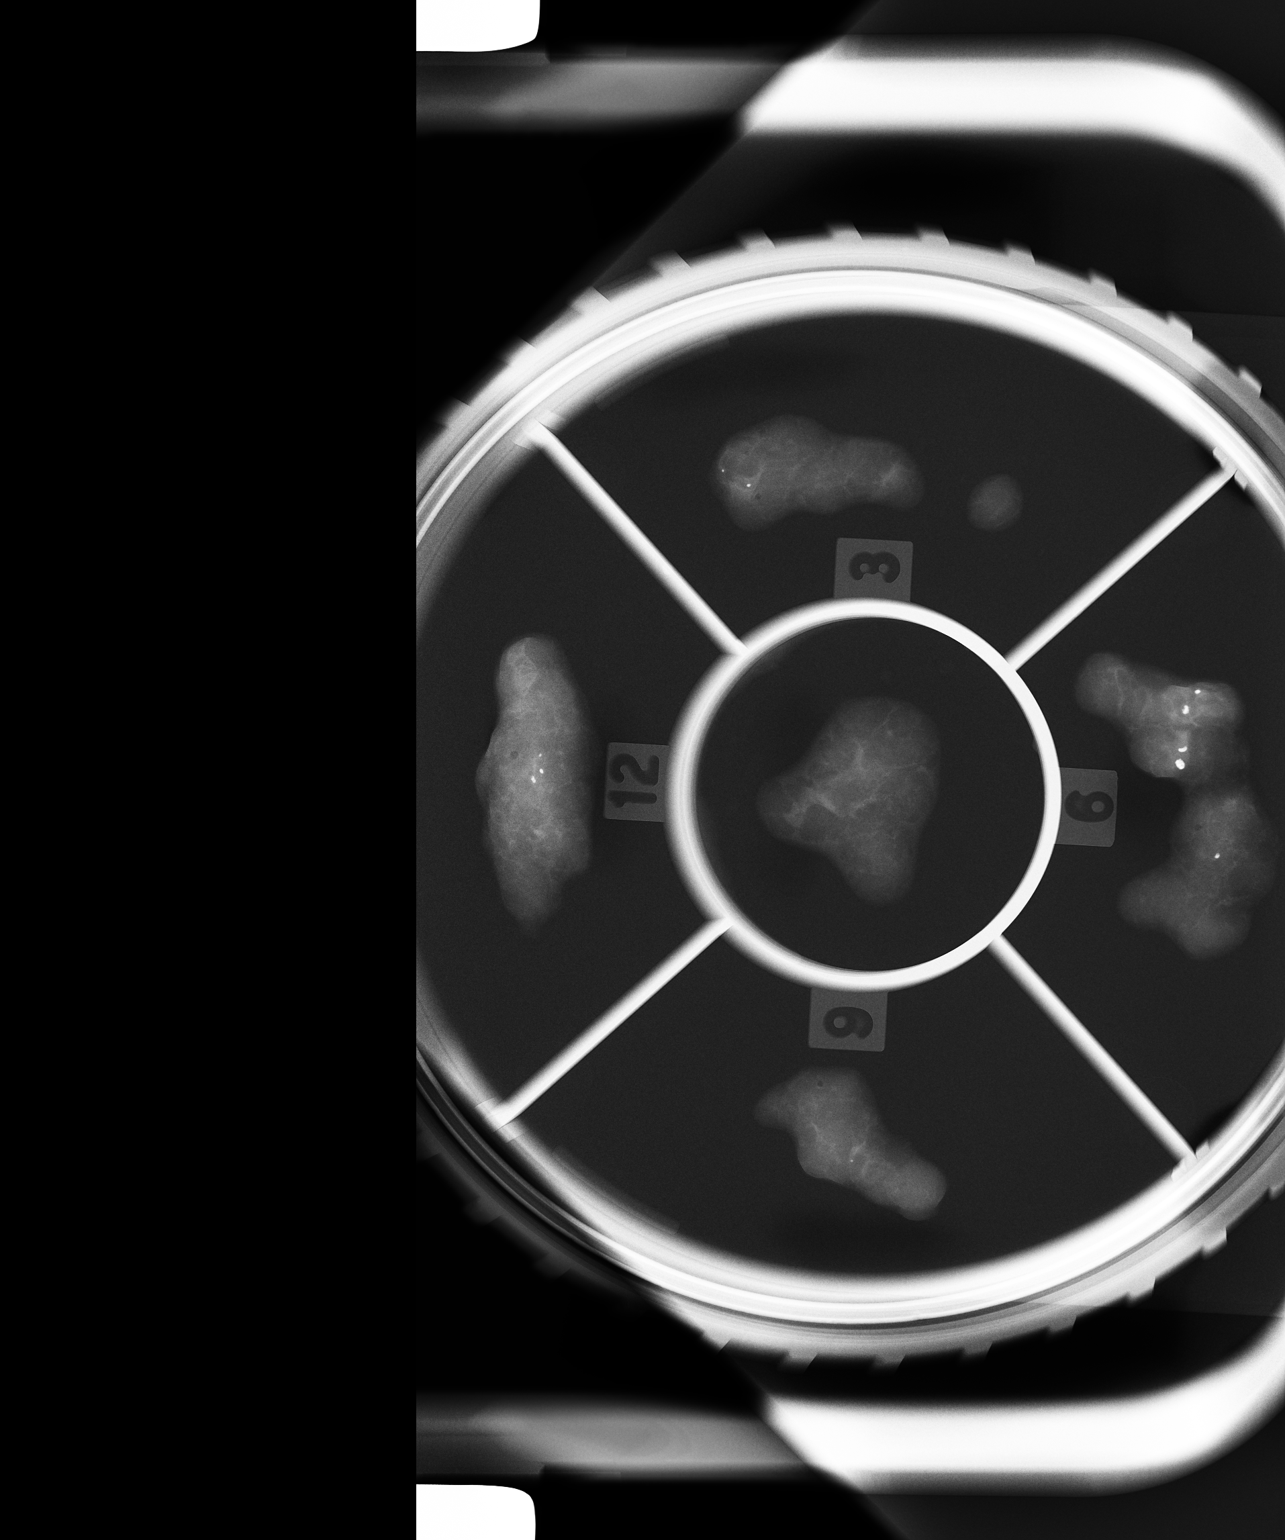

[1 of 1 positions shown; findings below may reference images not displayed]



Using sterile technique and 1% lidocaine as local anesthetic, under
stereotactic guidance, a 9 gauge vacuum assist device was used to
perform core needle biopsy of calcifications in upper inner quadrant
of the right breast using a cranial to caudal approach. Specimen
radiograph was performed showing multiple calcifications for which
biopsy was performed. Specimens with calcifications are identified
for pathology.

At the conclusion of the procedure, a top hat shaped tissue marker
clip was deployed into the biopsy cavity. Follow-up 2-view mammogram
was performed and dictated separately.
IMPRESSION: Stereotactic-guided biopsy of right breast calcifications. No
apparent complications.

## 2017-12-15 ENCOUNTER — Encounter: Payer: Self-pay | Admitting: Obstetrics and Gynecology

## 2017-12-15 ENCOUNTER — Ambulatory Visit (INDEPENDENT_AMBULATORY_CARE_PROVIDER_SITE_OTHER): Payer: Medicare Other | Admitting: Obstetrics and Gynecology

## 2017-12-15 VITALS — BP 124/80 | HR 72 | Ht 66.0 in | Wt 227.9 lb

## 2017-12-15 DIAGNOSIS — E669 Obesity, unspecified: Secondary | ICD-10-CM | POA: Diagnosis not present

## 2017-12-15 DIAGNOSIS — Z01419 Encounter for gynecological examination (general) (routine) without abnormal findings: Secondary | ICD-10-CM | POA: Diagnosis not present

## 2017-12-15 DIAGNOSIS — Z1231 Encounter for screening mammogram for malignant neoplasm of breast: Secondary | ICD-10-CM

## 2017-12-15 DIAGNOSIS — M8580 Other specified disorders of bone density and structure, unspecified site: Secondary | ICD-10-CM | POA: Diagnosis not present

## 2017-12-15 DIAGNOSIS — Z1211 Encounter for screening for malignant neoplasm of colon: Secondary | ICD-10-CM | POA: Diagnosis not present

## 2017-12-15 DIAGNOSIS — N951 Menopausal and female climacteric states: Secondary | ICD-10-CM | POA: Diagnosis not present

## 2017-12-15 NOTE — Progress Notes (Signed)
Pt is doing well.

## 2017-12-15 NOTE — Progress Notes (Signed)
GYNECOLOGY CLINIC PROGRESS NOTE  Subjective:    Jaclyn Galvan is a 77 y.o. [phone] postmenopausal female who presents for an annual exam. The patient has no complaints today. The patient is not currently sexually active.The patient wears seatbelts: yes. The patient participates in regular exercise: no. Has the patient ever been transfused or tattooed?: no. The patient reports that there is not domestic violence in her life.  The patient currently experiences occasional hot flushes, for which she treats with OTC Estroven.  Denies episodes of postmenopausal bleeding.    Gynecologic History:  No LMP recorded. Patient is postmenopausal. Last pap smear: ~ 2-3 years ago. Results were: normal. Denies history of abnormal pap smears.  Last mammogram: 10/08/2016.  Results were normal. Does have a h/o abnormal mammogram in 2016, followed by left breast biopsy which was benign.  Last colonoscopy: Patient unsure of when colonoscopy was.  Has had several endoscopies.  Last Dexa Scan: 08/2014.  Results were: osteopenia, T score of -1.4  Past Medical History:  Diagnosis Date  . Anemia    resolved from the beginning of this year  . Atrial fibrillation [facility])    cardioversion March 2004  . Cholelithiasis   . Chronic gastritis   . CKD (chronic kidney disease)    chronic kidney disease stage III  . Diverticulosis   . 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 DJD (degenerative joint disease)   . Elbow injury   . Fibromyalgia   . HH (hiatus hernia)   . Hypercholesterolemia    controlled with meds  . Hypertension    controlled with meds, checks regularly  . Liver cyst   . Osteopenia   . PPD positive   . Sleep apnea     Family History  Problem Relation Age of Onset  . Breast cancer Mother  (deceased at age 4)  . Diabetes Neg Hx   . Heart disease Neg Hx   . Colon cancer Neg Hx   . Ovarian cancer Neg Hx    Past Surgical History:  Procedure Laterality  Date  . ADENOIDECTOMY    . Adenomatous polyp of duodenum    . APPENDECTOMY    . BACK SURGERY    . BILATERAL OOPHORECTOMY Right   . BREAST BIOPSY Right    1988 negative  . BREAST BIOPSY Right 09/25/2015   stereo unknown  . BREAST EXCISIONAL BIOPSY Left    ? when  . CHOLECYSTECTOMY    . DILATION AND CURETTAGE OF UTERUS  1966  . ESOPHAGOGASTRODUODENOSCOPY (EGD) WITH PROPOFOL N/A 07/31/2015   Procedure: ESOPHAGOGASTRODUODENOSCOPY (EGD) WITH PROPOFOL;  Surgeon: [name], MD;  Location: [facility] ENDOSCOPY;  Service: Endoscopy;  Laterality: N/A;  . ESOPHAGOGASTRODUODENOSCOPY (EGD) WITH PROPOFOL N/A 05/06/2016   Procedure: ESOPHAGOGASTRODUODENOSCOPY (EGD) WITH PROPOFOL;  Surgeon: [name], MD;  Location: [facility] ENDOSCOPY;  Service: Endoscopy;  Laterality: N/A;  . ESOPHAGOGASTRODUODENOSCOPY (EGD) WITH PROPOFOL N/A 10/30/2017   Procedure: ESOPHAGOGASTRODUODENOSCOPY (EGD) WITH PROPOFOL;  Surgeon: [name], MD;  Location: [facility] ENDOSCOPY;  Service: Endoscopy;  Laterality: N/A;  . JOINT REPLACEMENT     total knee Dr. Sabra Heck with excessive postoperative bleeding  . RHIZOTOMY    . TONSILLECTOMY    . TOTAL KNEE ARTHROPLASTY Bilateral    2001,2006  . TUBAL LIGATION  1971     Social History   Socioeconomic History  . Marital status: Married    Spouse name: Not on file  . Number of children:  Not on file  . Years of education: Not on file  . Highest education level: Not on file  Social Needs  . Financial resource strain: Not on file  . Food insecurity - worry: Not on file  . Food insecurity - inability: Not on file  . Transportation needs - medical: Not on file  . Transportation needs - non-medical: Not on file  Occupational History  . Not on file  Tobacco Use  . Smoking status: Never Smoker  . Smokeless tobacco: Never Used  Substance and Sexual Activity  . Alcohol use: No    Alcohol/week: 0.0 oz  . Drug use: No  . Sexual activity: Not Currently    Birth  control/protection: Post-menopausal  Other Topics Concern  . Not on file  Social History Narrative  . Not on file   Current Outpatient Medications on File Prior to Visit  Medication Sig Dispense Refill  . AMLODIPINE-ATORVASTATIN PO Take by mouth.    Marland Kitchen aspirin EC 81 MG tablet Take 81 mg by mouth daily.    Marland Kitchen aspirin-acetaminophen-caffeine (EXCEDRIN MIGRAINE) 250-250-65 MG per tablet Take by mouth every 6 (six) hours as needed for headache.    Marland Kitchen atorvastatin (LIPITOR) 10 MG tablet Take 5 mg by mouth daily.     . Black Cohosh-SoyIsoflav-Magnol (ESTROVEN MENOPAUSE RELIEF) CAPS Take by mouth at bedtime.     . calcium carbonate 1250 MG capsule Take 1,250 mg by mouth 2 (two) times daily with a meal.    . cholecalciferol (VITAMIN D) 400 UNITS TABS tablet Take 400 Units by mouth.    . co-enzyme Q-10 30 MG capsule Take 30 mg by mouth daily.     Marland Kitchen donepezil (ARICEPT) 5 MG tablet Take 5 mg by mouth at bedtime.    . DULoxetine (CYMBALTA) 20 MG capsule Take 30 mg by mouth daily.     Marland Kitchen losartan (COZAAR) 25 MG tablet Take 50 mg by mouth daily.     . Multiple Vitamin (MULTIVITAMIN) tablet Take 1 tablet by mouth daily.    . Nutritional Supplements (ESTROVEN PMS PO) Take by mouth.    . pantoprazole (PROTONIX) 40 MG tablet Take 40 mg by mouth 2 (two) times daily.    . vitamin B-12 (CYANOCOBALAMIN) 100 MCG tablet Take 100 mcg by mouth daily.    Marland Kitchen UNABLE TO FIND      No current facility-administered medications on file prior to visit.     Allergies  Allergen Reactions  . Ciprofloxacin   . Morphine And Related     Review of Systems Constitutional: negative for chills, fatigue, fevers and sweats Eyes: negative for irritation, redness and visual disturbance Ears, nose, mouth, throat, and face: negative for hearing loss, nasal congestion, snoring and tinnitus Respiratory: negative for asthma, cough, sputum Cardiovascular: negative for chest pain, dyspnea, exertional chest pressure/discomfort, irregular  heart beat, palpitations and syncope Gastrointestinal: negative for abdominal pain, change in bowel habits, nausea and vomiting Genitourinary: Negative for abnormal menstrual periods, genital lesions, sexual problems and vaginal discharge, dysuria and urinary incontinence. Integument/breast: negative for breast lump, breast tenderness and nipple discharge Hematologic/lymphatic: negative for bleeding and easy bruising Musculoskeletal:negative for back pain and muscle weakness Neurological: negative for dizziness, headaches, vertigo and weakness Endocrine: negative for diabetic symptoms including polydipsia, polyuria and skin dryness Allergic/Immunologic: negative for hay fever and urticaria     Objective:  Blood pressure 124/80, pulse 72, height 5\' 6"  (1.676 m), weight 227 lb 14.4 oz (103.4 kg). Body mass index is 36.78 kg/m.  General Appearance:    Alert, cooperative, no distress, appears stated age, moderate obesity  Head:    Normocephalic, without obvious abnormality, atraumatic  Eyes:    PERRL, conjunctiva/corneas clear, EOM's intact, both eyes  Ears:    Normal external ear canals, both ears  Nose:   Nares normal, septum midline, mucosa normal, no drainage or sinus tenderness  Throat:   Lips, mucosa, and tongue normal; teeth and gums normal  Neck:   Supple, symmetrical, trachea midline, no adenopathy; thyroid: no enlargement/tenderness/nodules; no carotid bruit or JVD  Back:     Symmetric, no curvature, ROM normal, no CVA tenderness  Lungs:     Clear to auscultation bilaterally, respirations unlabored  Chest Wall:    No tenderness or deformity   Heart:    Regular rate and rhythm, S1 and S2 normal, no murmur, rub or gallop  Breast Exam:    No tenderness, masses, or nipple abnormality  Abdomen:     Soft, non-tender, bowel sounds active all four quadrants, no masses, no organomegaly. Vertical accentric inframumbilical scar present.   Genitalia:    Pelvic:external genitalia normal, vagina  without lesions, discharge, or tenderness.  Mild atrophy present. Rectovaginal septum  normal. Cervix normal in appearance, no cervical motion tenderness, no adnexal masses or tenderness.  Senile uterus, nontender.    Rectal:    Normal external sphincter.  No hemorrhoids appreciated. Internal exam not done.   Extremities:   Extremities normal, atraumatic, no cyanosis or edema.  Moderate varicose veins present in lower extremities bilaterally.   Pulses:   2+ and symmetric all extremities  Skin:   Skin color, texture, turgor normal, no rashes or lesions  Lymph nodes:   Cervical, supraclavicular, and axillary nodes normal  Neurologic:   CNII-XII intact, normal strength, sensation and reflexes throughout     Assessment:    Healthy postmenopausal female exam.    Remote h/o breast cancer Menopausal  Osteopenia  Plan:   Labs: performed by PCP.   Breast self exam technique reviewed and patient encouraged to perform self-exam monthly. Discussed healthy lifestyle modifications.  Mammogram. To be ordered.   Dexa Scan with T score of -1.4 on 08/2014. H/o osteoporosis s/p treatment.  Will need repeat this year however insurance will not cover.  To continue Vit D and Calcium supplementation.  Can continue with Estroven as needed for menopausal mild vasomotor symptoms.  Will order Colonoscopy if has not been performed within past 10 years.  RTC in 1 year.    Rubie Maid, MD [facility]

## 2017-12-15 NOTE — Patient Instructions (Signed)
Health Maintenance for Postmenopausal Women Menopause is a normal process in which your reproductive ability comes to an end. This process happens gradually over a span of months to years, usually between the ages of 22 and 9. Menopause is complete when you have missed 12 consecutive menstrual periods. It is important to talk with your health care provider about some of the most common conditions that affect postmenopausal women, such as heart disease, cancer, and bone loss (osteoporosis). Adopting a healthy lifestyle and getting preventive care can help to promote your health and wellness. Those actions can also lower your chances of developing some of these common conditions. What should I know about menopause? During menopause, you may experience a number of symptoms, such as:  Moderate-to-severe hot flashes.  Night sweats.  Decrease in sex drive.  Mood swings.  Headaches.  Tiredness.  Irritability.  Memory problems.  Insomnia.  Choosing to treat or not to treat menopausal changes is an individual decision that you make with your health care provider. What should I know about hormone replacement therapy and supplements? Hormone therapy products are effective for treating symptoms that are associated with menopause, such as hot flashes and night sweats. Hormone replacement carries certain risks, especially as you become older. If you are thinking about using estrogen or estrogen with progestin treatments, discuss the benefits and risks with your health care provider. What should I know about heart disease and stroke? Heart disease, heart attack, and stroke become more likely as you age. This may be due, in part, to the hormonal changes that your body experiences during menopause. These can affect how your body processes dietary fats, triglycerides, and cholesterol. Heart attack and stroke are both medical emergencies. There are many things that you can do to help prevent heart disease  and stroke:  Have your blood pressure checked at least every 1-2 years. High blood pressure causes heart disease and increases the risk of stroke.  If you are 53-22 years old, ask your health care provider if you should take aspirin to prevent a heart attack or a stroke.  Do not use any tobacco products, including cigarettes, chewing tobacco, or electronic cigarettes. If you need help quitting, ask your health care provider.  It is important to eat a healthy diet and maintain a healthy weight. ? Be sure to include plenty of vegetables, fruits, low-fat dairy products, and lean protein. ? Avoid eating foods that are high in solid fats, added sugars, or salt (sodium).  Get regular exercise. This is one of the most important things that you can do for your health. ? Try to exercise for at least 150 minutes each week. The type of exercise that you do should increase your heart rate and make you sweat. This is known as moderate-intensity exercise. ? Try to do strengthening exercises at least twice each week. Do these in addition to the moderate-intensity exercise.  Know your numbers.Ask your health care provider to check your cholesterol and your blood glucose. Continue to have your blood tested as directed by your health care provider.  What should I know about cancer screening? There are several types of cancer. Take the following steps to reduce your risk and to catch any cancer development as early as possible. Breast Cancer  Practice breast self-awareness. ? This means understanding how your breasts normally appear and feel. ? It also means doing regular breast self-exams. Let your health care provider know about any changes, no matter how small.  If you are 40  or older, have a clinician do a breast exam (clinical breast exam or CBE) every year. Depending on your age, family history, and medical history, it may be recommended that you also have a yearly breast X-ray (mammogram).  If you  have a family history of breast cancer, talk with your health care provider about genetic screening.  If you are at high risk for breast cancer, talk with your health care provider about having an MRI and a mammogram every year.  Breast cancer (BRCA) gene test is recommended for women who have family members with BRCA-related cancers. Results of the assessment will determine the need for genetic counseling and BRCA1 and for BRCA2 testing. BRCA-related cancers include these types: ? Breast. This occurs in males or females. ? Ovarian. ? Tubal. This may also be called fallopian tube cancer. ? Cancer of the abdominal or pelvic lining (peritoneal cancer). ? Prostate. ? Pancreatic.  Cervical, Uterine, and Ovarian Cancer Your health care provider may recommend that you be screened regularly for cancer of the pelvic organs. These include your ovaries, uterus, and vagina. This screening involves a pelvic exam, which includes checking for microscopic changes to the surface of your cervix (Pap test).  For women ages 21-65, health care providers may recommend a pelvic exam and a Pap test every three years. For women ages 79-65, they may recommend the Pap test and pelvic exam, combined with testing for human papilloma virus (HPV), every five years. Some types of HPV increase your risk of cervical cancer. Testing for HPV may also be done on women of any age who have unclear Pap test results.  Other health care providers may not recommend any screening for nonpregnant women who are considered low risk for pelvic cancer and have no symptoms. Ask your health care provider if a screening pelvic exam is right for you.  If you have had past treatment for cervical cancer or a condition that could lead to cancer, you need Pap tests and screening for cancer for at least 20 years after your treatment. If Pap tests have been discontinued for you, your risk factors (such as having a new sexual partner) need to be  reassessed to determine if you should start having screenings again. Some women have medical problems that increase the chance of getting cervical cancer. In these cases, your health care provider may recommend that you have screening and Pap tests more often.  If you have a family history of uterine cancer or ovarian cancer, talk with your health care provider about genetic screening.  If you have vaginal bleeding after reaching menopause, tell your health care provider.  There are currently no reliable tests available to screen for ovarian cancer.  Lung Cancer Lung cancer screening is recommended for adults 69-62 years old who are at high risk for lung cancer because of a history of smoking. A yearly low-dose CT scan of the lungs is recommended if you:  Currently smoke.  Have a history of at least 30 pack-years of smoking and you currently smoke or have quit within the past 15 years. A pack-year is smoking an average of one pack of cigarettes per day for one year.  Yearly screening should:  Continue until it has been 15 years since you quit.  Stop if you develop a health problem that would prevent you from having lung cancer treatment.  Colorectal Cancer  This type of cancer can be detected and can often be prevented.  Routine colorectal cancer screening usually begins at  age 42 and continues through age 45.  If you have risk factors for colon cancer, your health care provider may recommend that you be screened at an earlier age.  If you have a family history of colorectal cancer, talk with your health care provider about genetic screening.  Your health care provider may also recommend using home test kits to check for hidden blood in your stool.  A small camera at the end of a tube can be used to examine your colon directly (sigmoidoscopy or colonoscopy). This is done to check for the earliest forms of colorectal cancer.  Direct examination of the colon should be repeated every  5-10 years until age 71. However, if early forms of precancerous polyps or small growths are found or if you have a family history or genetic risk for colorectal cancer, you may need to be screened more often.  Skin Cancer  Check your skin from head to toe regularly.  Monitor any moles. Be sure to tell your health care provider: ? About any new moles or changes in moles, especially if there is a change in a mole's shape or color. ? If you have a mole that is larger than the size of a pencil eraser.  If any of your family members has a history of skin cancer, especially at a young age, talk with your health care provider about genetic screening.  Always use sunscreen. Apply sunscreen liberally and repeatedly throughout the day.  Whenever you are outside, protect yourself by wearing long sleeves, pants, a wide-brimmed hat, and sunglasses.  What should I know about osteoporosis? Osteoporosis is a condition in which bone destruction happens more quickly than new bone creation. After menopause, you may be at an increased risk for osteoporosis. To help prevent osteoporosis or the bone fractures that can happen because of osteoporosis, the following is recommended:  If you are 46-71 years old, get at least 1,000 mg of calcium and at least 600 mg of vitamin D per day.  If you are older than age 55 but younger than age 65, get at least 1,200 mg of calcium and at least 600 mg of vitamin D per day.  If you are older than age 54, get at least 1,200 mg of calcium and at least 800 mg of vitamin D per day.  Smoking and excessive alcohol intake increase the risk of osteoporosis. Eat foods that are rich in calcium and vitamin D, and do weight-bearing exercises several times each week as directed by your health care provider. What should I know about how menopause affects my mental health? Depression may occur at any age, but it is more common as you become older. Common symptoms of depression  include:  Low or sad mood.  Changes in sleep patterns.  Changes in appetite or eating patterns.  Feeling an overall lack of motivation or enjoyment of activities that you previously enjoyed.  Frequent crying spells.  Talk with your health care provider if you think that you are experiencing depression. What should I know about immunizations? It is important that you get and maintain your immunizations. These include:  Tetanus, diphtheria, and pertussis (Tdap) booster vaccine.  Influenza every year before the flu season begins.  Pneumonia vaccine.  Shingles vaccine.  Your health care provider may also recommend other immunizations. This information is not intended to replace advice given to you by your health care provider. Make sure you discuss any questions you have with your health care provider. Document Released: 11/14/2005  Document Revised: 04/11/2016 Document Reviewed: 06/26/2015 Elsevier Interactive Patient Education  2018 [employer]  

## 2018-02-04 ENCOUNTER — Telehealth: Payer: Self-pay | Admitting: Obstetrics and Gynecology

## 2018-02-04 NOTE — Telephone Encounter (Signed)
The patient called and stated that she would like to speak with a nurse in regards to some questions she has. No other information was disclosed. Please advise.

## 2018-02-05 NOTE — Telephone Encounter (Signed)
Pt was called and operation came on stating that the line was busy was unable to leave a voicemail.

## 2018-02-10 NOTE — Telephone Encounter (Signed)
Pt called left message to call the office.

## 2018-04-28 ENCOUNTER — Ambulatory Visit: Payer: Medicare Other | Attending: Internal Medicine

## 2018-04-28 DIAGNOSIS — R262 Difficulty in walking, not elsewhere classified: Secondary | ICD-10-CM | POA: Diagnosis present

## 2018-04-28 DIAGNOSIS — R2681 Unsteadiness on feet: Secondary | ICD-10-CM | POA: Diagnosis present

## 2018-04-28 DIAGNOSIS — M6281 Muscle weakness (generalized): Secondary | ICD-10-CM | POA: Diagnosis present

## 2018-04-28 DIAGNOSIS — Z9181 History of falling: Secondary | ICD-10-CM

## 2018-04-28 NOTE — Patient Instructions (Signed)
Seated knee flexion AAROM using ball to promote ROM 10x3 with 5 second holds  Reviewed and given as part of her HEP for both knees. Pt and husband demonstrated and verbalized understanding.

## 2018-04-28 NOTE — Therapy (Signed)
[facility] [address] [place], [address] Phone: [phone]   Fax:  [phone]  Physical Therapy Evaluation  Patient Details  Name: Jaclyn Galvan MRN: [id number] Date of Birth: [birth year] Referring Provider: Tama High, MD   Encounter Date: 04/28/2018  PT End of Session - 04/28/18 1617    Visit Number  1    Number of Visits  17    Date for PT Re-Evaluation  06/24/18    Authorization Type  1    Authorization Time Period  of 10 progress report    PT Start Time  1618    PT Stop Time  1736    PT Time Calculation (min)  78 min    Equipment Utilized During Treatment  Gait belt Pt SPC    Activity Tolerance  Patient tolerated treatment well    Behavior During Therapy  WFL for tasks assessed/performed       Past Medical History:  Diagnosis Date  . Anemia    resolved from the beginning of this year  . Atrial fibrillation [facility])    cardioversion March 2004  . Cholelithiasis   . Chronic gastritis   . CKD (chronic kidney disease)    chronic kidney disease stage III  . Diverticulosis   . 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 DJD (degenerative joint disease)   . Elbow injury   . Fibromyalgia   . HH (hiatus hernia)   . Hypercholesterolemia    controlled with meds  . Hypertension    controlled with meds, checks regularly  . Liver cyst   . Osteopenia   . PPD positive   . Sleep apnea     Past Surgical History:  Procedure Laterality Date  . ADENOIDECTOMY    . Adenomatous polyp of duodenum    . APPENDECTOMY    . BACK SURGERY    . BILATERAL OOPHORECTOMY Right   . BREAST BIOPSY Right    1988 negative  . BREAST BIOPSY Right 09/25/2015   stereo unknown  . BREAST EXCISIONAL BIOPSY Left    ? when  . CHOLECYSTECTOMY    . DILATION AND CURETTAGE OF UTERUS  1966  . ESOPHAGOGASTRODUODENOSCOPY (EGD) WITH PROPOFOL N/A 07/31/2015   Procedure:  ESOPHAGOGASTRODUODENOSCOPY (EGD) WITH PROPOFOL;  Surgeon: [name], MD;  Location: [facility] ENDOSCOPY;  Service: Endoscopy;  Laterality: N/A;  . ESOPHAGOGASTRODUODENOSCOPY (EGD) WITH PROPOFOL N/A 05/06/2016   Procedure: ESOPHAGOGASTRODUODENOSCOPY (EGD) WITH PROPOFOL;  Surgeon: [name], MD;  Location: [facility] ENDOSCOPY;  Service: Endoscopy;  Laterality: N/A;  . ESOPHAGOGASTRODUODENOSCOPY (EGD) WITH PROPOFOL N/A 10/30/2017   Procedure: ESOPHAGOGASTRODUODENOSCOPY (EGD) WITH PROPOFOL;  Surgeon: [name], MD;  Location: [facility] ENDOSCOPY;  Service: Endoscopy;  Laterality: N/A;  . JOINT REPLACEMENT     total knee Dr. Sabra Heck with excessive postoperative bleeding  . RHIZOTOMY    . TONSILLECTOMY    . TOTAL KNEE ARTHROPLASTY Bilateral    2001,2006  . TUBAL LIGATION  1971    There were no vitals filed for this visit.   Subjective Assessment - 04/28/18 1625    Subjective  Back pain: 0/10 currently (pt sitting); 3/10 at most for the past 3 months (takes Excedrin every morning when pt gets up).     Pertinent History  Balance/leg weakness. Chronic issue. 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 Also had bilateral knee replacements and pt was not  able to get the bend back in her knees.  January 2018, pt fainted. Had tests which revealed pt had a stroke a while ago. Pt husband said that he thinks it did not affect anyrhing.  Pt has difficulty getting up from a chair without help.  Had a fall around May 2019 when pt was in the shower.  Pt was trying to get up from her shower chair and her foot slipped. Did not injure anything.  No other falls since then. Pt husband also states that pt's head gets ahead of herself, meaning what her brain tells her feet to do is not fast enough to react.   Pt states fear of falling but does not keep her from doing activities or getting out of her home.    Pt states not having back, hip, or knee pain currently. Occasional back pain for the  past 3 months.     Patient Stated Goals  Pt expresses desire to get better.     Currently in Pain?  No/denies    Aggravating Factors   Balance: turning quickly, sudden movements (if someone calls her name from behind her and she turns too quickly)         [facility] PT Assessment - 04/28/18 1616      Assessment   Medical Diagnosis  Primary osteoarthritis, unspecified side; history of degenerative disc disease balance issues/leg weakness    Referring Provider  Tama High, MD    Onset Date/Surgical Date  04/02/18 Date PT referral signed. Chronic condition    Prior Therapy  Pt states participating in PT before for balance which she feels helped.       Precautions   Precaution Comments  fall risk; hx of osteopenia      Restrictions   Other Position/Activity Restrictions  no known weight bearing restrictions      Balance Screen   Has the patient fallen in the past 6 months  Yes    How many times?  1 pt was trying to stand up from shower chair    Has the patient had a decrease in activity level because of a fear of falling?   No Pt states having a fear of falling    Is the patient reluctant to leave their home because of a fear of falling?   No Pt states having a fear of falling      Home Environment   Additional Comments  Pt lives in a 3 story condo with an Media planner with her husband. No steps to enter.       Prior Function   Vocation  Retired    Biomedical scientist  PLOF: better able to turn around more quickly without losing balance, decreased difficulty standing up from a chair      Observation/Other Assessments   Focus on Therapeutic Outcomes (FOTO)   LE FOTO 49      Posture/Postural Control   Posture Comments  bilateral genu valgus R > L , R hip ER, slight R anterior pelvic rotation, kyphosis, bilaterally protracted shoulders, protracted neck      AROM   Right Knee Extension  -14    Right Knee Flexion  73 81 degrees AAROM, stiff end feel    Left Knee Extension  -12    Left  Knee Flexion  80 92 degrees AAROM, stiff end feel      Strength   Right Hip Flexion  4-/5    Right Hip Extension  4/5 seated manually resisted hip  extension isometrics    Right Hip ABduction  4-/5 seated clamshell isometrics    Left Hip Flexion  4-/5    Left Hip Extension  4-/5 seated manually resisted hip extension isometrics    Left Hip ABduction  4-/5 seated clamshell isometrics    Right Knee Flexion  4+/5    Right Knee Extension  4/5    Left Knee Flexion  4/5    Left Knee Extension  4+/5      Ambulation/Gait   Gait Comments  decreased stance L LE, bilateral pelvic drop, lateral lean. Uses SPC on R side.       Berg Balance Test   Sit to Stand  Able to stand using hands after several tries    Standing Unsupported  Able to stand safely 2 minutes    Sitting with Back Unsupported but Feet Supported on Floor or Stool  Able to sit safely and securely 2 minutes    Stand to Sit  Controls descent by using hands    Transfers  Able to transfer safely, definite need of hands    Standing Unsupported with Eyes Closed  Able to stand 10 seconds safely    Standing Ubsupported with Feet Together  Able to place feet together independently and stand 1 minute safely    From Standing, Reach Forward with Outstretched Arm  Can reach forward >12 cm safely (5")    From Standing Position, Pick up Object from Floor  Able to pick up shoe, needs supervision    From Standing Position, Turn to Look Behind Over each Shoulder  Looks behind one side only/other side shows less weight shift Better weight shift onto L LE when turning to L    Turn 360 Degrees  Able to turn 360 degrees safely but slowly    Standing Unsupported, Alternately Place Feet on Step/Stool  Needs assistance to keep from falling or unable to try LOB with stepping with L LE (R LE weight bearing     Standing Unsupported, One Foot in [employer] balance while stepping or standing able to perform but LOB when returing to starting position    Standing  on One Leg  Unable to try or needs assist to prevent fall 2 seconds SLS each LE    Total Score  35    Berg comment:  < 46/56 suggests increased risk for falls and need for AD use                Objective measurements completed on examination: See above findings.    osteopenia  Pt husband is observed to help pt stand up from a chair, hand held assist to her R hand.   Blood pressure is controlled per pt.    Decreased balance when on R LE weight bearing during toe taps onto step during Berg Balance testing   Therapeutic exercise  Seated R knee flexion AAROM using ball to promote ROM 10x3 with 5 second holds  Reviewed and given as part of her HEP for both knees. Pt and husband demonstrated and verbalized understanding.    80 degrees seated R knee flexion AROM afterwards   Seated R knee flexion PROM with PT (with pillow padding on leg for comfort) 10x3 to end range to help decrease stiffness. 82 degrees seated R knee flexion AROM afterwards    Improved exercise technique, movement at target joints, use of target muscles after mod verbal, visual, tactile cues.     Patient is a 77 year old  female who came to physical therapy secondary to decreased balance and bilateral LE weakness. She also presents with limited bilateral knee flexion ROM R > L making standing up from sitting challenging for her, as well as poor posture, antalgic gait pattern, a Berg Balance score of under 46/56 suggesting increased risk for falls (increased need for use of AD), and difficulty performing functional tasks such as turning around quickly if someone calls her due to balance difficulty. Patient will benefit from skilled physical therapy services to address the aforementioned deficits.       PT Education - 04/28/18 1822    Education Details  Ther-ex, HEP, plan of care    Person(s) Educated  Patient    Methods  Explanation;Demonstration;Tactile cues;Verbal cues    Comprehension  Returned  demonstration;Verbalized understanding       PT Short Term Goals - 04/28/18 1757      PT SHORT TERM GOAL #1   Title  Patient will be independent with her HEP to promote ability to stand up from a chair, improve LE strength, and improve balance.     Time  3    Period  Weeks    Target Date  05/20/18        PT Long Term Goals - 04/28/18 1758      PT LONG TERM GOAL #1   Title  Patient will improve bilateral knee flexion ROM by at least 12 degrees to promote ability to stand up from her shower chair with less difficulty to decrease fall risk     Baseline  Knee flexion AROM: 73 degrees R, 80 degrees L (04/28/2018)    Time  8    Period  Weeks    Status  New    Target Date  06/24/18      PT LONG TERM GOAL #2   Title  Patient will improve bilateral LE strength by at least 1/2 MMT grade to promote ability to perform standing tasks as well as decrease fall risk.      Time  8    Period  Weeks    Status  New    Target Date  06/24/18      PT LONG TERM GOAL #3   Title  Patient will improve her Berg Balance score by at least 6 points as a demonstration of improved balance.     Baseline  35/56 (<46 suggests increased fall risk; 04/28/2018)    Time  8    Period  Weeks    Status  New    Target Date  06/24/18      PT LONG TERM GOAL #4   Title  Patient will improve her FOTO score by at least 8 points as a demonstration of improved function.     Baseline  LE FOTO 49 (04/28/2018)    Time  8    Period  Weeks    Status  New    Target Date  06/24/18             Plan - 04/28/18 1751    Clinical Impression Statement  Patient is a 77 year old female who came to physical therapy secondary to decreased balance and bilateral LE weakness. She also presents with limited bilateral knee flexion ROM R > L making standing up from sitting challenging for her, as well as poor posture, antalgic gait pattern, a Berg Balance score of under 46/56 suggesting increased risk for falls (increased need for use  of AD), and difficulty performing functional  tasks such as turning around quickly if someone calls her due to balance difficulty. Patient will benefit from skilled physical therapy services to address the aforementioned deficits.     History and Personal Factors relevant to plan of care:  Chronicity of condition, limited bilateral knee flexion ROM, bilateral LE weakness, medical history, fall risk, good husband support    Clinical Presentation  Stable    Clinical Presentation due to:  No falls since May 2019    Clinical Decision Making  Low    Rehab Potential  Fair    Clinical Impairments Affecting Rehab Potential  (-) age, chronicity of condition, weakness, limited bilateral knee flexion ROM, decreased balance; (+) good husband support    PT Frequency  2x / week    PT Duration  8 weeks    PT Treatment/Interventions  Balance training;Neuromuscular re-education;Patient/family education;Manual techniques;Passive range of motion;Dry needling;Therapeutic activities;Therapeutic exercise;Gait training;Electrical Stimulation;Iontophoresis 4mg /ml Dexamethasone;Aquatic Therapy    PT Next Visit Plan  knee flexion ROM, glute and quadriceps strengthening, balance training, manual techniques, modalities PRN    Consulted and Agree with Plan of Care  Patient;Family member/caregiver    Family Member Consulted  husband       Patient will benefit from skilled therapeutic intervention in order to improve the following deficits and impairments:  Postural dysfunction, Improper body mechanics, Difficulty walking, Decreased strength, Decreased range of motion, Decreased balance  Visit Diagnosis: History of falling - Plan: PT plan of care cert/re-cert  Unsteadiness on feet - Plan: PT plan of care cert/re-cert  Muscle weakness (generalized) - Plan: PT plan of care cert/re-cert  Difficulty in walking, not elsewhere classified - Plan: PT plan of care cert/re-cert     Problem List Patient Active Problem List    Diagnosis Date Noted  . Absolute anemia 06/26/2015  . A-fib ([place]) 06/26/2015  . Hepatic cyst 06/26/2015  . BP (high blood pressure) 06/26/2015  . HLD (hyperlipidemia) 06/26/2015  . H/O: osteoarthritis 06/26/2015  . Arthritis, degenerative 06/26/2015  . Chronic kidney disease (CKD), stage III (moderate) ([facility]) 06/26/2015  . Osteopenia 06/26/2015  . Obstructive apnea 04/19/2015   Joneen Boers PT, DPT   04/28/2018, 6:29 PM  [facility] [address], [place], [address] Phone: [phone]   Fax:  [phone]  Name: SAMHITHA ROSEN MRN: [id number] Date of Birth: [birth year]

## 2018-05-03 ENCOUNTER — Ambulatory Visit: Payer: Medicare Other

## 2018-05-03 DIAGNOSIS — R2681 Unsteadiness on feet: Secondary | ICD-10-CM

## 2018-05-03 DIAGNOSIS — Z9181 History of falling: Secondary | ICD-10-CM | POA: Diagnosis not present

## 2018-05-03 DIAGNOSIS — R262 Difficulty in walking, not elsewhere classified: Secondary | ICD-10-CM

## 2018-05-03 DIAGNOSIS — M6281 Muscle weakness (generalized): Secondary | ICD-10-CM

## 2018-05-03 NOTE — Therapy (Signed)
[facility] [address], [place], [address] Phone: [phone]   Fax:  [phone]  Physical Therapy Treatment  Patient Details  Name: Jaclyn Galvan MRN: [id number] Date of Birth: 1940/11/16 Referring Provider: Tama High, MD   Encounter Date: 05/03/2018  PT End of Session - 05/03/18 1037    Visit Number  2    Number of Visits  17    Date for PT Re-Evaluation  06/24/18    Authorization Type  2    Authorization Time Period  of 10 progress report    PT Start Time  1037    PT Stop Time  1117    PT Time Calculation (min)  40 min    Equipment Utilized During Treatment  Gait belt Pt SPC    Activity Tolerance  Patient tolerated treatment well    Behavior During Therapy  [facility] for tasks assessed/performed       Past Medical History:  Diagnosis Date  . Anemia    resolved from the beginning of this year  . Atrial fibrillation [facility])    cardioversion March 2004  . Cholelithiasis   . Chronic gastritis   . CKD (chronic kidney disease)    chronic kidney disease stage III  . Diverticulosis   . 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 DJD (degenerative joint disease)   . Elbow injury   . Fibromyalgia   . HH (hiatus hernia)   . Hypercholesterolemia    controlled with meds  . Hypertension    controlled with meds, checks regularly  . Liver cyst   . Osteopenia   . PPD positive   . Sleep apnea     Past Surgical History:  Procedure Laterality Date  . ADENOIDECTOMY    . Adenomatous polyp of duodenum    . APPENDECTOMY    . BACK SURGERY    . BILATERAL OOPHORECTOMY Right   . BREAST BIOPSY Right    1988 negative  . BREAST BIOPSY Right 09/25/2015   stereo unknown  . BREAST EXCISIONAL BIOPSY Left    ? when  . CHOLECYSTECTOMY    . DILATION AND CURETTAGE OF UTERUS  1966  . ESOPHAGOGASTRODUODENOSCOPY (EGD) WITH PROPOFOL N/A 07/31/2015   Procedure:  ESOPHAGOGASTRODUODENOSCOPY (EGD) WITH PROPOFOL;  Surgeon: [name], MD;  Location: [facility] ENDOSCOPY;  Service: Endoscopy;  Laterality: N/A;  . ESOPHAGOGASTRODUODENOSCOPY (EGD) WITH PROPOFOL N/A 05/06/2016   Procedure: ESOPHAGOGASTRODUODENOSCOPY (EGD) WITH PROPOFOL;  Surgeon: [name], MD;  Location: [facility] ENDOSCOPY;  Service: Endoscopy;  Laterality: N/A;  . ESOPHAGOGASTRODUODENOSCOPY (EGD) WITH PROPOFOL N/A 10/30/2017   Procedure: ESOPHAGOGASTRODUODENOSCOPY (EGD) WITH PROPOFOL;  Surgeon: [name], MD;  [facility] ENDOSCOPY;  Service: Endoscopy;  Laterality: N/A;  . JOINT REPLACEMENT     total knee Dr. Sabra Heck with excessive postoperative bleeding  . RHIZOTOMY    . TONSILLECTOMY    . TOTAL KNEE ARTHROPLASTY Bilateral    2001,2006  . TUBAL LIGATION  1971    There were no vitals filed for this visit.  Subjective Assessment - 05/03/18 1039    Subjective  Balance is about the same. Did not get the ball yet for her home exercise. Did some knee bending exercises at home. No pain anywhere.     Pertinent History  Balance/leg weakness. Chronic issue. 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 Also had bilateral knee replacements and pt  was not able to get the bend back in her knees.  January 2018, pt fainted. Had tests which revealed pt had a stroke a while ago. Pt husband said that he thinks it did not affect anyrhing.  Pt has difficulty getting up from a chair without help.  Had a fall around May 2019 when pt was in the shower.  Pt was trying to get up from her shower chair and her foot slipped. Did not injure anything.  No other falls since then. Pt husband also states that pt's head gets ahead of herself, meaning what her brain tells her feet to do is not fast enough to react.   Pt states fear of falling but does not keep her from doing activities or getting out of her home.    Pt states not having back, hip, or knee pain currently. Occasional back pain  for the past 3 months.     Patient Stated Goals  Pt expresses desire to get better.     Currently in Pain?  No/denies                               PT Education - 05/03/18 1114    Education Details  ther-ex    Person(s) Educated  Patient    Methods  Explanation;Demonstration;Tactile cues;Verbal cues    Comprehension  Returned demonstration;Verbalized understanding      Objective  osteopenia  Decreased balance when on R LE weight bearing during toe taps onto step during Berg Balance testing   Therapeutic exercise  Seated R knee flexion PROM with PT 10x3  Then  AAROM 10x3 with PT assist to end range.    82 degrees seated R knee flexion AROM afterwards  Seated clamshells, hips less than 90 degrees flexion   Green band 10x  Blue band 10x, then 10x5 seconds, then 10x10 seconds.   Seated hip extension isometrics 10x5 seconds alternating each LE for 2 sets  Standing mini squats with blue band resisting hip abduction/ER and bilateral UE assist 10x3  Side stepping with blue band around distal thighs 5 ft to the R and 5 ft to the L with bilateral UE assist 5x2 to promote glute med muscle strengthening  Standing hip extension resisting blue band around distal thighs 10x2 each LE with bilateral UE assist to promote glute muscle use. Min to mod assist to decrease low back extension/anterior pelvic tilt.   Standing alternating toe taps onto treadmill platform with bilateral UE assist 10x3 each LE    Improved exercise technique, movement at target joints, use of target muscles after mod verbal, visual, tactile cues.    Worked on LE strengthening to promote ability to support herself when performing standing tasks.  Continued working on knee flexion ROM to improve ability to stand up from a chair such as in the shower with decreased difficulty and more safely to help decrease fall risk.       PT Short Term Goals - 04/28/18 1757      PT SHORT TERM  GOAL #1   Title  Patient will be independent with her HEP to promote ability to stand up from a chair, improve LE strength, and improve balance.     Time  3    Period  Weeks    Target Date  05/20/18        PT Long Term Goals - 04/28/18 1758      PT LONG TERM GOAL #  1   Title  Patient will improve bilateral knee flexion ROM by at least 12 degrees to promote ability to stand up from her shower chair with less difficulty to decrease fall risk     Baseline  Knee flexion AROM: 73 degrees R, 80 degrees L (04/28/2018)    Time  8    Period  Weeks    Status  New    Target Date  06/24/18      PT LONG TERM GOAL #2   Title  Patient will improve bilateral LE strength by at least 1/2 MMT grade to promote ability to perform standing tasks as well as decrease fall risk.      Time  8    Period  Weeks    Status  New    Target Date  06/24/18      PT LONG TERM GOAL #3   Title  Patient will improve her Berg Balance score by at least 6 points as a demonstration of improved balance.     Baseline  35/56 (<46 suggests increased fall risk; 04/28/2018)    Time  8    Period  Weeks    Status  New    Target Date  06/24/18      PT LONG TERM GOAL #4   Title  Patient will improve her FOTO score by at least 8 points as a demonstration of improved function.     Baseline  LE FOTO 49 (04/28/2018)    Time  8    Period  Weeks    Status  New    Target Date  06/24/18            Plan - 05/03/18 1122    Clinical Impression Statement  Worked on LE strengthening to promote ability to support herself when performing standing tasks.  Continued working on knee flexion ROM to improve ability to stand up from a chair such as in the shower with decreased difficulty and more safely to help decrease fall risk.     Rehab Potential  Fair    Clinical Impairments Affecting Rehab Potential  (-) age, chronicity of condition, weakness, limited bilateral knee flexion ROM, decreased balance; (+) good husband support    PT  Frequency  2x / week    PT Duration  8 weeks    PT Treatment/Interventions  Balance training;Neuromuscular re-education;Patient/family education;Manual techniques;Passive range of motion;Dry needling;Therapeutic activities;Therapeutic exercise;Gait training;Electrical Stimulation;Iontophoresis 4mg /ml Dexamethasone;Aquatic Therapy    PT Next Visit Plan  knee flexion ROM, glute and quadriceps strengthening, balance training, manual techniques, modalities PRN    Consulted and Agree with Plan of Care  Patient;Family member/caregiver    Family Member Consulted  husband       Patient will benefit from skilled therapeutic intervention in order to improve the following deficits and impairments:  Postural dysfunction, Improper body mechanics, Difficulty walking, Decreased strength, Decreased range of motion, Decreased balance  Visit Diagnosis: History of falling  Unsteadiness on feet  Muscle weakness (generalized)  Difficulty in walking, not elsewhere classified     Problem List Patient Active Problem List   Diagnosis Date Noted  . Absolute anemia 06/26/2015  . A-fib ([place]) 06/26/2015  . Hepatic cyst 06/26/2015  . BP (high blood pressure) 06/26/2015  . HLD (hyperlipidemia) 06/26/2015  . H/O: osteoarthritis 06/26/2015  . Arthritis, degenerative 06/26/2015  . Chronic kidney disease (CKD), stage III (moderate) ([place]) 06/26/2015  . Osteopenia 06/26/2015  . Obstructive apnea 04/19/2015   Joneen Boers PT, DPT  05/03/2018, 11:29 AM  [facility] [address] [place], [address] Phone: [phone]   Fax:  [phone]  Name: JASLYNE BEECK MRN: [id number] Date of Birth: August 21, 1941

## 2018-05-06 ENCOUNTER — Ambulatory Visit: Payer: Medicare Other | Attending: Internal Medicine

## 2018-05-06 DIAGNOSIS — M6281 Muscle weakness (generalized): Secondary | ICD-10-CM | POA: Insufficient documentation

## 2018-05-06 DIAGNOSIS — Z9181 History of falling: Secondary | ICD-10-CM

## 2018-05-06 DIAGNOSIS — R262 Difficulty in walking, not elsewhere classified: Secondary | ICD-10-CM | POA: Insufficient documentation

## 2018-05-06 DIAGNOSIS — R2681 Unsteadiness on feet: Secondary | ICD-10-CM | POA: Insufficient documentation

## 2018-05-06 NOTE — Patient Instructions (Addendum)
Medbridge Access Code: [email or web address]    Seated Hip Abduction with Resistance  Green and blue band 10x3 with 5 second holds daily   Seated Hip Adduction Isometrics with Ball  10x3 with 5 second holds (can use 2 folded pillows)

## 2018-05-06 NOTE — Therapy (Signed)
[facility] [address] [place], [address] Phone: [phone]   Fax:  [phone]  Physical Therapy Treatment  Patient Details  Name: Jaclyn Galvan MRN: [id number] Date of Birth: [birth year] Referring Provider: Tama High, MD   Encounter Date: 05/06/2018  PT End of Session - 05/06/18 0952    Visit Number  3    Number of Visits  17    Date for PT Re-Evaluation  06/24/18    Authorization Type  3    Authorization Time Period  of 10 progress report    PT Start Time  0952    PT Stop Time  1049    PT Time Calculation (min)  57 min    Equipment Utilized During Treatment  Gait belt Pt SPC    Activity Tolerance  Patient tolerated treatment well    Behavior During Therapy  [facility] for tasks assessed/performed       Past Medical History:  Diagnosis Date  . Anemia    resolved from the beginning of this year  . Atrial fibrillation [facility])    cardioversion March 2004  . Cholelithiasis   . Chronic gastritis   . CKD (chronic kidney disease)    chronic kidney disease stage III  . Diverticulosis   . 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 DJD (degenerative joint disease)   . Elbow injury   . Fibromyalgia   . HH (hiatus hernia)   . Hypercholesterolemia    controlled with meds  . Hypertension    controlled with meds, checks regularly  . Liver cyst   . Osteopenia   . PPD positive   . Sleep apnea     Past Surgical History:  Procedure Laterality Date  . ADENOIDECTOMY    . Adenomatous polyp of duodenum    . APPENDECTOMY    . BACK SURGERY    . BILATERAL OOPHORECTOMY Right   . BREAST BIOPSY Right    1988 negative  . BREAST BIOPSY Right 09/25/2015   stereo unknown  . BREAST EXCISIONAL BIOPSY Left    ? when  . CHOLECYSTECTOMY    . DILATION AND CURETTAGE OF UTERUS  1966  . ESOPHAGOGASTRODUODENOSCOPY (EGD) WITH PROPOFOL N/A 07/31/2015   Procedure:  ESOPHAGOGASTRODUODENOSCOPY (EGD) WITH PROPOFOL;  Surgeon: [name], MD;  Location: [facility] ENDOSCOPY;  Service: Endoscopy;  Laterality: N/A;  . ESOPHAGOGASTRODUODENOSCOPY (EGD) WITH PROPOFOL N/A 05/06/2016   Procedure: ESOPHAGOGASTRODUODENOSCOPY (EGD) WITH PROPOFOL;  Surgeon: [name], MD;  Location: [facility] ENDOSCOPY;  Service: Endoscopy;  Laterality: N/A;  . ESOPHAGOGASTRODUODENOSCOPY (EGD) WITH PROPOFOL N/A 10/30/2017   Procedure: ESOPHAGOGASTRODUODENOSCOPY (EGD) WITH PROPOFOL;  Surgeon: [name], MD;  Location: [facility] ENDOSCOPY;  Service: Endoscopy;  Laterality: N/A;  . JOINT REPLACEMENT     total knee Dr. Sabra Heck with excessive postoperative bleeding  . RHIZOTOMY    . TONSILLECTOMY    . TOTAL KNEE ARTHROPLASTY Bilateral    2001,2006  . TUBAL LIGATION  1971    There were no vitals filed for this visit.  Subjective Assessment - 05/06/18 0954    Subjective  Knees are stiff and hips hurt. Knees feel tender    Pertinent History  Balance/leg weakness. Chronic issue. 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 Also had bilateral knee replacements and pt was not able to get the bend back in her knees.  January 2018, pt fainted. Had  tests which revealed pt had a stroke a while ago. Pt husband said that he thinks it did not affect anyrhing.  Pt has difficulty getting up from a chair without help.  Had a fall around May 2019 when pt was in the shower.  Pt was trying to get up from her shower chair and her foot slipped. Did not injure anything.  No other falls since then. Pt husband also states that pt's head gets ahead of herself, meaning what her brain tells her feet to do is not fast enough to react.   Pt states fear of falling but does not keep her from doing activities or getting out of her home.    Pt states not having back, hip, or knee pain currently. Occasional back pain for the past 3 months.     Patient Stated Goals  Pt expresses desire to get better.      Currently in Pain?  Other (Comment) no pain level provided                               PT Education - 05/06/18 1058    Education Details  ther-ex, HEP    Person(s) Educated  Patient    Methods  Explanation;Demonstration;Tactile cues;Verbal cues;Handout    Comprehension  Returned demonstration;Verbalized understanding       Objective  Medbridge Access Code: [email or web address]    osteopenia  Decreased balance when on R LE weight bearing during toe taps onto step during Berg Balance testing  80 degrees R knee flexion AROM at start of session     Therapeutic exercise  Seated R knee flexion PROM with PT 10x3             Then  AAROM 10x3 with PT assist to end range.               86 degrees seated R knee flexion AROM afterwards  Seated clamshells, hips less than 90 degrees flexion                        Blue band  10x10 seconds.   Then blue and green band 10x5 seconds for 3 sets  Seated hip adduction ball and glute squeeze 10x5 seconds for 3 sets  Forward wedding march 32 ft x 2 with use of SPC  and CGA. Max cues for proper technique and for improving base of support.   Standing mini squats with blue band resisting hip abduction/ER and bilateral UE assist 10x, 5x. Pt states low back discomfort in the squat position. Eases with rest.    Side stepping with blue band around distal thighs 5 ft to the R and 5 ft to the L with bilateral UE assist 10x to promote glute med muscle strengthening  Standing alternating toe taps onto treadmill platform 10x2 each LE with light touch assist  Nustep at end of session, seat 8, arms 8 for 5 minutes at level 1 (4 min unbilled). Cues for pacing.   Improved exercise technique, movement at target joints, use of target muscles after mod verbal, visual, tactile cues.    Continued working on bilateral hip strengthening to promote ability to support herself when performing standing tasks. Worked on standing  alternating toe taps to promote balance, especially during single leg stance phases of standing activities to help decrease fall risk. Pt tolerated session well without aggravation of symptoms.  PT Short Term Goals - 04/28/18 1757      PT SHORT TERM GOAL #1   Title  Patient will be independent with her HEP to promote ability to stand up from a chair, improve LE strength, and improve balance.     Time  3    Period  Weeks    Target Date  05/20/18        PT Long Term Goals - 04/28/18 1758      PT LONG TERM GOAL #1   Title  Patient will improve bilateral knee flexion ROM by at least 12 degrees to promote ability to stand up from her shower chair with less difficulty to decrease fall risk     Baseline  Knee flexion AROM: 73 degrees R, 80 degrees L (04/28/2018)    Time  8    Period  Weeks    Status  New    Target Date  06/24/18      PT LONG TERM GOAL #2   Title  Patient will improve bilateral LE strength by at least 1/2 MMT grade to promote ability to perform standing tasks as well as decrease fall risk.      Time  8    Period  Weeks    Status  New    Target Date  06/24/18      PT LONG TERM GOAL #3   Title  Patient will improve her Berg Balance score by at least 6 points as a demonstration of improved balance.     Baseline  35/56 (<46 suggests increased fall risk; 04/28/2018)    Time  8    Period  Weeks    Status  New    Target Date  06/24/18      PT LONG TERM GOAL #4   Title  Patient will improve her FOTO score by at least 8 points as a demonstration of improved function.     Baseline  LE FOTO 49 (04/28/2018)    Time  8    Period  Weeks    Status  New    Target Date  06/24/18            Plan - 05/06/18 1059    Clinical Impression Statement  Continued working on bilateral hip strengthening to promote ability to support herself when performing standing tasks. Worked on standing alternating toe taps to promote balance, especially during single leg stance phases  of standing activities to help decrease fall risk. Pt tolerated session well without aggravation of symptoms.     Rehab Potential  Fair    Clinical Impairments Affecting Rehab Potential  (-) age, chronicity of condition, weakness, limited bilateral knee flexion ROM, decreased balance; (+) good husband support    PT Frequency  2x / week    PT Duration  8 weeks    PT Treatment/Interventions  Balance training;Neuromuscular re-education;Patient/family education;Manual techniques;Passive range of motion;Dry needling;Therapeutic activities;Therapeutic exercise;Gait training;Electrical Stimulation;Iontophoresis 4mg /ml Dexamethasone;Aquatic Therapy    PT Next Visit Plan  knee flexion ROM, glute and quadriceps strengthening, balance training, manual techniques, modalities PRN    Consulted and Agree with Plan of Care  Patient;Family member/caregiver    Family Member Consulted  husband       Patient will benefit from skilled therapeutic intervention in order to improve the following deficits and impairments:  Postural dysfunction, Improper body mechanics, Difficulty walking, Decreased strength, Decreased range of motion, Decreased balance  Visit Diagnosis: History of falling  Unsteadiness on feet  Muscle weakness (generalized)  Difficulty in walking, not elsewhere classified     Problem List Patient Active Problem List   Diagnosis Date Noted  . Absolute anemia 06/26/2015  . A-fib ([place]) 06/26/2015  . Hepatic cyst 06/26/2015  . BP (high blood pressure) 06/26/2015  . HLD (hyperlipidemia) 06/26/2015  . H/O: osteoarthritis 06/26/2015  . Arthritis, degenerative 06/26/2015  . Chronic kidney disease (CKD), stage III (moderate) ([place]) 06/26/2015  . Osteopenia 06/26/2015  . Obstructive apnea 04/19/2015    Joneen Boers PT, DPT   05/06/2018, 11:02 AM  [facility] [address] [place], [address] Phone: [phone]   Fax:   [phone]  Name: Jaclyn STANKIEWICZ MRN: [id number] Date of Birth: March 14, 1941

## 2018-05-11 ENCOUNTER — Ambulatory Visit: Payer: Medicare Other

## 2018-05-11 DIAGNOSIS — R2681 Unsteadiness on feet: Secondary | ICD-10-CM

## 2018-05-11 DIAGNOSIS — R262 Difficulty in walking, not elsewhere classified: Secondary | ICD-10-CM

## 2018-05-11 DIAGNOSIS — Z9181 History of falling: Secondary | ICD-10-CM | POA: Diagnosis not present

## 2018-05-11 DIAGNOSIS — M6281 Muscle weakness (generalized): Secondary | ICD-10-CM

## 2018-05-11 NOTE — Therapy (Signed)
[facility] [address], [place], [address] Phone: [phone]   Fax:  [phone]  Physical Therapy Treatment  Patient Details  Name: Jaclyn Galvan MRN: [id number] Date of Birth: [birth year] Referring Provider: Tama High, MD   Encounter Date: 05/11/2018  PT End of Session - 05/11/18 1748    Visit Number  4    Number of Visits  17    Date for PT Re-Evaluation  06/24/18    Authorization Type  4    Authorization Time Period  of 10 progress report    PT Start Time  1748    PT Stop Time  1831    PT Time Calculation (min)  43 min    Equipment Utilized During Treatment  Gait belt Pt SPC    Activity Tolerance  Patient tolerated treatment well    Behavior During Therapy  [facility] for tasks assessed/performed       Past Medical History:  Diagnosis Date  . Anemia    resolved from the beginning of this year  . Atrial fibrillation [facility])    cardioversion March 2004  . Cholelithiasis   . Chronic gastritis   . CKD (chronic kidney disease)    chronic kidney disease stage III  . Diverticulosis   . 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 DJD (degenerative joint disease)   . Elbow injury   . Fibromyalgia   . HH (hiatus hernia)   . Hypercholesterolemia    controlled with meds  . Hypertension    controlled with meds, checks regularly  . Liver cyst   . Osteopenia   . PPD positive   . Sleep apnea     Past Surgical History:  Procedure Laterality Date  . ADENOIDECTOMY    . Adenomatous polyp of duodenum    . APPENDECTOMY    . BACK SURGERY    . BILATERAL OOPHORECTOMY Right   . BREAST BIOPSY Right    1988 negative  . BREAST BIOPSY Right 09/25/2015   stereo unknown  . BREAST EXCISIONAL BIOPSY Left    ? when  . CHOLECYSTECTOMY    . DILATION AND CURETTAGE OF UTERUS  1966  . ESOPHAGOGASTRODUODENOSCOPY (EGD) WITH PROPOFOL N/A 07/31/2015   Procedure:  ESOPHAGOGASTRODUODENOSCOPY (EGD) WITH PROPOFOL;  Surgeon: [name], MD;  Location: [facility] ENDOSCOPY;  Service: Endoscopy;  Laterality: N/A;  . ESOPHAGOGASTRODUODENOSCOPY (EGD) WITH PROPOFOL N/A 05/06/2016   Procedure: ESOPHAGOGASTRODUODENOSCOPY (EGD) WITH PROPOFOL;  Surgeon: [name], MD;  Location: [facility] ENDOSCOPY;  Service: Endoscopy;  Laterality: N/A;  . ESOPHAGOGASTRODUODENOSCOPY (EGD) WITH PROPOFOL N/A 10/30/2017   Procedure: ESOPHAGOGASTRODUODENOSCOPY (EGD) WITH PROPOFOL;  Surgeon: [name], MD;  Location: [facility] ENDOSCOPY;  Service: Endoscopy;  Laterality: N/A;  . JOINT REPLACEMENT     total knee Dr. Sabra Heck with excessive postoperative bleeding  . RHIZOTOMY    . TONSILLECTOMY    . TOTAL KNEE ARTHROPLASTY Bilateral    2001,2006  . TUBAL LIGATION  1971    There were no vitals filed for this visit.  Subjective Assessment - 05/11/18 1750    Subjective  Doing ok. No pain or discomfort. Has a difficult time getting up from a toilet seat    Pertinent History  Balance/leg weakness. Chronic issue. 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 Also had bilateral knee replacements and pt was not able to get the bend back in her  knees.  January 2018, pt fainted. Had tests which revealed pt had a stroke a while ago. Pt husband said that he thinks it did not affect anyrhing.  Pt has difficulty getting up from a chair without help.  Had a fall around May 2019 when pt was in the shower.  Pt was trying to get up from her shower chair and her foot slipped. Did not injure anything.  No other falls since then. Pt husband also states that pt's head gets ahead of herself, meaning what her brain tells her feet to do is not fast enough to react.   Pt states fear of falling but does not keep her from doing activities or getting out of her home.    Pt states not having back, hip, or knee pain currently. Occasional back pain for the past 3 months.     Patient Stated Goals   Pt expresses desire to get better.     Currently in Pain?  No/denies                              Objective  Medbridge Access Code: [email or web address]    osteopenia  Decreased balance when on R LE weight bearing during toe taps onto step during Berg Balance testing  84 degrees R knee flexion AROM at start of session     Therapeutic exercise  Sit <> stand from elevated mat table 10x, then 8x at 23 inches high, no UE assist  Seated R knee flexionPROM with PT 10x3 Then AAROM 10x3 with PT assist to end range.   87 degrees seated R knee flexion AROM afterwards  Seated clamshells, hips less than 90 degrees flexion (has not done HEP)             blue and green band 10x5 seconds for 3 sets  Seated hip adduction ball and glute squeeze 10x5 seconds for 3 sets   Forward wedding march 32 ft x 2 with use of SPC  and CGA. Max cues for proper technique and for improving base of support.   Side stepping with blue and green band around distal thighs 5 ft to the R and 5 ft to the L with bilateral UE assist 5x2 to promote glute med muscle strengthening    Improved exercise technique, movement at target joints, use of target muscles after mod verbal, visual, tactile cues.    Consistent improvement with R knee flexion AROM compared to previous sessions. Continued working on improving ability to perform R knee flexion to improve ability to place her center of gravity over her base of support when standing up from a seated position.  Continued working on quadriceps and hip strengthening to improve ability to perform standing tasks as well as balance.  Pt tolerated session well without aggravation of symptoms.      PT Education - 05/11/18 1806    Education Details  ther-ex    Person(s) Educated  Patient    Methods  Explanation;Demonstration;Tactile cues;Verbal cues    Comprehension  Returned demonstration;Verbalized understanding        PT Short Term Goals - 04/28/18 1757      PT SHORT TERM GOAL #1   Title  Patient will be independent with her HEP to promote ability to stand up from a chair, improve LE strength, and improve balance.     Time  3    Period  Weeks    Target Date  05/20/18  PT Long Term Goals - 04/28/18 1758      PT LONG TERM GOAL #1   Title  Patient will improve bilateral knee flexion ROM by at least 12 degrees to promote ability to stand up from her shower chair with less difficulty to decrease fall risk     Baseline  Knee flexion AROM: 73 degrees R, 80 degrees L (04/28/2018)    Time  8    Period  Weeks    Status  New    Target Date  06/24/18      PT LONG TERM GOAL #2   Title  Patient will improve bilateral LE strength by at least 1/2 MMT grade to promote ability to perform standing tasks as well as decrease fall risk.      Time  8    Period  Weeks    Status  New    Target Date  06/24/18      PT LONG TERM GOAL #3   Title  Patient will improve her Berg Balance score by at least 6 points as a demonstration of improved balance.     Baseline  35/56 (<46 suggests increased fall risk; 04/28/2018)    Time  8    Period  Weeks    Status  New    Target Date  06/24/18      PT LONG TERM GOAL #4   Title  Patient will improve her FOTO score by at least 8 points as a demonstration of improved function.     Baseline  LE FOTO 49 (04/28/2018)    Time  8    Period  Weeks    Status  New    Target Date  06/24/18            Plan - 05/11/18 1814    Clinical Impression Statement  Consistent improvement with R knee flexion AROM compared to previous sessions. Continued working on improving ability to perform R knee flexion to improve ability to place her center of gravity over her base of support when standing up from a seated position.  Continued working on quadriceps and hip strengthening to improve ability to perform standing tasks as well as balance.  Pt tolerated session well without  aggravation of symptoms.     Rehab Potential  Fair    Clinical Impairments Affecting Rehab Potential  (-) age, chronicity of condition, weakness, limited bilateral knee flexion ROM, decreased balance; (+) good husband support    PT Frequency  2x / week    PT Duration  8 weeks    PT Treatment/Interventions  Balance training;Neuromuscular re-education;Patient/family education;Manual techniques;Passive range of motion;Dry needling;Therapeutic activities;Therapeutic exercise;Gait training;Electrical Stimulation;Iontophoresis 4mg /ml Dexamethasone;Aquatic Therapy    PT Next Visit Plan  knee flexion ROM, glute and quadriceps strengthening, balance training, manual techniques, modalities PRN    Consulted and Agree with Plan of Care  Patient;Family member/caregiver    Family Member Consulted  husband       Patient will benefit from skilled therapeutic intervention in order to improve the following deficits and impairments:  Postural dysfunction, Improper body mechanics, Difficulty walking, Decreased strength, Decreased range of motion, Decreased balance  Visit Diagnosis: History of falling  Unsteadiness on feet  Muscle weakness (generalized)  Difficulty in walking, not elsewhere classified     Problem List Patient Active Problem List   Diagnosis Date Noted  . Absolute anemia 06/26/2015  . A-fib ([place]) 06/26/2015  . Hepatic cyst 06/26/2015  . BP (high blood pressure) 06/26/2015  . HLD (  hyperlipidemia) 06/26/2015  . H/O: osteoarthritis 06/26/2015  . Arthritis, degenerative 06/26/2015  . Chronic kidney disease (CKD), stage III (moderate) ([place]) 06/26/2015  . Osteopenia 06/26/2015  . Obstructive apnea 04/19/2015    Joneen Boers PT, DPT   05/11/2018, 6:42 PM  [facility] [address], [place], [address] Phone: [phone]   Fax:  [phone]  Name: Jaclyn Galvan MRN: [id number] Date of Birth: [birth year]

## 2018-05-13 ENCOUNTER — Ambulatory Visit
Admission: RE | Admit: 2018-05-13 | Discharge: 2018-05-13 | Disposition: A | Discharge status: Home / Self Care | Payer: Medicare Other | Source: Ambulatory Visit | Attending: Obstetrics and Gynecology | Admitting: Obstetrics and Gynecology

## 2018-05-13 ENCOUNTER — Ambulatory Visit: Payer: Medicare Other

## 2018-05-13 DIAGNOSIS — R262 Difficulty in walking, not elsewhere classified: Secondary | ICD-10-CM

## 2018-05-13 DIAGNOSIS — Z1231 Encounter for screening mammogram for malignant neoplasm of breast: Secondary | ICD-10-CM | POA: Insufficient documentation

## 2018-05-13 DIAGNOSIS — M6281 Muscle weakness (generalized): Secondary | ICD-10-CM

## 2018-05-13 DIAGNOSIS — Z9181 History of falling: Secondary | ICD-10-CM | POA: Diagnosis not present

## 2018-05-13 DIAGNOSIS — R2681 Unsteadiness on feet: Secondary | ICD-10-CM

## 2018-05-13 NOTE — Therapy (Signed)
[place] [facility] [address] [place], [address] Phone: [phone]   Fax:  [phone]  Physical Therapy Treatment  Patient Details  Name: Jaclyn Galvan MRN: [id number] Date of Birth: 1941-03-23 Referring Provider: Tama High, MD   Encounter Date: 05/13/2018  PT End of Session - 05/13/18 1036    Visit Number  5    Number of Visits  17    Date for PT Re-Evaluation  06/24/18    Authorization Type  5    Authorization Time Period  of 10 progress report    PT Start Time  1038    PT Stop Time  1125    PT Time Calculation (min)  47 min    Equipment Utilized During Treatment  Gait belt   Pt SPC   Activity Tolerance  Patient tolerated treatment well    Behavior During Therapy  WFL for tasks assessed/performed       Past Medical History:  Diagnosis Date  . Anemia    resolved from the beginning of this year  . Atrial fibrillation [facility])    cardioversion March 2004  . Cholelithiasis   . Chronic gastritis   . CKD (chronic kidney disease)    chronic kidney disease stage III  . Diverticulosis   . 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 DJD (degenerative joint disease)   . Elbow injury   . Fibromyalgia   . HH (hiatus hernia)   . Hypercholesterolemia    controlled with meds  . Hypertension    controlled with meds, checks regularly  . Liver cyst   . Osteopenia   . PPD positive   . Sleep apnea     Past Surgical History:  Procedure Laterality Date  . ADENOIDECTOMY    . Adenomatous polyp of duodenum    . APPENDECTOMY    . BACK SURGERY    . BILATERAL OOPHORECTOMY Right   . BREAST BIOPSY Right    1988 negative  . BREAST BIOPSY Right 09/25/2015   stereo,benign  . BREAST EXCISIONAL BIOPSY Left    ? when,benign  . CHOLECYSTECTOMY    . DILATION AND CURETTAGE OF UTERUS  1966  . ESOPHAGOGASTRODUODENOSCOPY (EGD) WITH PROPOFOL N/A 07/31/2015   Procedure:  ESOPHAGOGASTRODUODENOSCOPY (EGD) WITH PROPOFOL;  Surgeon: [name], MD;  Location: [facility] ENDOSCOPY;  Service: Endoscopy;  Laterality: N/A;  . ESOPHAGOGASTRODUODENOSCOPY (EGD) WITH PROPOFOL N/A 05/06/2016   Procedure: ESOPHAGOGASTRODUODENOSCOPY (EGD) WITH PROPOFOL;  Surgeon: [name], MD;  Location: [facility] ENDOSCOPY;  Service: Endoscopy;  Laterality: N/A;  . ESOPHAGOGASTRODUODENOSCOPY (EGD) WITH PROPOFOL N/A 10/30/2017   Procedure: ESOPHAGOGASTRODUODENOSCOPY (EGD) WITH PROPOFOL;  Surgeon: [name], MD;  Location: [facility] ENDOSCOPY;  Service: Endoscopy;  Laterality: N/A;  . JOINT REPLACEMENT     total knee Dr. Sabra Heck with excessive postoperative bleeding  . RHIZOTOMY    . TONSILLECTOMY    . TOTAL KNEE ARTHROPLASTY Bilateral    2001,2006  . TUBAL LIGATION  1971    There were no vitals filed for this visit.  Subjective Assessment - 05/13/18 1041    Subjective  R knee feels kind of stiff. No pain or discomfort anywhere.  Pt states that she has not been doing her HEP.     Pertinent History  Balance/leg weakness. Chronic issue. 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 Also had bilateral knee replacements and pt was not able  to get the bend back in her knees.  January 2018, pt fainted. Had tests which revealed pt had a stroke a while ago. Pt husband said that he thinks it did not affect anyrhing.  Pt has difficulty getting up from a chair without help.  Had a fall around May 2019 when pt was in the shower.  Pt was trying to get up from her shower chair and her foot slipped. Did not injure anything.  No other falls since then. Pt husband also states that pt's head gets ahead of herself, meaning what her brain tells her feet to do is not fast enough to react.   Pt states fear of falling but does not keep her from doing activities or getting out of her home.    Pt states not having back, hip, or knee pain currently. Occasional back pain for the past 3 months.      Patient Stated Goals  Pt expresses desire to get better.     Currently in Pain?  No/denies                               PT Education - 05/13/18 1120    Education Details  ther-ex    Person(s) Educated  Patient    Methods  Explanation;Demonstration;Tactile cues;Verbal cues    Comprehension  Returned demonstration;Verbalized understanding       Objective  MedbridgeAccess Code: [email or web address]   osteopenia  Decreased balance when on R LE weight bearing during toe taps onto step during Berg Balance testing  85 degrees R knee flexion AROM at start of session    Therapeutic exercise  Sit <> stand from elevated mat table 10x at 23 inches high, no UE assist  Then 10x, then 2x at 22 inches high  Cues for evening out weight distribution on feet and not just on her heels to promote balance   Seated R knee flexionPROM with PT 10x3 Then AAROM 10x3 with PT assist to end range.    85degrees seated R knee flexion AROM afterwards  Seated clamshells, hips less than 90 degrees flexion (has not done HEP)   blue and green band 10x5 seconds for 3 sets to promote glute med strengthening  Seated hip adduction ball and glute max squeeze 10x5 seconds   Forward wedding march 32 ft x 2 with use of SPC and CGA. Max cues for proper technique and for improving base of support.   Four square step exercise to challenge balance and coordination with SPC 5x      Improved exercise technique, movement at target joints, use of target muscles after mod verbal, visual, tactile cues.    Manual therapy  Seated STM to R vastus lateralis to help promote knee flexion ROM.   Reviewed and given as part of her HEP. Pt demonstrated and verbalized understanding.      Continued working on promoting R knee flexion ROM to decrease difficulty with sit <> stand transfers. Continued working on bilateral LE strength (glute med and  quadriceps) to promote balance and ability to support herself with her LE when performing standing tasks. Cues needed to maintain center of gravity over her base of support (feet) to promote balance and decrease posterior lean with activities such as sit <> stand exercise. Pt tolerated session well without aggravation of symptoms.        PT Short Term Goals - 04/28/18 1757      PT SHORT TERM  GOAL #1   Title  Patient will be independent with her HEP to promote ability to stand up from a chair, improve LE strength, and improve balance.     Time  3    Period  Weeks    Target Date  05/20/18         PT Galvan Term Goals - 04/28/18 1758      PT Galvan TERM GOAL #1   Title  Patient will improve bilateral knee flexion ROM by at least 12 degrees to promote ability to stand up from her shower chair with less difficulty to decrease fall risk     Baseline  Knee flexion AROM: 73 degrees R, 80 degrees L (04/28/2018)    Time  8    Period  Weeks    Status  New    Target Date  06/24/18      PT Galvan TERM GOAL #2   Title  Patient will improve bilateral LE strength by at least 1/2 MMT grade to promote ability to perform standing tasks as well as decrease fall risk.      Time  8    Period  Weeks    Status  New    Target Date  06/24/18      PT Galvan TERM GOAL #3   Title  Patient will improve her Berg Balance score by at least 6 points as a demonstration of improved balance.     Baseline  35/56 (<46 suggests increased fall risk; 04/28/2018)    Time  8    Period  Weeks    Status  New    Target Date  06/24/18      PT Galvan TERM GOAL #4   Title  Patient will improve her FOTO score by at least 8 points as a demonstration of improved function.     Baseline  LE FOTO 49 (04/28/2018)    Time  8    Period  Weeks    Status  New    Target Date  06/24/18            Plan - 05/13/18 1147    Clinical Impression Statement  Continued working on promoting R knee flexion ROM to decrease difficulty with sit  <> stand transfers. Continued working on bilateral LE strength (glute med and quadriceps) to promote balance and ability to support herself with her LE when performing standing tasks. Cues needed to maintain center of gravity over her base of support (feet) to promote balance and decrease posterior lean with activities such as sit <> stand exercise. Pt tolerated session well without aggravation of symptoms.     Rehab Potential  Fair    Clinical Impairments Affecting Rehab Potential  (-) age, chronicity of condition, weakness, limited bilateral knee flexion ROM, decreased balance; (+) good husband support    PT Frequency  2x / week    PT Duration  8 weeks    PT Treatment/Interventions  Balance training;Neuromuscular re-education;Patient/family education;Manual techniques;Passive range of motion;Dry needling;Therapeutic activities;Therapeutic exercise;Gait training;Electrical Stimulation;Iontophoresis 4mg /ml Dexamethasone;Aquatic Therapy    PT Next Visit Plan  knee flexion ROM, glute and quadriceps strengthening, balance training, manual techniques, modalities PRN    Consulted and Agree with Plan of Care  Patient;Family member/caregiver    Family Member Consulted  husband       Patient will benefit from skilled therapeutic intervention in order to improve the following deficits and impairments:  Postural dysfunction, Improper body mechanics, Difficulty walking, Decreased strength, Decreased range of motion,  Decreased balance  Visit Diagnosis: History of falling  Unsteadiness on feet  Muscle weakness (generalized)  Difficulty in walking, not elsewhere classified     Problem List Patient Active Problem List   Diagnosis Date Noted  . Absolute anemia 06/26/2015  . A-fib ([place]) 06/26/2015  . Hepatic cyst 06/26/2015  . BP (high blood pressure) 06/26/2015  . HLD (hyperlipidemia) 06/26/2015  . H/O: osteoarthritis 06/26/2015  . Arthritis, degenerative 06/26/2015  . Chronic kidney disease  (CKD), stage III (moderate) ([place]) 06/26/2015  . Osteopenia 06/26/2015  . Obstructive apnea 04/19/2015    Joneen Boers PT, DPT   05/13/2018, 4:55 PM  [place] [facility] [address] [place], [address] Phone: [phone]   Fax:  [phone]  Name: Jaclyn Galvan MRN: [id number] Date of Birth: [birth year]

## 2018-05-13 NOTE — Patient Instructions (Signed)
Seated STM to R vastus lateralis to help promote knee flexion ROM.   Reviewed and given as part of her HEP. Pt demonstrated and verbalized understanding.

## 2018-05-14 ENCOUNTER — Other Ambulatory Visit: Payer: Self-pay | Admitting: Obstetrics and Gynecology

## 2018-05-14 DIAGNOSIS — N6310 Unspecified lump in the right breast, unspecified quadrant: Secondary | ICD-10-CM

## 2018-05-14 DIAGNOSIS — R928 Other abnormal and inconclusive findings on diagnostic imaging of breast: Secondary | ICD-10-CM

## 2018-05-18 ENCOUNTER — Ambulatory Visit: Payer: Medicare Other

## 2018-05-18 DIAGNOSIS — R2681 Unsteadiness on feet: Secondary | ICD-10-CM

## 2018-05-18 DIAGNOSIS — R262 Difficulty in walking, not elsewhere classified: Secondary | ICD-10-CM

## 2018-05-18 DIAGNOSIS — M6281 Muscle weakness (generalized): Secondary | ICD-10-CM

## 2018-05-18 DIAGNOSIS — Z9181 History of falling: Secondary | ICD-10-CM

## 2018-05-18 NOTE — Therapy (Signed)
[place] [facility] [address], [place], [address] Phone: [phone]   Fax:  [phone]  Physical Therapy Treatment  Patient Details  Name: Jaclyn Galvan MRN: [id number] Date of Birth: 1941/07/19 Referring Provider: Tama High, MD   Encounter Date: 05/18/2018  PT End of Session - 05/18/18 1605    Visit Number  6    Number of Visits  17    Date for PT Re-Evaluation  06/24/18    Authorization Type  6    Authorization Time Period  of 10 progress report    PT Start Time  1605    PT Stop Time  1645    PT Time Calculation (min)  40 min    Equipment Utilized During Treatment  Gait belt   Pt SPC   Activity Tolerance  Patient tolerated treatment well    Behavior During Therapy  [facility] for tasks assessed/performed       Past Medical History:  Diagnosis Date  . Anemia    resolved from the beginning of this year  . Atrial fibrillation [facility])    cardioversion March 2004  . Cholelithiasis   . Chronic gastritis   . CKD (chronic kidney disease)    chronic kidney disease stage III  . Diverticulosis   . 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 DJD (degenerative joint disease)   . Elbow injury   . Fibromyalgia   . HH (hiatus hernia)   . Hypercholesterolemia    controlled with meds  . Hypertension    controlled with meds, checks regularly  . Liver cyst   . Osteopenia   . PPD positive   . Sleep apnea     Past Surgical History:  Procedure Laterality Date  . ADENOIDECTOMY    . Adenomatous polyp of duodenum    . APPENDECTOMY    . BACK SURGERY    . BILATERAL OOPHORECTOMY Right   . BREAST BIOPSY Right    1988 negative  . BREAST BIOPSY Right 09/25/2015   stereo,benign  . BREAST EXCISIONAL BIOPSY Left    ? when,benign  . CHOLECYSTECTOMY    . DILATION AND CURETTAGE OF UTERUS  1966  . ESOPHAGOGASTRODUODENOSCOPY (EGD) WITH PROPOFOL N/A 07/31/2015   Procedure:  ESOPHAGOGASTRODUODENOSCOPY (EGD) WITH PROPOFOL;  Surgeon: [name], MD;  Location: [facility] ENDOSCOPY;  Service: Endoscopy;  Laterality: N/A;  . ESOPHAGOGASTRODUODENOSCOPY (EGD) WITH PROPOFOL N/A 05/06/2016   Procedure: ESOPHAGOGASTRODUODENOSCOPY (EGD) WITH PROPOFOL;  Surgeon: [name], MD;  Location: [facility] ENDOSCOPY;  Service: Endoscopy;  Laterality: N/A;  . ESOPHAGOGASTRODUODENOSCOPY (EGD) WITH PROPOFOL N/A 10/30/2017   Procedure: ESOPHAGOGASTRODUODENOSCOPY (EGD) WITH PROPOFOL;  Surgeon: [name], MD;  Location: [facility] ENDOSCOPY;  Service: Endoscopy;  Laterality: N/A;  . JOINT REPLACEMENT     total knee Dr. Sabra Heck with excessive postoperative bleeding  . RHIZOTOMY    . TONSILLECTOMY    . TOTAL KNEE ARTHROPLASTY Bilateral    2001,2006  . TUBAL LIGATION  1971    There were no vitals filed for this visit.  Subjective Assessment - 05/18/18 1607    Subjective  R knee is kind of tight. No pain or discomfort anywhere. Getting up from a chair is better.     Pertinent History  Balance/leg weakness. Chronic issue. 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 Also had bilateral knee replacements and pt was not able to get the bend  back in her knees.  January 2018, pt fainted. Had tests which revealed pt had a stroke a while ago. Pt husband said that he thinks it did not affect anyrhing.  Pt has difficulty getting up from a chair without help.  Had a fall around May 2019 when pt was in the shower.  Pt was trying to get up from her shower chair and her foot slipped. Did not injure anything.  No other falls since then. Pt husband also states that pt's head gets ahead of herself, meaning what her brain tells her feet to do is not fast enough to react.   Pt states fear of falling but does not keep her from doing activities or getting out of her home.    Pt states not having back, hip, or knee pain currently. Occasional back pain for the past 3 months.     Patient  Stated Goals  Pt expresses desire to get better.     Currently in Pain?  No/denies                               PT Education - 05/18/18 1624    Education Details  ther-ex    Person(s) Educated  Patient    Methods  Explanation;Demonstration;Tactile cues;Verbal cues    Comprehension  Returned demonstration;Verbalized understanding      Objective  MedbridgeAccess Code: [email or web address]   osteopenia  Decreased balance when on R LE weight bearing during toe taps onto step during Berg Balance testing  84degrees R knee flexion AROM at start of session    Therapeutic exercise   Seated R knee flexionPROM with PT 10x3 Then AAROM 10x3 with PT assist to end range.   85 degrees R knee flexion AROM afterwards  Sit <> stand from elevated mat table 10x2 at 22 inches high, Bilateral UE assist to stand, no UE assist to sit. LOB x 2 with backward lean when trying to stand without UE assist at times, min to mod A to recover.   Cues for evening out weight distribution on feet and not just on her heels to promote balance, secondary to tendency for backward lean.   Seated clamshells, hips less than 90 degrees flexion(has not done HEP) blue and green band 10x5 seconds for 3 sets to promote glute med strengthening  Seated hip adduction ball and glute max squeeze 10x5 seconds    Standing 360 degree turns without UE assist CGA 3x each direction  Standing alternating toe taps onto treadmill platform 10x each LE with light touch assist  Then without UE assist, CGA, 10x   Then without UE assist, CGA onto stair step 10x each LE  Four square step exercise to challenge balance and coordination with SPC 5x (occasional use of SPC with first 2 repetitions, unsteady, LOB x 1 with min A to recover when not using SPC).     Improved exercise technique, movement at target joints, use of target muscles after mod verbal, visual, tactile cues.     Pt demonstrates tendency for backward lean when pt feels unsteady and loses balance at times, needing min to mod A to recover. Pt however demonstrates improved ability to perform alternating toe taps onto a platform as high as a regular step without UE assist which is an improvement compared to when she performed similar activity during her eval Merrilee Jansky Balance test). Continued working on LE strengthening and coordination to promote ability to perform standing tasks  and improve balance. Continued working on R knee flexion ROM as well to decrease difficulty standing up from a chair. Improving ability to perform sit <> stand transfers overall based on pt subjective reports and clinical observation.         PT Short Term Goals - 04/28/18 1757      PT SHORT TERM GOAL #1   Title  Patient will be independent with her HEP to promote ability to stand up from a chair, improve LE strength, and improve balance.     Time  3    Period  Weeks    Target Date  05/20/18        PT Long Term Goals - 04/28/18 1758      PT LONG TERM GOAL #1   Title  Patient will improve bilateral knee flexion ROM by at least 12 degrees to promote ability to stand up from her shower chair with less difficulty to decrease fall risk     Baseline  Knee flexion AROM: 73 degrees R, 80 degrees L (04/28/2018)    Time  8    Period  Weeks    Status  New    Target Date  06/24/18      PT LONG TERM GOAL #2   Title  Patient will improve bilateral LE strength by at least 1/2 MMT grade to promote ability to perform standing tasks as well as decrease fall risk.      Time  8    Period  Weeks    Status  New    Target Date  06/24/18      PT LONG TERM GOAL #3   Title  Patient will improve her Berg Balance score by at least 6 points as a demonstration of improved balance.     Baseline  35/56 (<46 suggests increased fall risk; 04/28/2018)    Time  8    Period  Weeks    Status  New    Target Date  06/24/18      PT LONG TERM GOAL  #4   Title  Patient will improve her FOTO score by at least 8 points as a demonstration of improved function.     Baseline  LE FOTO 49 (04/28/2018)    Time  8    Period  Weeks    Status  New    Target Date  06/24/18            Plan - 05/18/18 1625    Clinical Impression Statement  Pt demonstrates tendency for backward lean when pt feels unsteady and loses balance at times, needing min to mod A to recover. Pt however demonstrates improved ability to perform alternating toe taps onto a platform as high as a regular step without UE assist which is an improvement compared to when she performed similar activity during her eval Merrilee Jansky Balance test). Continued working on LE strengthening and coordination to promote ability to perform standing tasks and improve balance. Continued working on R knee flexion ROM as well to decrease difficulty standing up from a chair. Improving ability to perform sit <> stand transfers overall based on pt subjective reports and clinical observation.     Rehab Potential  Fair    Clinical Impairments Affecting Rehab Potential  (-) age, chronicity of condition, weakness, limited bilateral knee flexion ROM, decreased balance; (+) good husband support    PT Frequency  2x / week    PT Duration  8 weeks    PT Treatment/Interventions  Balance training;Neuromuscular re-education;Patient/family education;Manual techniques;Passive range of motion;Dry needling;Therapeutic activities;Therapeutic exercise;Gait training;Electrical Stimulation;Iontophoresis 4mg /ml Dexamethasone;Aquatic Therapy    PT Next Visit Plan  knee flexion ROM, glute and quadriceps strengthening, balance training, manual techniques, modalities PRN    Consulted and Agree with Plan of Care  Patient;Family member/caregiver    Family Member Consulted  husband       Patient will benefit from skilled therapeutic intervention in order to improve the following deficits and impairments:  Postural dysfunction, Improper  body mechanics, Difficulty walking, Decreased strength, Decreased range of motion, Decreased balance  Visit Diagnosis: History of falling  Unsteadiness on feet  Muscle weakness (generalized)  Difficulty in walking, not elsewhere classified     Problem List Patient Active Problem List   Diagnosis Date Noted  . Absolute anemia 06/26/2015  . A-fib ([place]) 06/26/2015  . Hepatic cyst 06/26/2015  . BP (high blood pressure) 06/26/2015  . HLD (hyperlipidemia) 06/26/2015  . H/O: osteoarthritis 06/26/2015  . Arthritis, degenerative 06/26/2015  . Chronic kidney disease (CKD), stage III (moderate) ([place]) 06/26/2015  . Osteopenia 06/26/2015  . Obstructive apnea 04/19/2015    Joneen Boers PT, DPT   05/18/2018, 5:02 PM  [place] [address] [place], [address] Phone: [phone]   Fax:  [phone]  Name: MONIC ENGELMANN MRN: [id number] Date of Birth: 11/02/1940

## 2018-05-20 ENCOUNTER — Ambulatory Visit: Payer: Medicare Other

## 2018-05-20 ENCOUNTER — Ambulatory Visit: Admission: RE | Admit: 2018-05-20 | Payer: Medicare Other | Source: Ambulatory Visit

## 2018-05-20 ENCOUNTER — Ambulatory Visit: Payer: Medicare Other | Attending: Obstetrics and Gynecology

## 2018-05-20 DIAGNOSIS — Z9181 History of falling: Secondary | ICD-10-CM | POA: Diagnosis not present

## 2018-05-20 DIAGNOSIS — M6281 Muscle weakness (generalized): Secondary | ICD-10-CM

## 2018-05-20 DIAGNOSIS — R2681 Unsteadiness on feet: Secondary | ICD-10-CM

## 2018-05-20 DIAGNOSIS — R262 Difficulty in walking, not elsewhere classified: Secondary | ICD-10-CM

## 2018-05-20 NOTE — Therapy (Signed)
[facility] [address] [place], [address] Phone: [phone]   Fax:  [phone]  Physical Therapy Treatment  Patient Details  Name: Jaclyn Galvan MRN: [id number] Date of Birth: 01/28/1941 Referring Provider: Tama High, MD   Encounter Date: 05/20/2018  PT End of Session - 05/20/18 1735    Visit Number  7    Number of Visits  17    Date for PT Re-Evaluation  06/24/18    Authorization Type  7    Authorization Time Period  of 10 progress report    PT Start Time  1735    PT Stop Time  1820    PT Time Calculation (min)  45 min    Equipment Utilized During Treatment  Gait belt   Pt SPC   Activity Tolerance  Patient tolerated treatment well    Behavior During Therapy  [facility] for tasks assessed/performed       Past Medical History:  Diagnosis Date  . Anemia    resolved from the beginning of this year  . Atrial fibrillation [facility])    cardioversion March 2004  . Cholelithiasis   . Chronic gastritis   . CKD (chronic kidney disease)    chronic kidney disease stage III  . Diverticulosis   . 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 DJD (degenerative joint disease)   . Elbow injury   . Fibromyalgia   . HH (hiatus hernia)   . Hypercholesterolemia    controlled with meds  . Hypertension    controlled with meds, checks regularly  . Liver cyst   . Osteopenia   . PPD positive   . Sleep apnea     Past Surgical History:  Procedure Laterality Date  . ADENOIDECTOMY    . Adenomatous polyp of duodenum    . APPENDECTOMY    . BACK SURGERY    . BILATERAL OOPHORECTOMY Right   . BREAST BIOPSY Right    1988 negative  . BREAST BIOPSY Right 09/25/2015   stereo,benign  . BREAST EXCISIONAL BIOPSY Left    ? when,benign  . CHOLECYSTECTOMY    . DILATION AND CURETTAGE OF UTERUS  1966  . ESOPHAGOGASTRODUODENOSCOPY (EGD) WITH PROPOFOL N/A 07/31/2015   Procedure:  ESOPHAGOGASTRODUODENOSCOPY (EGD) WITH PROPOFOL;  Surgeon: [name], MD;  Location: [facility] ENDOSCOPY;  Service: Endoscopy;  Laterality: N/A;  . ESOPHAGOGASTRODUODENOSCOPY (EGD) WITH PROPOFOL N/A 05/06/2016   Procedure: ESOPHAGOGASTRODUODENOSCOPY (EGD) WITH PROPOFOL;  Surgeon: [name], MD;  Location: [facility] ENDOSCOPY;  Service: Endoscopy;  Laterality: N/A;  . ESOPHAGOGASTRODUODENOSCOPY (EGD) WITH PROPOFOL N/A 10/30/2017   Procedure: ESOPHAGOGASTRODUODENOSCOPY (EGD) WITH PROPOFOL;  Surgeon: [name], MD;  Location: [facility] ENDOSCOPY;  Service: Endoscopy;  Laterality: N/A;  . JOINT REPLACEMENT     total knee Dr. Sabra Heck with excessive postoperative bleeding  . RHIZOTOMY    . TONSILLECTOMY    . TOTAL KNEE ARTHROPLASTY Bilateral    2001,2006  . TUBAL LIGATION  1971    There were no vitals filed for this visit.  Subjective Assessment - 05/20/18 1735    Subjective  No pain or discomfort. R knee feels better than last session.     Pertinent History  Balance/leg weakness. Chronic issue. 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 Also had bilateral knee replacements and pt was not able to get the bend back in her knees.  January 2018,  pt fainted. Had tests which revealed pt had a stroke a while ago. Pt husband said that he thinks it did not affect anyrhing.  Pt has difficulty getting up from a chair without help.  Had a fall around May 2019 when pt was in the shower.  Pt was trying to get up from her shower chair and her foot slipped. Did not injure anything.  No other falls since then. Pt husband also states that pt's head gets ahead of herself, meaning what her brain tells her feet to do is not fast enough to react.   Pt states fear of falling but does not keep her from doing activities or getting out of her home.    Pt states not having back, hip, or knee pain currently. Occasional back pain for the past 3 months.     Patient Stated Goals  Pt expresses desire  to get better.                                PT Education - 05/20/18 1740    Education Details  ther-ex    Person(s) Educated  Patient    Methods  Explanation;Demonstration;Tactile cues;Verbal cues    Comprehension  Returned demonstration;Verbalized understanding       Objective  MedbridgeAccess Code: [email or web address]   osteopenia   82degrees R knee flexion AROM at start of session   Therapeutic exercise   Seated R knee flexionPROM with PT 10x3 Then AAROM 10x3 with PT assist to end range  Then seated L knee flexion AROM 5x             90 degrees R knee flexion AROM afterwards   Sit <> stand from elevated mat table 10x2at 22 inches high, Bilateral UE assist to stand, no UE assist to sit.  Seated clamshells, hips less than 90 degrees flexion(has not done HEP) blue and green band 10x5 seconds for 3 setsto promote glute med strengthening   Seated hip adduction ball and glutemaxsqueeze 10x5 seconds   Standing 360 degree turns without UE assist CGA 3x each direction  Side stepping with blue and green band around distal thighs 5 ft to the R and 5 ft to the L with bilateral UE assist 7x to promote glute med muscle strengthening. Good glute med muscle use felt by pt.   Standing alternating toe taps onto 1st regular step 10x each LE with light touch assist, then 10x each LE without UE assist   Ascending and descending 4 regular steps with bilateral UE assist. Able to ascend with a reciprocal pattern but decreased femoral control. Descended steps with stepping down with R LE in which limited R knee flexion may play a factor.   Forward step onto and over 4 inch step with R LE and one UE assist 5x to promote ability to step down with L LE when negotiating steps    Improved exercise technique, movement at target joints, use of target muscles after mod verbal, visual, tactile cues.     Improved R knee flexion AROM  in sitting to 90 degrees today. Continued working on improving knee flexion ROM to help decrease difficulty with sit <> stand transfers and decrease fall risk such as when getting up from the shower chair. Continued working on LE strengthening to promote ability to support herself when performing standing tasks. Able to consistently perform alternating toe taps onto stair step without UE assist and no LOB. No  LOB when turning 360 degrees each direction slowly. Pt still demonstrates bilateral LE weakness, and difficulty performing functional tasks such as transfers, and difficulty with balance and would benefit from continued skilled physical therapy services to address the aforementioned deficits.         PT Short Term Goals - 04/28/18 1757      PT SHORT TERM GOAL #1   Title  Patient will be independent with her HEP to promote ability to stand up from a chair, improve LE strength, and improve balance.     Time  3    Period  Weeks    Target Date  05/20/18        PT Long Term Goals - 04/28/18 1758      PT LONG TERM GOAL #1   Title  Patient will improve bilateral knee flexion ROM by at least 12 degrees to promote ability to stand up from her shower chair with less difficulty to decrease fall risk     Baseline  Knee flexion AROM: 73 degrees R, 80 degrees L (04/28/2018)    Time  8    Period  Weeks    Status  New    Target Date  06/24/18      PT LONG TERM GOAL #2   Title  Patient will improve bilateral LE strength by at least 1/2 MMT grade to promote ability to perform standing tasks as well as decrease fall risk.      Time  8    Period  Weeks    Status  New    Target Date  06/24/18      PT LONG TERM GOAL #3   Title  Patient will improve her Berg Balance score by at least 6 points as a demonstration of improved balance.     Baseline  35/56 (<46 suggests increased fall risk; 04/28/2018)    Time  8    Period  Weeks    Status  New    Target Date  06/24/18      PT LONG TERM GOAL #4    Title  Patient will improve her FOTO score by at least 8 points as a demonstration of improved function.     Baseline  LE FOTO 49 (04/28/2018)    Time  8    Period  Weeks    Status  New    Target Date  06/24/18            Plan - 05/20/18 1754    Clinical Impression Statement  Improved R knee flexion AROM in sitting to 90 degrees today. Continued working on improving knee flexion ROM to help decrease difficulty with sit <> stand transfers and decrease fall risk such as when getting up from the shower chair. Continued working on LE strengthening to promote ability to support herself when performing standing tasks. Able to consistently perform alternating toe taps onto stair step without UE assist and no LOB. No LOB when turning 360 degrees each direction slowly. Pt still demonstrates bilateral LE weakness, and difficulty performing functional tasks such as transfers, and difficulty with balance and would benefit from continued skilled physical therapy services to address the aforementioned deficits.     Rehab Potential  Fair    Clinical Impairments Affecting Rehab Potential  (-) age, chronicity of condition, weakness, limited bilateral knee flexion ROM, decreased balance; (+) good husband support    PT Frequency  2x / week    PT Duration  8 weeks  PT Treatment/Interventions  Balance training;Neuromuscular re-education;Patient/family education;Manual techniques;Passive range of motion;Dry needling;Therapeutic activities;Therapeutic exercise;Gait training;Electrical Stimulation;Iontophoresis 4mg /ml Dexamethasone;Aquatic Therapy    PT Next Visit Plan  knee flexion ROM, glute and quadriceps strengthening, balance training, manual techniques, modalities PRN    Consulted and Agree with Plan of Care  Patient;Family member/caregiver    Family Member Consulted  husband       Patient will benefit from skilled therapeutic intervention in order to improve the following deficits and impairments:   Postural dysfunction, Improper body mechanics, Difficulty walking, Decreased strength, Decreased range of motion, Decreased balance  Visit Diagnosis: History of falling  Unsteadiness on feet  Muscle weakness (generalized)  Difficulty in walking, not elsewhere classified     Problem List Patient Active Problem List   Diagnosis Date Noted  . Absolute anemia 06/26/2015  . A-fib ([place]) 06/26/2015  . Hepatic cyst 06/26/2015  . BP (high blood pressure) 06/26/2015  . HLD (hyperlipidemia) 06/26/2015  . H/O: osteoarthritis 06/26/2015  . Arthritis, degenerative 06/26/2015  . Chronic kidney disease (CKD), stage III (moderate) ([place]) 06/26/2015  . Osteopenia 06/26/2015  . Obstructive apnea 04/19/2015    Joneen Boers PT, DPT   05/20/2018, 6:47 PM  [facility] [address] [place], [address] Phone: [phone]   Fax:  [phone]  Name: Jaclyn Galvan MRN: [id number] Date of Birth: [birth year]

## 2018-05-24 ENCOUNTER — Ambulatory Visit
Admission: RE | Admit: 2018-05-24 | Discharge: 2018-05-24 | Disposition: A | Discharge status: Home / Self Care | Payer: Medicare Other | Source: Ambulatory Visit | Attending: Obstetrics and Gynecology | Admitting: Obstetrics and Gynecology

## 2018-05-24 ENCOUNTER — Ambulatory Visit: Payer: Medicare Other

## 2018-05-24 ENCOUNTER — Other Ambulatory Visit: Payer: Medicare Other

## 2018-05-24 ENCOUNTER — Other Ambulatory Visit: Payer: Self-pay | Admitting: Internal Medicine

## 2018-05-24 DIAGNOSIS — N6310 Unspecified lump in the right breast, unspecified quadrant: Secondary | ICD-10-CM | POA: Diagnosis present

## 2018-05-24 DIAGNOSIS — R928 Other abnormal and inconclusive findings on diagnostic imaging of breast: Secondary | ICD-10-CM | POA: Insufficient documentation

## 2018-05-24 DIAGNOSIS — R1084 Generalized abdominal pain: Secondary | ICD-10-CM

## 2018-05-25 ENCOUNTER — Ambulatory Visit: Payer: Medicare Other

## 2018-05-25 ENCOUNTER — Other Ambulatory Visit: Payer: Medicare Other

## 2018-05-26 ENCOUNTER — Ambulatory Visit: Payer: Medicare Other

## 2018-05-26 DIAGNOSIS — R262 Difficulty in walking, not elsewhere classified: Secondary | ICD-10-CM

## 2018-05-26 DIAGNOSIS — R2681 Unsteadiness on feet: Secondary | ICD-10-CM

## 2018-05-26 DIAGNOSIS — Z9181 History of falling: Secondary | ICD-10-CM | POA: Diagnosis not present

## 2018-05-26 DIAGNOSIS — M6281 Muscle weakness (generalized): Secondary | ICD-10-CM

## 2018-05-26 NOTE — Therapy (Signed)
[facility] [address] [place], [address] Phone: [phone]   Fax:  [phone]  Physical Therapy Treatment  Patient Details  Name: Jaclyn Galvan MRN: [id number] Date of Birth: 01/23/1941 Referring Provider: Tama High, MD   Encounter Date: 05/26/2018  PT End of Session - 05/26/18 1721    Visit Number  8    Number of Visits  17    Date for PT Re-Evaluation  06/24/18    Authorization Type  8    Authorization Time Period  of 10 progress report    PT Start Time  1721    PT Stop Time  1809    PT Time Calculation (min)  48 min    Equipment Utilized During Treatment  Gait belt   Pt SPC   Activity Tolerance  Patient tolerated treatment well    Behavior During Therapy  [facility] for tasks assessed/performed       Past Medical History:  Diagnosis Date  . Anemia    resolved from the beginning of this year  . Atrial fibrillation [facility])    cardioversion March 2004  . Cholelithiasis   . Chronic gastritis   . CKD (chronic kidney disease)    chronic kidney disease stage III  . Diverticulosis   . 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 DJD (degenerative joint disease)   . Elbow injury   . Fibromyalgia   . HH (hiatus hernia)   . Hypercholesterolemia    controlled with meds  . Hypertension    controlled with meds, checks regularly  . Liver cyst   . Osteopenia   . PPD positive   . Sleep apnea     Past Surgical History:  Procedure Laterality Date  . ADENOIDECTOMY    . Adenomatous polyp of duodenum    . APPENDECTOMY    . BACK SURGERY    . BILATERAL OOPHORECTOMY Right   . BREAST BIOPSY Right    1988 negative  . BREAST BIOPSY Right 09/25/2015   stereo,benign  . BREAST EXCISIONAL BIOPSY Left    ? when,benign  . CHOLECYSTECTOMY    . DILATION AND CURETTAGE OF UTERUS  1966  . ESOPHAGOGASTRODUODENOSCOPY (EGD) WITH PROPOFOL N/A 07/31/2015   Procedure:  ESOPHAGOGASTRODUODENOSCOPY (EGD) WITH PROPOFOL;  Surgeon: [name], MD;  Location: [facility] ENDOSCOPY;  Service: Endoscopy;  Laterality: N/A;  . ESOPHAGOGASTRODUODENOSCOPY (EGD) WITH PROPOFOL N/A 05/06/2016   Procedure: ESOPHAGOGASTRODUODENOSCOPY (EGD) WITH PROPOFOL;  Surgeon: [name], MD;  Location: [facility] ENDOSCOPY;  Service: Endoscopy;  Laterality: N/A;  . ESOPHAGOGASTRODUODENOSCOPY (EGD) WITH PROPOFOL N/A 10/30/2017   Procedure: ESOPHAGOGASTRODUODENOSCOPY (EGD) WITH PROPOFOL;  Surgeon: [name], MD;  Location: [facility] ENDOSCOPY;  Service: Endoscopy;  Laterality: N/A;  . JOINT REPLACEMENT     total knee Dr. Sabra Heck with excessive postoperative bleeding  . RHIZOTOMY    . TONSILLECTOMY    . TOTAL KNEE ARTHROPLASTY Bilateral    2001,2006  . TUBAL LIGATION  1971    There were no vitals filed for this visit.  Subjective Assessment - 05/26/18 1724    Subjective  No pain or discomfort. Daughter noticed that she can stand up from a chair easier.     Pertinent History  Balance/leg weakness. Chronic issue. 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 Also had bilateral knee replacements and pt was not able to get the bend back in her  knees.  January 2018, pt fainted. Had tests which revealed pt had a stroke a while ago. Pt husband said that he thinks it did not affect anyrhing.  Pt has difficulty getting up from a chair without help.  Had a fall around May 2019 when pt was in the shower.  Pt was trying to get up from her shower chair and her foot slipped. Did not injure anything.  No other falls since then. Pt husband also states that pt's head gets ahead of herself, meaning what her brain tells her feet to do is not fast enough to react.   Pt states fear of falling but does not keep her from doing activities or getting out of her home.    Pt states not having back, hip, or knee pain currently. Occasional back pain for the past 3 months.     Patient Stated Goals   Pt expresses desire to get better.     Currently in Pain?  No/denies                              Objective  MedbridgeAccess Code: [email or web address]   osteopenia   90degrees R knee flexion AROM at start of session   Therapeutic exercise   Seated R knee flexionPROM with PT 10x3 Then AAROM 10x3 with PT assist to end range             Then seated L knee flexion AROM 5x able to achieve 90 degrees R knee flexion AROM 5 x consistently   Sit <> stand from elevated mat table 10xat 22inches high, Bilateral UE assist to stand,no UE assistto sit.  Then from chair with arms and pillow. B UE assist to stand, no UE assist to sit 5x2. Pillow behind head since chair is against the wall.   Reviewed and given as part of her HEP. Pt demonstrated and verbalized understanding 2x5 daily.   Improving ability to perform sit <> stand from chair with arms observed.   Seated clamshells, hips less than 90 degrees flexion(has not done HEP) blue and green band 10x5 seconds for 3 setsto promote glute med strengthening  Seated hip adduction ball and glutemaxsqueeze 10x5 seconds   Standing 360 degree turns without UE assist CGA 3x each direction  4 square step exercise 5x with occasional use of SPC. CGA. Better able to perform without SPC assist at times.   Forward step onto and over 4 inch step with R LE and one UE assist 5x to promote ability to step down with L LE when negotiating steps    Improved exercise technique, movement at target joints, use of target muscles after min to mod verbal, visual, tactile cues.     Pt able to consistently perform R knee flexion AROM to 90 degrees today. Continued working on improving R knee flexion to decrease difficulty with standing up from the shower chair to help decrease fall risk. Continued working on bilateral LE strengthening and standing balance related activities to help decrease  fall risk. Improving ability to stand up from a chair with arms observed.        PT Education - 05/26/18 1732    Education Details  ther-ex    Person(s) Educated  Patient    Methods  Explanation;Demonstration;Tactile cues;Verbal cues    Comprehension  Returned demonstration;Verbalized understanding       PT Short Term Goals - 04/28/18 1757      PT SHORT TERM  GOAL #1   Title  Patient will be independent with her HEP to promote ability to stand up from a chair, improve LE strength, and improve balance.     Time  3    Period  Weeks    Target Date  05/20/18        PT Long Term Goals - 04/28/18 1758      PT LONG TERM GOAL #1   Title  Patient will improve bilateral knee flexion ROM by at least 12 degrees to promote ability to stand up from her shower chair with less difficulty to decrease fall risk     Baseline  Knee flexion AROM: 73 degrees R, 80 degrees L (04/28/2018)    Time  8    Period  Weeks    Status  New    Target Date  06/24/18      PT LONG TERM GOAL #2   Title  Patient will improve bilateral LE strength by at least 1/2 MMT grade to promote ability to perform standing tasks as well as decrease fall risk.      Time  8    Period  Weeks    Status  New    Target Date  06/24/18      PT LONG TERM GOAL #3   Title  Patient will improve her Berg Balance score by at least 6 points as a demonstration of improved balance.     Baseline  35/56 (<46 suggests increased fall risk; 04/28/2018)    Time  8    Period  Weeks    Status  New    Target Date  06/24/18      PT LONG TERM GOAL #4   Title  Patient will improve her FOTO score by at least 8 points as a demonstration of improved function.     Baseline  LE FOTO 49 (04/28/2018)    Time  8    Period  Weeks    Status  New    Target Date  06/24/18            Plan - 05/26/18 1752    Clinical Impression Statement  Pt able to consistently perform R knee flexion AROM to 90 degrees today. Continued working on improving R  knee flexion to decrease difficulty with standing up from the shower chair to help decrease fall risk. Continued working on bilateral LE strengthening and standing balance related activities to help decrease fall risk. Improving ability to stand up from a chair with arms observed.     Rehab Potential  Fair    Clinical Impairments Affecting Rehab Potential  (-) age, chronicity of condition, weakness, limited bilateral knee flexion ROM, decreased balance; (+) good husband support    PT Frequency  2x / week    PT Duration  8 weeks    PT Treatment/Interventions  Balance training;Neuromuscular re-education;Patient/family education;Manual techniques;Passive range of motion;Dry needling;Therapeutic activities;Therapeutic exercise;Gait training;Electrical Stimulation;Iontophoresis 4mg /ml Dexamethasone;Aquatic Therapy    PT Next Visit Plan  knee flexion ROM, glute and quadriceps strengthening, balance training, manual techniques, modalities PRN    Consulted and Agree with Plan of Care  Patient;Family member/caregiver    Family Member Consulted  husband       Patient will benefit from skilled therapeutic intervention in order to improve the following deficits and impairments:  Postural dysfunction, Improper body mechanics, Difficulty walking, Decreased strength, Decreased range of motion, Decreased balance  Visit Diagnosis: History of falling  Unsteadiness on feet  Muscle weakness (generalized)  Difficulty in walking, not elsewhere classified     Problem List Patient Active Problem List   Diagnosis Date Noted  . Absolute anemia 06/26/2015  . A-fib ([place]) 06/26/2015  . Hepatic cyst 06/26/2015  . BP (high blood pressure) 06/26/2015  . HLD (hyperlipidemia) 06/26/2015  . H/O: osteoarthritis 06/26/2015  . Arthritis, degenerative 06/26/2015  . Chronic kidney disease (CKD), stage III (moderate) ([place]) 06/26/2015  . Osteopenia 06/26/2015  . Obstructive apnea 04/19/2015    Joneen Boers PT, DPT    05/26/2018, 6:37 PM  [facility] [address], [place], [address] Phone: [phone]   Fax:  [phone]  Name: BRYCE KIMBLE MRN: [id number] Date of Birth: 03-01-1941

## 2018-05-26 NOTE — Patient Instructions (Addendum)
Sit <> stand from chair with arms and pillow. B UE assist to stand, no UE assist to sit 5x2. Pillow behind head since chair is against the wall.   Reviewed and given as part of her HEP. Pt demonstrated and verbalized understanding

## 2018-05-27 ENCOUNTER — Other Ambulatory Visit
Admission: RE | Admit: 2018-05-27 | Discharge: 2018-05-27 | Disposition: A | Discharge status: Home / Self Care | Payer: Medicare Other | Source: Ambulatory Visit | Attending: Internal Medicine | Admitting: Internal Medicine

## 2018-05-27 ENCOUNTER — Ambulatory Visit
Admission: RE | Admit: 2018-05-27 | Discharge: 2018-05-27 | Disposition: A | Discharge status: Home / Self Care | Payer: Medicare Other | Source: Ambulatory Visit | Attending: Internal Medicine | Admitting: Internal Medicine

## 2018-05-27 ENCOUNTER — Ambulatory Visit: Payer: Medicare Other

## 2018-05-27 ENCOUNTER — Other Ambulatory Visit: Payer: Self-pay | Admitting: Internal Medicine

## 2018-05-27 DIAGNOSIS — I700 Atherosclerosis of aorta: Secondary | ICD-10-CM | POA: Diagnosis not present

## 2018-05-27 DIAGNOSIS — K5730 Diverticulosis of large intestine without perforation or abscess without bleeding: Secondary | ICD-10-CM | POA: Diagnosis not present

## 2018-05-27 DIAGNOSIS — K449 Diaphragmatic hernia without obstruction or gangrene: Secondary | ICD-10-CM | POA: Diagnosis not present

## 2018-05-27 DIAGNOSIS — R1084 Generalized abdominal pain: Secondary | ICD-10-CM | POA: Diagnosis present

## 2018-05-27 HISTORY — DX: Disorder of kidney and ureter, unspecified: N28.9

## 2018-05-27 LAB — CREATININE, SERUM
CREATININE: 1.17 mg/dL — AB (ref 0.44–1.00)
GFR calc Af Amer: 51 mL/min — ABNORMAL LOW (ref >60)
GFR calc non Af Amer: 44 mL/min — ABNORMAL LOW (ref >60)

## 2018-05-31 ENCOUNTER — Ambulatory Visit: Payer: Medicare Other

## 2018-05-31 DIAGNOSIS — R262 Difficulty in walking, not elsewhere classified: Secondary | ICD-10-CM

## 2018-05-31 DIAGNOSIS — R2681 Unsteadiness on feet: Secondary | ICD-10-CM

## 2018-05-31 DIAGNOSIS — Z9181 History of falling: Secondary | ICD-10-CM

## 2018-05-31 DIAGNOSIS — M6281 Muscle weakness (generalized): Secondary | ICD-10-CM

## 2018-05-31 NOTE — Therapy (Signed)
[facility] [address] [place], [address] Phone: [phone]   Fax:  [phone]  Physical Therapy Treatment And Progress Report   Patient Details  Name: Jaclyn Galvan MRN: [id number] Date of Birth: October 24, 1940 Referring Provider: Tama High, MD   Encounter Date: 05/31/2018  PT End of Session - 05/31/18 1304    Visit Number  9    Number of Visits  17    Date for PT Re-Evaluation  06/24/18    Authorization Type  9    Authorization Time Period  of 10 progress report    PT Start Time  1304    PT Stop Time  1353    PT Time Calculation (min)  49 min    Equipment Utilized During Treatment  Gait belt   Pt SPC   Activity Tolerance  Patient tolerated treatment well    Behavior During Therapy  [facility] for tasks assessed/performed       Past Medical History:  Diagnosis Date  . Anemia    resolved from the beginning of this year  . Atrial fibrillation [facility])    cardioversion March 2004  . Cholelithiasis   . Chronic gastritis   . CKD (chronic kidney disease)    chronic kidney disease stage III  . Diverticulosis   . 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 DJD (degenerative joint disease)   . Elbow injury   . Fibromyalgia   . HH (hiatus hernia)   . Hypercholesterolemia    controlled with meds  . Hypertension    controlled with meds, checks regularly  . Liver cyst   . Osteopenia   . PPD positive   . Renal insufficiency   . Sleep apnea     Past Surgical History:  Procedure Laterality Date  . ADENOIDECTOMY    . Adenomatous polyp of duodenum    . APPENDECTOMY    . BACK SURGERY    . BILATERAL OOPHORECTOMY Right   . BREAST BIOPSY Right    1988 negative  . BREAST BIOPSY Right 09/25/2015   stereo,benign  . BREAST EXCISIONAL BIOPSY Left    ? when,benign  . CHOLECYSTECTOMY    . DILATION AND CURETTAGE OF UTERUS  1966  . ESOPHAGOGASTRODUODENOSCOPY (EGD) WITH  PROPOFOL N/A 07/31/2015   Procedure: ESOPHAGOGASTRODUODENOSCOPY (EGD) WITH PROPOFOL;  Surgeon: [name], MD;  Location: [facility] ENDOSCOPY;  Service: Endoscopy;  Laterality: N/A;  . ESOPHAGOGASTRODUODENOSCOPY (EGD) WITH PROPOFOL N/A 05/06/2016   Procedure: ESOPHAGOGASTRODUODENOSCOPY (EGD) WITH PROPOFOL;  Surgeon: [name], MD;  Location: [facility] ENDOSCOPY;  Service: Endoscopy;  Laterality: N/A;  . ESOPHAGOGASTRODUODENOSCOPY (EGD) WITH PROPOFOL N/A 10/30/2017   Procedure: ESOPHAGOGASTRODUODENOSCOPY (EGD) WITH PROPOFOL;  Surgeon: [name], MD;  Location: [facility] ENDOSCOPY;  Service: Endoscopy;  Laterality: N/A;  . JOINT REPLACEMENT     total knee Dr. Sabra Heck with excessive postoperative bleeding  . RHIZOTOMY    . TONSILLECTOMY    . TOTAL KNEE ARTHROPLASTY Bilateral    2001,2006  . TUBAL LIGATION  1971    There were no vitals filed for this visit.  Subjective Assessment - 05/31/18 1306    Subjective  Doing ok. Lost her balance yesterday when she turned to the R quickly but caught her balance.     Pertinent History  Balance/leg weakness. Chronic issue. 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 Also had bilateral knee replacements and  pt was not able to get the bend back in her knees.  January 2018, pt fainted. Had tests which revealed pt had a stroke a while ago. Pt husband said that he thinks it did not affect anyrhing.  Pt has difficulty getting up from a chair without help.  Had a fall around May 2019 when pt was in the shower.  Pt was trying to get up from her shower chair and her foot slipped. Did not injure anything.  No other falls since then. Pt husband also states that pt's head gets ahead of herself, meaning what her brain tells her feet to do is not fast enough to react.   Pt states fear of falling but does not keep her from doing activities or getting out of her home.    Pt states not having back, hip, or knee pain currently. Occasional back pain  for the past 3 months.     Patient Stated Goals  Pt expresses desire to get better.     Currently in Pain?  No/denies         [facility] PT Assessment - 05/31/18 1309      AROM   Right Knee Flexion  90   92 degrees AAROM    Left Knee Flexion  91   95 degrees AAROM     Strength   Overall Strength Comments  Pillow used for padding    Right Hip Flexion  4/5    Right Hip Extension  4+/5   seated manually resisted hip extension isometrics   Right Hip ABduction  4/5   seated clamshell isometrics   Left Hip Flexion  4/5    Left Hip Extension  4+/5   seated manually resisted hip extension isometrics   Left Hip ABduction  4/5   seated clamshell isometrics   Right Knee Flexion  4+/5    Right Knee Extension  5/5    Left Knee Flexion  4+/5    Left Knee Extension  4+/5      Berg Balance Test   Sit to Stand  Able to stand  independently using hands    Standing Unsupported  Able to stand safely 2 minutes    Sitting with Back Unsupported but Feet Supported on Floor or Stool  Able to sit safely and securely 2 minutes    Stand to Sit  Sits safely with minimal use of hands    Transfers  Able to transfer safely, definite need of hands    Standing Unsupported with Eyes Closed  Able to stand 10 seconds safely    Standing Ubsupported with Feet Together  Able to place feet together independently and stand 1 minute safely    From Standing, Reach Forward with Outstretched Arm  Can reach forward >12 cm safely (5")    From Standing Position, Pick up Object from Floor  Able to pick up shoe safely and easily    From Standing Position, Turn to Look Behind Over each Shoulder  Looks behind one side only/other side shows less weight shift    Turn 360 Degrees  Able to turn 360 degrees safely but slowly    Standing Unsupported, Alternately Place Feet on Step/Stool  Able to stand independently and safely and complete 8 steps in 20 seconds    Standing Unsupported, One Foot in Front  Able to plae foot ahead of the  other independently and hold 30 seconds    Standing on One Leg  Unable to try or needs assist to  prevent fall    Total Score  45    Berg comment:  < 46/56 suggests increased risk for falls and need for AD use                           PT Education - 05/31/18 1307    Education Details  ther-ex    Person(s) Educated  Patient    Methods  Explanation;Demonstration;Verbal cues;Tactile cues    Comprehension  Returned demonstration;Verbalized understanding       Objective  MedbridgeAccess Code: [email or web address]   osteopenia    90degrees R knee flexion AROM at start of session   Therapeutic exercise   Seated R knee flexionPROM with PT 10x3 Then AAROM 10x3 with PT assist to end range Then seated L knee flexion AROM 10x able to achieve 90degrees R knee flexion AROM 10 x consistently  Seated manually resisted knee flexion, knee extension, hip flexion, hip extension, clamshell isometrics 1x each way for each LE  Reviewed progress/current status with strength   sit <> stand throughout session Stand pivot transfer chair <> low mat table Static standing shoulder width apart, then with eyes closed, then with eyes open feet together, tandem stance with feet shoulder width apart Picking up an object (computer mouse) from the floor,  Turning 360 degrees to the R and to the L 1x Looking behind to the R and to the L,  Standing forward reach,   Standing alternate toe taps 4 x each LE onto first regular step    Improved exercise technique, movement at target joints, use of target muscles after min to mod verbal, visual, tactile cues.     Pt demonstrates overall improved bilateral LE strength balance (improved Berg Balance score), ability to perform sit <> stand from the regular chair, improved bilateral knee flexion AROM, since initial evaluation. Pt still demonstrates bilateral LE weakness, difficulty with balance such  as turning around quickly, and difficulty performing standing tasks and would benefit from continued skilled physical therapy services to address the aforementioned deficits.           PT Short Term Goals - 05/31/18 1420      PT SHORT TERM GOAL #1   Title  Patient will be independent with her HEP to promote ability to stand up from a chair, improve LE strength, and improve balance.     Time  3    Period  Weeks    Status  On-going    Target Date  06/24/18        PT Long Term Goals - 05/31/18 1420      PT LONG TERM GOAL #1   Title  Patient will improve bilateral knee flexion ROM by at least 12 degrees to promote ability to stand up from her shower chair with less difficulty to decrease fall risk     Baseline  Knee flexion AROM: 73 degrees R, 80 degrees L (04/28/2018); 90 degrees R, 91 L (05/31/2018)    Time  8    Period  Weeks    Status  Partially Met    Target Date  06/24/18      PT LONG TERM GOAL #2   Title  Patient will improve bilateral LE strength by at least 1/2 MMT grade to promote ability to perform standing tasks as well as decrease fall risk.      Time  8    Period  Weeks    Status  Partially Met  Target Date  06/24/18      PT LONG TERM GOAL #3   Title  Patient will improve her Berg Balance score by at least 6 points as a demonstration of improved balance.     Baseline  35/56 (<46 suggests increased fall risk; 04/28/2018); 45/56 (05/31/2018)    Time  8    Period  Weeks    Status  Achieved    Target Date  06/24/18      PT LONG TERM GOAL #4   Title  Patient will improve her FOTO score by at least 8 points as a demonstration of improved function.     Baseline  LE FOTO 49 (04/28/2018); 53 (05/31/2018)    Time  8    Period  Weeks    Status  Partially Met    Target Date  06/24/18            Plan - 05/31/18 1333    Clinical Impression Statement  Pt demonstrates overall improved bilateral LE strength balance (improved Berg Balance score), ability to perform  sit <> stand from the regular chair, improved bilateral knee flexion AROM, since initial evaluation. Pt still demonstrates bilateral LE weakness, difficulty with balance such as turning around quickly, and difficulty performing standing tasks and would benefit from continued skilled physical therapy services to address the aforementioned deficits.      History and Personal Factors relevant to plan of care:  Chronicity of conditoin, limited bilateral knee flexion ROM, bilateral LE weakness, medical history, fall risk. Good husband support.     Clinical Presentation  Stable    Clinical Presentation due to:  Pt making progress with PT towards goals    Clinical Decision Making  Low    Rehab Potential  Fair    Clinical Impairments Affecting Rehab Potential  (-) age, chronicity of condition, weakness, limited bilateral knee flexion ROM, decreased balance; (+) good husband support    PT Frequency  2x / week    PT Duration  8 weeks    PT Treatment/Interventions  Balance training;Neuromuscular re-education;Patient/family education;Manual techniques;Passive range of motion;Dry needling;Therapeutic activities;Therapeutic exercise;Gait training;Electrical Stimulation;Iontophoresis 102m/ml Dexamethasone;Aquatic Therapy    PT Next Visit Plan  knee flexion ROM, glute and quadriceps strengthening, balance training, manual techniques, modalities PRN    Consulted and Agree with Plan of Care  Patient    Family Member Consulted  --       Patient will benefit from skilled therapeutic intervention in order to improve the following deficits and impairments:  Postural dysfunction, Improper body mechanics, Difficulty walking, Decreased strength, Decreased range of motion, Decreased balance  Visit Diagnosis: History of falling  Unsteadiness on feet  Muscle weakness (generalized)  Difficulty in walking, not elsewhere classified     Problem List Patient Active Problem List   Diagnosis Date Noted  . Absolute  anemia 06/26/2015  . [place] 06/26/[place]  . Hepatic cyst 06/26/2015  . BP (high blood pressure) 06/26/2015  . HLD (hyperlipidemia) 06/26/2015  . H/O: osteoarthritis 06/26/2015  . Arthritis, degenerative 06/26/2015  . Chronic kidney disease (CKD), stage III (moderate) ([place] 06/26/2015  . Osteopenia 06/26/2015  . Obstructive apnea 04/19/2015   Thank you for your referral.   MJoneen BoersPT, DPT   05/31/2018, 3:54 PM  [facility] [address] [place] [address]Phone: [phone]  Fax:  [phone] Name: EJAKYRIA BLEAUMRN: [id number] of Birth: [birth year]

## 2018-06-02 ENCOUNTER — Ambulatory Visit: Payer: Medicare Other

## 2018-06-08 ENCOUNTER — Ambulatory Visit: Payer: Medicare Other | Attending: Internal Medicine

## 2018-06-08 DIAGNOSIS — M6281 Muscle weakness (generalized): Secondary | ICD-10-CM | POA: Diagnosis present

## 2018-06-08 DIAGNOSIS — R262 Difficulty in walking, not elsewhere classified: Secondary | ICD-10-CM | POA: Diagnosis present

## 2018-06-08 DIAGNOSIS — Z9181 History of falling: Secondary | ICD-10-CM | POA: Diagnosis present

## 2018-06-08 DIAGNOSIS — R2681 Unsteadiness on feet: Secondary | ICD-10-CM

## 2018-06-08 NOTE — Therapy (Signed)
[facility] [address], [place], [address] Phone: [phone]   Fax:  [phone]  Physical Therapy Treatment  Patient Details  Name: Jaclyn Galvan MRN: [id number] Date of Birth: [birth year] Referring Provider: Tama High, MD   Encounter Date: 06/08/2018  PT End of Session - 06/08/18 1604    Visit Number  10    Number of Visits  17    Date for PT Re-Evaluation  06/24/18    Authorization Type  1    Authorization Time Period  of 10 progress report    PT Start Time  1605    PT Stop Time  1646    PT Time Calculation (min)  41 min    Equipment Utilized During Treatment  Gait belt   Pt SPC   Activity Tolerance  Patient tolerated treatment well    Behavior During Therapy  WFL for tasks assessed/performed       Past Medical History:  Diagnosis Date  . Anemia    resolved from the beginning of this year  . Atrial fibrillation [facility])    cardioversion March 2004  . Cholelithiasis   . Chronic gastritis   . CKD (chronic kidney disease)    chronic kidney disease stage III  . Diverticulosis   . 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 DJD (degenerative joint disease)   . Elbow injury   . Fibromyalgia   . HH (hiatus hernia)   . Hypercholesterolemia    controlled with meds  . Hypertension    controlled with meds, checks regularly  . Liver cyst   . Osteopenia   . PPD positive   . Renal insufficiency   . Sleep apnea     Past Surgical History:  Procedure Laterality Date  . ADENOIDECTOMY    . Adenomatous polyp of duodenum    . APPENDECTOMY    . BACK SURGERY    . BILATERAL OOPHORECTOMY Right   . BREAST BIOPSY Right    1988 negative  . BREAST BIOPSY Right 09/25/2015   stereo,benign  . BREAST EXCISIONAL BIOPSY Left    ? when,benign  . CHOLECYSTECTOMY    . DILATION AND CURETTAGE OF UTERUS  1966  . ESOPHAGOGASTRODUODENOSCOPY (EGD) WITH PROPOFOL N/A 07/31/2015    Procedure: ESOPHAGOGASTRODUODENOSCOPY (EGD) WITH PROPOFOL;  Surgeon: [name], MD;  Location: [facility] ENDOSCOPY;  Service: Endoscopy;  Laterality: N/A;  . ESOPHAGOGASTRODUODENOSCOPY (EGD) WITH PROPOFOL N/A 05/06/2016   Procedure: ESOPHAGOGASTRODUODENOSCOPY (EGD) WITH PROPOFOL;  Surgeon: [name], MD;  Location: [facility] ENDOSCOPY;  Service: Endoscopy;  Laterality: N/A;  . ESOPHAGOGASTRODUODENOSCOPY (EGD) WITH PROPOFOL N/A 10/30/2017   Procedure: ESOPHAGOGASTRODUODENOSCOPY (EGD) WITH PROPOFOL;  Surgeon: [name], MD;  Location: [facility] ENDOSCOPY;  Service: Endoscopy;  Laterality: N/A;  . JOINT REPLACEMENT     total knee Dr. Sabra Heck with excessive postoperative bleeding  . RHIZOTOMY    . TONSILLECTOMY    . TOTAL KNEE ARTHROPLASTY Bilateral    2001,2006  . TUBAL LIGATION  1971    There were no vitals filed for this visit.  Subjective Assessment - 06/08/18 1606    Subjective  Pt states that her daughter noticed that she is better able to stand up from sitting.  Balance feels better.  Has not been doing her HEP.     Pertinent History  Balance/leg weakness. Chronic issue. 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  Also had bilateral knee replacements and pt was not able to get the bend back in her knees.  January 2018, pt fainted. Had tests which revealed pt had a stroke a while ago. Pt husband said that he thinks it did not affect anyrhing.  Pt has difficulty getting up from a chair without help.  Had a fall around May 2019 when pt was in the shower.  Pt was trying to get up from her shower chair and her foot slipped. Did not injure anything.  No other falls since then. Pt husband also states that pt's head gets ahead of herself, meaning what her brain tells her feet to do is not fast enough to react.   Pt states fear of falling but does not keep her from doing activities or getting out of her home.    Pt states not having back, hip, or knee pain currently.  Occasional back pain for the past 3 months.     Patient Stated Goals  Pt expresses desire to get better.                                PT Education - 06/08/18 1615    Education Details  ther-ex    Person(s) Educated  Patient    Methods  Explanation;Demonstration;Tactile cues;Verbal cues    Comprehension  Returned demonstration;Verbalized understanding        Objective  MedbridgeAccess Code: [email or web address]   osteopenia   85degrees R knee flexion AROM at start of session   Therapeutic exercise   Seated R knee flexionPROM with PT 10x3 Then AAROM 10x3 with PT assist to end range Then seated L knee flexion AROM 5x able to achieve 90degrees R knee flexion AROM 5 x consistently  Sit <> stand from elevated mat table 10xat 22inches high,  Bilateral UE assist to stand,no UE assistto sit.             Then from chair with arms and pillow. B UE assist to stand, no UE assist to sit 5x2. Pillow behind head since chair is against the wall.   Seated clamshells, hips less than 90 degrees flexion(has not done HEP) blue and green band 10x10 seconds for 2 setsto promote glute med strengthening  Seated hip adduction ball and glutemaxsqueeze 10x5 seconds    Gait with 180 degree turns, using SPC CGA 6x    Standing alternating toe taps onto regular step without UE assist 10x2 each LE  Forward step onto and over 4 inch step with R LE and one UE assist 5x to promote ability to step down with L LE when negotiating steps    Improved exercise technique, movement at target joints, use of target muscles after min to mod verbal, visual, tactile cues.     Continued working on bilateral LE strengthening to decrease difficulty with transfers and promote strength to support herself when performing standing tasks. Added gait with 180 degree turns to promote balance. Continues to be able to perform standing  alternating toe taps onto regular step without UE assist and not LOB. Pt tolerated session well without aggravation of symptoms. Pt will benefit from continued skilled PT services to improve strength, balance, and function.      PT Short Term Goals - 05/31/18 1420      PT SHORT TERM GOAL #1   Title  Patient will be independent with her HEP to promote ability to stand up from a chair,  improve LE strength, and improve balance.     Time  3    Period  Weeks    Status  On-going    Target Date  06/24/18        PT Long Term Goals - 05/31/18 1420      PT LONG TERM GOAL #1   Title  Patient will improve bilateral knee flexion ROM by at least 12 degrees to promote ability to stand up from her shower chair with less difficulty to decrease fall risk     Baseline  Knee flexion AROM: 73 degrees R, 80 degrees L (04/28/2018); 90 degrees R, 91 L (05/31/2018)    Time  8    Period  Weeks    Status  Partially Met    Target Date  06/24/18      PT LONG TERM GOAL #2   Title  Patient will improve bilateral LE strength by at least 1/2 MMT grade to promote ability to perform standing tasks as well as decrease fall risk.      Time  8    Period  Weeks    Status  Partially Met    Target Date  06/24/18      PT LONG TERM GOAL #3   Title  Patient will improve her Berg Balance score by at least 6 points as a demonstration of improved balance.     Baseline  35/56 (<46 suggests increased fall risk; 04/28/2018); 45/56 (05/31/2018)    Time  8    Period  Weeks    Status  Achieved    Target Date  06/24/18      PT LONG TERM GOAL #4   Title  Patient will improve her FOTO score by at least 8 points as a demonstration of improved function.     Baseline  LE FOTO 49 (04/28/2018); 53 (05/31/2018)    Time  8    Period  Weeks    Status  Partially Met    Target Date  06/24/18            Plan - 06/08/18 1628    Clinical Impression Statement  Continued working on bilateral LE strengthening to decrease difficulty  with transfers and promote strength to support herself when performing standing tasks. Added gait with 180 degree turns to promote balance. Continues to be able to perform standing alternating toe taps onto regular step without UE assist and not LOB. Pt tolerated session well without aggravation of symptoms. Pt will benefit from continued skilled PT services to improve strength, balance, and function.     Rehab Potential  Fair    Clinical Impairments Affecting Rehab Potential  (-) age, chronicity of condition, weakness, limited bilateral knee flexion ROM, decreased balance; (+) good husband support    PT Frequency  2x / week    PT Duration  8 weeks    PT Treatment/Interventions  Balance training;Neuromuscular re-education;Patient/family education;Manual techniques;Passive range of motion;Dry needling;Therapeutic activities;Therapeutic exercise;Gait training;Electrical Stimulation;Iontophoresis 52m/ml Dexamethasone;Aquatic Therapy    PT Next Visit Plan  knee flexion ROM, glute and quadriceps strengthening, balance training, manual techniques, modalities PRN    Consulted and Agree with Plan of Care  Patient       Patient will benefit from skilled therapeutic intervention in order to improve the following deficits and impairments:  Postural dysfunction, Improper body mechanics, Difficulty walking, Decreased strength, Decreased range of motion, Decreased balance  Visit Diagnosis: History of falling  Unsteadiness on feet  Muscle weakness (generalized)  Difficulty in  walking, not elsewhere classified     Problem List Patient Active Problem List   Diagnosis Date Noted  . Absolute anemia 06/26/2015  . A-fib ([place]) 06/26/2015  . Hepatic cyst 06/26/2015  . BP (high blood pressure) 06/26/2015  . HLD (hyperlipidemia) 06/26/2015  . H/O: osteoarthritis 06/26/2015  . Arthritis, degenerative 06/26/2015  . Chronic kidney disease (CKD), stage III (moderate) ([place]) 06/26/2015  . Osteopenia 06/26/2015   . Obstructive apnea 04/19/2015   Joneen Boers PT, DPT   06/08/2018, 6:23 PM  [facility] [address], [place], [address] Phone: [phone]   Fax:  [phone]  Name: Jaclyn Galvan MRN: [id number] Date of Birth: Jan 09, 1941

## 2018-06-10 ENCOUNTER — Ambulatory Visit: Payer: Medicare Other

## 2018-06-10 DIAGNOSIS — Z9181 History of falling: Secondary | ICD-10-CM

## 2018-06-10 DIAGNOSIS — R2681 Unsteadiness on feet: Secondary | ICD-10-CM

## 2018-06-10 DIAGNOSIS — M6281 Muscle weakness (generalized): Secondary | ICD-10-CM

## 2018-06-10 NOTE — Therapy (Signed)
[place] [facility] [address] [place], [address] Phone: [phone]   Fax:  [phone]  Physical Therapy Treatment  Patient Details  Name: Jaclyn Galvan MRN: [id number] Date of Birth: Jul 29, 1941 Referring Provider: Tama High, MD   Encounter Date: 06/10/2018  PT End of Session - 06/10/18 1603    Visit Number  11    Number of Visits  17    Date for PT Re-Evaluation  06/24/18    Authorization Type  2    Authorization Time Period  of 10 progress report    PT Start Time  1601    PT Stop Time  1645    PT Time Calculation (min)  44 min    Equipment Utilized During Treatment  Gait belt   Pt SPC   Activity Tolerance  Patient tolerated treatment well    Behavior During Therapy  WFL for tasks assessed/performed       Past Medical History:  Diagnosis Date  . Anemia    resolved from the beginning of this year  . Atrial fibrillation [facility])    cardioversion March 2004  . Cholelithiasis   . Chronic gastritis   . CKD (chronic kidney disease)    chronic kidney disease stage III  . Diverticulosis   . 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 DJD (degenerative joint disease)   . Elbow injury   . Fibromyalgia   . HH (hiatus hernia)   . Hypercholesterolemia    controlled with meds  . Hypertension    controlled with meds, checks regularly  . Liver cyst   . Osteopenia   . PPD positive   . Renal insufficiency   . Sleep apnea     Past Surgical History:  Procedure Laterality Date  . ADENOIDECTOMY    . Adenomatous polyp of duodenum    . APPENDECTOMY    . BACK SURGERY    . BILATERAL OOPHORECTOMY Right   . BREAST BIOPSY Right    1988 negative  . BREAST BIOPSY Right 09/25/2015   stereo,benign  . BREAST EXCISIONAL BIOPSY Left    ? when,benign  . CHOLECYSTECTOMY    . DILATION AND CURETTAGE OF UTERUS  1966  . ESOPHAGOGASTRODUODENOSCOPY (EGD) WITH PROPOFOL N/A 07/31/2015    Procedure: ESOPHAGOGASTRODUODENOSCOPY (EGD) WITH PROPOFOL;  Surgeon: [name], MD;  Location: [facility] ENDOSCOPY;  Service: Endoscopy;  Laterality: N/A;  . ESOPHAGOGASTRODUODENOSCOPY (EGD) WITH PROPOFOL N/A 05/06/2016   Procedure: ESOPHAGOGASTRODUODENOSCOPY (EGD) WITH PROPOFOL;  Surgeon: [name], MD;  Location: [facility] ENDOSCOPY;  Service: Endoscopy;  Laterality: N/A;  . ESOPHAGOGASTRODUODENOSCOPY (EGD) WITH PROPOFOL N/A 10/30/2017   Procedure: ESOPHAGOGASTRODUODENOSCOPY (EGD) WITH PROPOFOL;  Surgeon: [name], MD;  Location: [facility] ENDOSCOPY;  Service: Endoscopy;  Laterality: N/A;  . JOINT REPLACEMENT     total knee Dr. Sabra Heck with excessive postoperative bleeding  . RHIZOTOMY    . TONSILLECTOMY    . TOTAL KNEE ARTHROPLASTY Bilateral    2001,2006  . TUBAL LIGATION  1971    There were no vitals filed for this visit.  Subjective Assessment - 06/10/18 1602    Subjective  Pt reports that she is doing well today. No recent changes in medications or health. No pain reported. HEP is going well. No specific questions or concerns. Pt denies falls since last therapy session.     Pertinent History  Balance/leg weakness. Chronic issue. Was discovered to have had a  small stroke which happened a while ago. Also had bilateral knee replacements and pt was not able to get the bend back in her knees.  January 2018, pt fainted. Had tests which revealed pt had a stroke a while ago. Pt husband said that he thinks it did not affect anyrhing.  Pt has difficulty getting up from a chair without help.  Had a fall around May 2019 when pt was in the shower.  Pt was trying to get up from her shower chair and her foot slipped. Did not injure anything.  No other falls since then. Pt husband also states that pt's head gets ahead of herself, meaning what her brain tells her feet to do is not fast enough to react.   Pt states fear of falling but does not keep her from doing activities or getting out of her  home.    Pt states not having back, hip, or knee pain currently. Occasional back pain for the past 3 months.     Patient Stated Goals  Pt expresses desire to get better.     Currently in Pain?  No/denies        TREATMENT  Therapeutic exercise NuStep L2 x 4 minutes for warm-up during history (3 minutes unbilled); Seated R knee flexionPROM with PT 10x3 (prior to stretch R knee is 91 degrees, after stretches remains at 91); Sit <> stand from elevated mat tableat 22inches high x 10, 21 inches high x 10; Seated clamshells, hips less than 90 degrees flexion with blue tband 2 x 10 Seated marches with blue tband resistance 2 x 10 Seated hip adduction ball squeeze 3s hold 2 x 10; Standing alternating toe taps onto 6" step without UE assist x 10 each LE; Forward step onto 6" step alternating LE with faded UE support until pt is able to perform without UE support x 10 each Gait with 180 degree turns, using SPC CGA 6x   Ambulation with horizontal and vertical head turns on command 30' x 4; Standing on Airex with alternating toe taps onto 6" step without UE assist 2 x 10 each LE;  Improved exercise technique, movement at target joints, use of target muscles after min to mod verbal, visual, tactile cues.                            PT Education - 06/10/18 1603    Education Details  exercise form/technique    Person(s) Educated  Patient    Methods  Explanation    Comprehension  Verbalized understanding       PT Short Term Goals - 05/31/18 1420      PT SHORT TERM GOAL #1   Title  Patient will be independent with her HEP to promote ability to stand up from a chair, improve LE strength, and improve balance.     Time  3    Period  Weeks    Status  On-going    Target Date  06/24/18        PT Long Term Goals - 05/31/18 1420      PT LONG TERM GOAL #1   Title  Patient will improve bilateral knee flexion ROM by at least 12 degrees to promote ability to stand up  from her shower chair with less difficulty to decrease fall risk     Baseline  Knee flexion AROM: 73 degrees R, 80 degrees L (04/28/2018); 90 degrees R, 91 L (05/31/2018)  Time  8    Period  Weeks    Status  Partially Met    Target Date  06/24/18      PT LONG TERM GOAL #2   Title  Patient will improve bilateral LE strength by at least 1/2 MMT grade to promote ability to perform standing tasks as well as decrease fall risk.      Time  8    Period  Weeks    Status  Partially Met    Target Date  06/24/18      PT LONG TERM GOAL #3   Title  Patient will improve her Berg Balance score by at least 6 points as a demonstration of improved balance.     Baseline  35/56 (<46 suggests increased fall risk; 04/28/2018); 45/56 (05/31/2018)    Time  8    Period  Weeks    Status  Achieved    Target Date  06/24/18      PT LONG TERM GOAL #4   Title  Patient will improve her FOTO score by at least 8 points as a demonstration of improved function.     Baseline  LE FOTO 49 (04/28/2018); 53 (05/31/2018)    Time  8    Period  Weeks    Status  Partially Met    Target Date  06/24/18            Plan - 06/10/18 1603    Clinical Impression Statement  Continued working on bilateral LE strengthening to decrease difficulty with transfers and promote strength to support herself when performing standing tasks. Worked on ambulation with head turns in order to improve dynamic balance. Continues to be able to perform standing alternating toe taps onto regular step without UE assist and no LOB so added Airex pad to make more challenging. Pt tolerated session well without aggravation of symptoms. Pt will benefit from continued skilled PT services to improve strength, balance, and function.     Rehab Potential  Fair    Clinical Impairments Affecting Rehab Potential  (-) age, chronicity of condition, weakness, limited bilateral knee flexion ROM, decreased balance; (+) good husband support    PT Frequency  2x / week     PT Duration  8 weeks    PT Treatment/Interventions  Balance training;Neuromuscular re-education;Patient/family education;Manual techniques;Passive range of motion;Dry needling;Therapeutic activities;Therapeutic exercise;Gait training;Electrical Stimulation;Iontophoresis 53m/ml Dexamethasone;Aquatic Therapy    PT Next Visit Plan  knee flexion ROM, glute and quadriceps strengthening, balance training, manual techniques, modalities PRN    Consulted and Agree with Plan of Care  Patient       Patient will benefit from skilled therapeutic intervention in order to improve the following deficits and impairments:  Postural dysfunction, Improper body mechanics, Difficulty walking, Decreased strength, Decreased range of motion, Decreased balance  Visit Diagnosis: History of falling  Unsteadiness on feet  Muscle weakness (generalized)     Problem List Patient Active Problem List   Diagnosis Date Noted  . Absolute anemia 06/26/2015  . A-fib ([facility] 06/26/2015  . Hepatic cyst 06/26/2015  . BP (high blood pressure) 06/26/2015  . HLD (hyperlipidemia) 06/26/2015  . H/O: osteoarthritis 06/26/2015  . Arthritis, degenerative 06/26/2015  . Chronic kidney disease (CKD), stage III (moderate) ([facility] 06/26/2015  . Osteopenia 06/26/2015  . Obstructive apnea 04/19/2015   JLyndel SafeHuprich PT, DPT, GCS  Huprich,Jason 06/10/2018, 5:00 PM  [facility] [address] [place] [address]Phone: [phone]  Fax:  [phone]  Name: Jaclyn Galvan MRN: [id number] Date of Birth: 1941/01/30

## 2018-06-14 ENCOUNTER — Ambulatory Visit: Payer: Medicare Other

## 2018-06-14 DIAGNOSIS — Z9181 History of falling: Secondary | ICD-10-CM

## 2018-06-14 DIAGNOSIS — R2681 Unsteadiness on feet: Secondary | ICD-10-CM

## 2018-06-14 DIAGNOSIS — M6281 Muscle weakness (generalized): Secondary | ICD-10-CM

## 2018-06-14 DIAGNOSIS — R262 Difficulty in walking, not elsewhere classified: Secondary | ICD-10-CM

## 2018-06-14 NOTE — Therapy (Signed)
[place] [facility] [address], [place], [address] Phone: [phone]   Fax:  [phone]  Physical Therapy Treatment  Patient Details  Name: Jaclyn Galvan MRN: [id number] Date of Birth: [birth year] Referring Provider: Tama High, MD   Encounter Date: 06/14/2018  PT End of Session - 06/14/18 1040    Visit Number  12    Number of Visits  17    Date for PT Re-Evaluation  06/24/18    Authorization Type  3    Authorization Time Period  of 10 progress report    PT Start Time  1040   pt arrived late   PT Stop Time  1112    PT Time Calculation (min)  32 min    Equipment Utilized During Treatment  Gait belt   Pt SPC   Activity Tolerance  Patient tolerated treatment well    Behavior During Therapy  WFL for tasks assessed/performed       Past Medical History:  Diagnosis Date  . Anemia    resolved from the beginning of this year  . Atrial fibrillation [facility])    cardioversion March 2004  . Cholelithiasis   . Chronic gastritis   . CKD (chronic kidney disease)    chronic kidney disease stage III  . Diverticulosis   . 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 DJD (degenerative joint disease)   . Elbow injury   . Fibromyalgia   . HH (hiatus hernia)   . Hypercholesterolemia    controlled with meds  . Hypertension    controlled with meds, checks regularly  . Liver cyst   . Osteopenia   . PPD positive   . Renal insufficiency   . Sleep apnea     Past Surgical History:  Procedure Laterality Date  . ADENOIDECTOMY    . Adenomatous polyp of duodenum    . APPENDECTOMY    . BACK SURGERY    . BILATERAL OOPHORECTOMY Right   . BREAST BIOPSY Right    1988 negative  . BREAST BIOPSY Right 09/25/2015   stereo,benign  . BREAST EXCISIONAL BIOPSY Left    ? when,benign  . CHOLECYSTECTOMY    . DILATION AND CURETTAGE OF UTERUS  1966  . ESOPHAGOGASTRODUODENOSCOPY (EGD) WITH  PROPOFOL N/A 07/31/2015   Procedure: ESOPHAGOGASTRODUODENOSCOPY (EGD) WITH PROPOFOL;  Surgeon: [name], MD;  Location: [facility] ENDOSCOPY;  Service: Endoscopy;  Laterality: N/A;  . ESOPHAGOGASTRODUODENOSCOPY (EGD) WITH PROPOFOL N/A 05/06/2016   Procedure: ESOPHAGOGASTRODUODENOSCOPY (EGD) WITH PROPOFOL;  Surgeon: [name], MD;  Location: [facility] ENDOSCOPY;  Service: Endoscopy;  Laterality: N/A;  . ESOPHAGOGASTRODUODENOSCOPY (EGD) WITH PROPOFOL N/A 10/30/2017   Procedure: ESOPHAGOGASTRODUODENOSCOPY (EGD) WITH PROPOFOL;  Surgeon: [name], MD;  Location: [facility] ENDOSCOPY;  Service: Endoscopy;  Laterality: N/A;  . JOINT REPLACEMENT     total knee Dr. Sabra Heck with excessive postoperative bleeding  . RHIZOTOMY    . TONSILLECTOMY    . TOTAL KNEE ARTHROPLASTY Bilateral    2001,2006  . TUBAL LIGATION  1971    There were no vitals filed for this visit.  Subjective Assessment - 06/14/18 1043    Subjective  Pt states that she is doing ok. Feels that her balance is not much better. So used to being careful so she does not fall. Better able to get up and down her commode and chair.  Has not been doing her HEP.  Pertinent History  Balance/leg weakness. Chronic issue. 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 Also had bilateral knee replacements and pt was not able to get the bend back in her knees.  January 2018, pt fainted. Had tests which revealed pt had a stroke a while ago. Pt husband said that he thinks it did not affect anyrhing.  Pt has difficulty getting up from a chair without help.  Had a fall around May 2019 when pt was in the shower.  Pt was trying to get up from her shower chair and her foot slipped. Did not injure anything.  No other falls since then. Pt husband also states that pt's head gets ahead of herself, meaning what her brain tells her feet to do is not fast enough to react.   Pt states fear of falling but does not keep her from doing  activities or getting out of her home.    Pt states not having back, hip, or knee pain currently. Occasional back pain for the past 3 months.     Patient Stated Goals  Pt expresses desire to get better.     Currently in Pain?  No/denies                               PT Education - 06/14/18 1050    Education Details  ther-ex    Person(s) Educated  Patient    Methods  Explanation;Demonstration;Tactile cues;Verbal cues    Comprehension  Returned demonstration;Verbalized understanding      Objective  MedbridgeAccess Code: [email or web address]   osteopenia   85degrees R knee flexion AROM at start of session   Therapeutic exercise   Seated R knee flexionPROM with PT 10x3 Then AAROM 10x3 with PT assist to end range Then seated L knee flexion AROM 5x   Gait with 180 degree turns, using SPC CGA 8x (4x to the R, 4x to the L). R lateral lean with slight unsteadiness when turning to the R at first try but pt able to self recover.   Side stepping 5 ft to the R and 5 ft to the L without UE assist   Forward step up onto and over Air Ex pad with light touch assist 5x2 each LE  LOB when stepping onto with R LE and off with L LE multiple times  Better able to perform when pt is cued to turn after stepping down.   SLS with bilateral UE assist 10x5 seconds with CGA to promote balance and glute med muscle strengthening.    Improved exercise technique, movement at target joints, use of target muscles after min to mod verbal, visual, tactile cues.    Continued working on gait with 180 degree turns, side stepping without assist, and steps onto Air Ex pad to promote balance. R lateral lean while on R LE stance phase tends to cause unsteadiness for pt.         PT Short Term Goals - 05/31/18 1420      PT SHORT TERM GOAL #1   Title  Patient will be independent with her HEP to promote ability to stand up from a chair,  improve LE strength, and improve balance.     Time  3    Period  Weeks    Status  On-going    Target Date  06/24/18        PT Long Term Goals - 05/31/18 1420  PT LONG TERM GOAL #1   Title  Patient will improve bilateral knee flexion ROM by at least 12 degrees to promote ability to stand up from her shower chair with less difficulty to decrease fall risk     Baseline  Knee flexion AROM: 73 degrees R, 80 degrees L (04/28/2018); 90 degrees R, 91 L (05/31/2018)    Time  8    Period  Weeks    Status  Partially Met    Target Date  06/24/18      PT LONG TERM GOAL #2   Title  Patient will improve bilateral LE strength by at least 1/2 MMT grade to promote ability to perform standing tasks as well as decrease fall risk.      Time  8    Period  Weeks    Status  Partially Met    Target Date  06/24/18      PT LONG TERM GOAL #3   Title  Patient will improve her Berg Balance score by at least 6 points as a demonstration of improved balance.     Baseline  35/56 (<46 suggests increased fall risk; 04/28/2018); 45/56 (05/31/2018)    Time  8    Period  Weeks    Status  Achieved    Target Date  06/24/18      PT LONG TERM GOAL #4   Title  Patient will improve her FOTO score by at least 8 points as a demonstration of improved function.     Baseline  LE FOTO 49 (04/28/2018); 53 (05/31/2018)    Time  8    Period  Weeks    Status  Partially Met    Target Date  06/24/18            Plan - 06/14/18 2021    Clinical Impression Statement  Continued working on gait with 180 degree turns, side stepping without assist, and steps onto Air Ex pad to promote balance. R lateral lean while on R LE stance phase tends to cause unsteadiness for pt.     Rehab Potential  Fair    Clinical Impairments Affecting Rehab Potential  (-) age, chronicity of condition, weakness, limited bilateral knee flexion ROM, decreased balance; (+) good husband support    PT Frequency  2x / week    PT Duration  8 weeks    PT  Treatment/Interventions  Balance training;Neuromuscular re-education;Patient/family education;Manual techniques;Passive range of motion;Dry needling;Therapeutic activities;Therapeutic exercise;Gait training;Electrical Stimulation;Iontophoresis 39m/ml Dexamethasone;Aquatic Therapy    PT Next Visit Plan  knee flexion ROM, glute and quadriceps strengthening, balance training, manual techniques, modalities PRN    Consulted and Agree with Plan of Care  Patient       Patient will benefit from skilled therapeutic intervention in order to improve the following deficits and impairments:  Postural dysfunction, Improper body mechanics, Difficulty walking, Decreased strength, Decreased range of motion, Decreased balance  Visit Diagnosis: History of falling  Unsteadiness on feet  Muscle weakness (generalized)  Difficulty in walking, not elsewhere classified     Problem List Patient Active Problem List   Diagnosis Date Noted  . Absolute anemia 06/26/2015  . A-fib ([place] 06/26/2015  . Hepatic cyst 06/26/2015  . BP (high blood pressure) 06/26/2015  . HLD (hyperlipidemia) 06/26/2015  . H/O: osteoarthritis 06/26/2015  . Arthritis, degenerative 06/26/2015  . Chronic kidney disease (CKD), stage III (moderate) ([place] 06/26/2015  . Osteopenia 06/26/2015  . Obstructive apnea 04/19/2015   MJoneen BoersPT, DPT   06/14/2018, 8:30  PM  [place] [facility] [address] [place], [address] Phone: [phone]   Fax:  [phone]  Name: Jaclyn Galvan MRN: [id number] Date of Birth: January 25, 1941

## 2018-06-16 ENCOUNTER — Ambulatory Visit: Payer: Medicare Other

## 2018-06-16 DIAGNOSIS — Z9181 History of falling: Secondary | ICD-10-CM

## 2018-06-16 DIAGNOSIS — M6281 Muscle weakness (generalized): Secondary | ICD-10-CM

## 2018-06-16 DIAGNOSIS — R262 Difficulty in walking, not elsewhere classified: Secondary | ICD-10-CM

## 2018-06-16 DIAGNOSIS — R2681 Unsteadiness on feet: Secondary | ICD-10-CM

## 2018-06-16 NOTE — Therapy (Signed)
[place] [address] [place], [address] Phone: [phone]   Fax:  [phone]  Physical Therapy Treatment  Patient Details  Name: Jaclyn Galvan MRN: [id number] Date of Birth: Feb 17, 1941 Referring Provider: Tama High, MD   Encounter Date: 06/16/2018  PT End of Session - 06/16/18 1304    Visit Number  13    Number of Visits  17    Date for PT Re-Evaluation  06/24/18    Authorization Type  4    Authorization Time Period  of 10 progress report    PT Start Time  1304    PT Stop Time  1344    PT Time Calculation (min)  40 min    Equipment Utilized During Treatment  Gait belt   Pt SPC   Activity Tolerance  Patient tolerated treatment well    Behavior During Therapy  [facility] for tasks assessed/performed       Past Medical History:  Diagnosis Date  . Anemia    resolved from the beginning of this year  . Atrial fibrillation [facility])    cardioversion March 2004  . Cholelithiasis   . Chronic gastritis   . CKD (chronic kidney disease)    chronic kidney disease stage III  . Diverticulosis   . 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 DJD (degenerative joint disease)   . Elbow injury   . Fibromyalgia   . HH (hiatus hernia)   . Hypercholesterolemia    controlled with meds  . Hypertension    controlled with meds, checks regularly  . Liver cyst   . Osteopenia   . PPD positive   . Renal insufficiency   . Sleep apnea     Past Surgical History:  Procedure Laterality Date  . ADENOIDECTOMY    . Adenomatous polyp of duodenum    . APPENDECTOMY    . BACK SURGERY    . BILATERAL OOPHORECTOMY Right   . BREAST BIOPSY Right    1988 negative  . BREAST BIOPSY Right 09/25/2015   stereo,benign  . BREAST EXCISIONAL BIOPSY Left    ? when,benign  . CHOLECYSTECTOMY    . DILATION AND CURETTAGE OF UTERUS  1966  . ESOPHAGOGASTRODUODENOSCOPY (EGD) WITH PROPOFOL N/A 07/31/2015    Procedure: ESOPHAGOGASTRODUODENOSCOPY (EGD) WITH PROPOFOL;  Surgeon: [name], MD;  Location: [facility] ENDOSCOPY;  Service: Endoscopy;  Laterality: N/A;  . ESOPHAGOGASTRODUODENOSCOPY (EGD) WITH PROPOFOL N/A 05/06/2016   Procedure: ESOPHAGOGASTRODUODENOSCOPY (EGD) WITH PROPOFOL;  Surgeon: [name], MD;  Location: [facility] ENDOSCOPY;  Service: Endoscopy;  Laterality: N/A;  . ESOPHAGOGASTRODUODENOSCOPY (EGD) WITH PROPOFOL N/A 10/30/2017   Procedure: ESOPHAGOGASTRODUODENOSCOPY (EGD) WITH PROPOFOL;  Surgeon: [name], MD;  Location: [facility] ENDOSCOPY;  Service: Endoscopy;  Laterality: N/A;  . JOINT REPLACEMENT     total knee Dr. Sabra Heck with excessive postoperative bleeding  . RHIZOTOMY    . TONSILLECTOMY    . TOTAL KNEE ARTHROPLASTY Bilateral    2001,2006  . TUBAL LIGATION  1971    There were no vitals filed for this visit.  Subjective Assessment - 06/16/18 1306    Subjective  No pain or discomfort. Doing good.     Pertinent History  Balance/leg weakness. Chronic issue. 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 Also had bilateral knee replacements and pt was not able to get the bend back in her knees.  January 2018,  pt fainted. Had tests which revealed pt had a stroke a while ago. Pt husband said that he thinks it did not affect anyrhing.  Pt has difficulty getting up from a chair without help.  Had a fall around May 2019 when pt was in the shower.  Pt was trying to get up from her shower chair and her foot slipped. Did not injure anything.  No other falls since then. Pt husband also states that pt's head gets ahead of herself, meaning what her brain tells her feet to do is not fast enough to react.   Pt states fear of falling but does not keep her from doing activities or getting out of her home.    Pt states not having back, hip, or knee pain currently. Occasional back pain for the past 3 months.     Patient Stated Goals  Pt expresses desire to get better.  Wants to go up and down the stairs better.     Currently in Pain?  No/denies                               PT Education - 06/16/18 1314    Education Details  ther-ex    Person(s) Educated  Patient    Methods  Explanation;Demonstration;Tactile cues;Verbal cues    Comprehension  Returned demonstration;Verbalized understanding        Objective  MedbridgeAccess Code: [email or web address]   osteopenia   87.5degrees R knee flexion AROM at start of session   Therapeutic exercise   Seated R knee flexionPROM with PT 10x3 Then AAROM 10x3 with PT assist to end range Then seated L knee flexion AROM 5x  Able to achieve 90 degrees seated knee flexion AROM  Standing R hip abduction with bilateral UE assist, 2 lbs 10x 3 to promote glute med muscle strengthening   Standing LE leg press resisting blue band with bilateral UE assist   R LE 10x3  L LE 10x3  Ascending and descending 4 regular steps with bilateral UE to one UE assist 2x  Forward step up onto regular step with one UE assist 5x each LE  Can perform at home as part of HEP. Pt demonstrated and verbalized understanding.   Forward step up onto and over 4 inch step with R LE and one UE assist 10x, then 8x. Better able to bend R knee when stepping off with L LE compared to previous visits.   Gait with 180 degree turns, using SPC CGA 8x (4x to the R, 4x to the L). Slight unsteadiness one time due to hip strategy (forward flexion from hips at the end of a turn). Pt able to self recover using SPC.    Improved exercise technique, movement at target joints, use of target muscles after min to mod verbal, visual, tactile cues.   Continued working on R knee flexion and LE strength to promote balance and decrease difficulty performing standing tasks such as stair negotiation. Slight unsteadiness with 180 degree turns due to hip strategy (forward flexion) but pt able to self  recover with SPC.       PT Short Term Goals - 05/31/18 1420      PT SHORT TERM GOAL #1   Title  Patient will be independent with her HEP to promote ability to stand up from a chair, improve LE strength, and improve balance.     Time  3    Period  Weeks  Status  On-going    Target Date  06/24/18        PT Long Term Goals - 05/31/18 1420      PT LONG TERM GOAL #1   Title  Patient will improve bilateral knee flexion ROM by at least 12 degrees to promote ability to stand up from her shower chair with less difficulty to decrease fall risk     Baseline  Knee flexion AROM: 73 degrees R, 80 degrees L (04/28/2018); 90 degrees R, 91 L (05/31/2018)    Time  8    Period  Weeks    Status  Partially Met    Target Date  06/24/18      PT LONG TERM GOAL #2   Title  Patient will improve bilateral LE strength by at least 1/2 MMT grade to promote ability to perform standing tasks as well as decrease fall risk.      Time  8    Period  Weeks    Status  Partially Met    Target Date  06/24/18      PT LONG TERM GOAL #3   Title  Patient will improve her Berg Balance score by at least 6 points as a demonstration of improved balance.     Baseline  35/56 (<46 suggests increased fall risk; 04/28/2018); 45/56 (05/31/2018)    Time  8    Period  Weeks    Status  Achieved    Target Date  06/24/18      PT LONG TERM GOAL #4   Title  Patient will improve her FOTO score by at least 8 points as a demonstration of improved function.     Baseline  LE FOTO 49 (04/28/2018); 53 (05/31/2018)    Time  8    Period  Weeks    Status  Partially Met    Target Date  06/24/18            Plan - 06/16/18 1258    Clinical Impression Statement  Continued working on R knee flexion and LE strength to promote balance and decrease difficulty performing standing tasks such as stair negotiation. Slight unsteadiness with 180 degree turns due to hip strategy (forward flexion) but pt able to self recover with SPC.    Rehab  Potential  Fair    Clinical Impairments Affecting Rehab Potential  (-) age, chronicity of condition, weakness, limited bilateral knee flexion ROM, decreased balance; (+) good husband support    PT Frequency  2x / week    PT Duration  8 weeks    PT Treatment/Interventions  Balance training;Neuromuscular re-education;Patient/family education;Manual techniques;Passive range of motion;Dry needling;Therapeutic activities;Therapeutic exercise;Gait training;Electrical Stimulation;Iontophoresis 44m/ml Dexamethasone;Aquatic Therapy    PT Next Visit Plan  knee flexion ROM, glute and quadriceps strengthening, balance training, manual techniques, modalities PRN    Consulted and Agree with Plan of Care  Patient       Patient will benefit from skilled therapeutic intervention in order to improve the following deficits and impairments:  Postural dysfunction, Improper body mechanics, Difficulty walking, Decreased strength, Decreased range of motion, Decreased balance  Visit Diagnosis: History of falling  Unsteadiness on feet  Muscle weakness (generalized)  Difficulty in walking, not elsewhere classified     Problem List Patient Active Problem List   Diagnosis Date Noted  . Absolute anemia 06/26/2015  . A-fib ([place] 06/26/2015  . Hepatic cyst 06/26/2015  . BP (high blood pressure) 06/26/2015  . HLD (hyperlipidemia) 06/26/2015  . H/O: osteoarthritis 06/26/2015  .  Arthritis, degenerative 06/26/2015  . Chronic kidney disease (CKD), stage III (moderate) ([place]) 06/26/2015  . Osteopenia 06/26/2015  . Obstructive apnea 04/19/2015    Joneen Boers PT, DPT   06/16/2018, 6:34 PM  [facility] [address] [place], [address] Phone: [phone]   Fax:  [phone]  Name: DWAYNA KENTNER MRN: [id number] Date of Birth: 05-08-1941

## 2018-06-16 NOTE — Patient Instructions (Signed)
Forward step up onto regular step with one UE assist 5x each LE  Can perform at home as part of HEP. Pt demonstrated and verbalized understanding.

## 2018-06-21 ENCOUNTER — Ambulatory Visit: Payer: Medicare Other

## 2018-06-21 DIAGNOSIS — Z9181 History of falling: Secondary | ICD-10-CM | POA: Diagnosis not present

## 2018-06-21 DIAGNOSIS — R2681 Unsteadiness on feet: Secondary | ICD-10-CM

## 2018-06-21 DIAGNOSIS — R262 Difficulty in walking, not elsewhere classified: Secondary | ICD-10-CM

## 2018-06-21 DIAGNOSIS — M6281 Muscle weakness (generalized): Secondary | ICD-10-CM

## 2018-06-21 NOTE — Therapy (Signed)
[place] [facility] [address], [place], [address] Phone: [phone]   Fax:  [phone]  Physical Therapy Treatment  Patient Details  Name: Jaclyn Galvan MRN: [id number] Date of Birth: September 12, 1941 Referring Provider: Tama High, MD   Encounter Date: 06/21/2018  PT End of Session - 06/21/18 1233    Visit Number  14    Number of Visits  17    Date for PT Re-Evaluation  06/24/18    Authorization Type  5    Authorization Time Period  of 10 progress report    PT Start Time  1117    PT Stop Time  1200    PT Time Calculation (min)  43 min    Equipment Utilized During Treatment  Gait belt   Pt SPC   Activity Tolerance  Patient tolerated treatment well    Behavior During Therapy  WFL for tasks assessed/performed       Past Medical History:  Diagnosis Date  . Anemia    resolved from the beginning of this year  . Atrial fibrillation [facility])    cardioversion March 2004  . Cholelithiasis   . Chronic gastritis   . CKD (chronic kidney disease)    chronic kidney disease stage III  . Diverticulosis   . 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 DJD (degenerative joint disease)   . Elbow injury   . Fibromyalgia   . HH (hiatus hernia)   . Hypercholesterolemia    controlled with meds  . Hypertension    controlled with meds, checks regularly  . Liver cyst   . Osteopenia   . PPD positive   . Renal insufficiency   . Sleep apnea     Past Surgical History:  Procedure Laterality Date  . ADENOIDECTOMY    . Adenomatous polyp of duodenum    . APPENDECTOMY    . BACK SURGERY    . BILATERAL OOPHORECTOMY Right   . BREAST BIOPSY Right    1988 negative  . BREAST BIOPSY Right 09/25/2015   stereo,benign  . BREAST EXCISIONAL BIOPSY Left    ? when,benign  . CHOLECYSTECTOMY    . DILATION AND CURETTAGE OF UTERUS  1966  . ESOPHAGOGASTRODUODENOSCOPY (EGD) WITH PROPOFOL N/A 07/31/2015    Procedure: ESOPHAGOGASTRODUODENOSCOPY (EGD) WITH PROPOFOL;  Surgeon: [name], MD;  Location: [facility] ENDOSCOPY;  Service: Endoscopy;  Laterality: N/A;  . ESOPHAGOGASTRODUODENOSCOPY (EGD) WITH PROPOFOL N/A 05/06/2016   Procedure: ESOPHAGOGASTRODUODENOSCOPY (EGD) WITH PROPOFOL;  Surgeon: [name], MD;  Location: [facility] ENDOSCOPY;  Service: Endoscopy;  Laterality: N/A;  . ESOPHAGOGASTRODUODENOSCOPY (EGD) WITH PROPOFOL N/A 10/30/2017   Procedure: ESOPHAGOGASTRODUODENOSCOPY (EGD) WITH PROPOFOL;  Surgeon: [name], MD;  Location: [facility] ENDOSCOPY;  Service: Endoscopy;  Laterality: N/A;  . JOINT REPLACEMENT     total knee Dr. Sabra Heck with excessive postoperative bleeding  . RHIZOTOMY    . TONSILLECTOMY    . TOTAL KNEE ARTHROPLASTY Bilateral    2001,2006  . TUBAL LIGATION  1971    There were no vitals filed for this visit.  Subjective Assessment - 06/21/18 1232    Subjective  Pt reports that she is doing well on this date. Denies pain. Reports some stiffness after last therapy session but no pain. No specific questions or concerns at this time.     Pertinent History  Balance/leg weakness. Chronic issue. Was discovered to have had a small stroke which happened  a while ago. Also had bilateral knee replacements and pt was not able to get the bend back in her knees.  January 2018, pt fainted. Had tests which revealed pt had a stroke a while ago. Pt husband said that he thinks it did not affect anyrhing.  Pt has difficulty getting up from a chair without help.  Had a fall around May 2019 when pt was in the shower.  Pt was trying to get up from her shower chair and her foot slipped. Did not injure anything.  No other falls since then. Pt husband also states that pt's head gets ahead of herself, meaning what her brain tells her feet to do is not fast enough to react.   Pt states fear of falling but does not keep her from doing activities or getting out of her home.    Pt states not having  back, hip, or knee pain currently. Occasional back pain for the past 3 months.     Patient Stated Goals  Pt expresses desire to get better. Wants to go up and down the stairs better.     Currently in Pain?  No/denies         TREATMENT  Therapeutic exercise Seated R knee flexionPROM with PT x 10, then AAROM x 10 with PT assist to end range, Able to achieve 90 degrees seated knee flexion AAROM with firm end feel Ascending and descending 4 regular steps with bilateral UE to one UE assist 2x Standing R hip abduction with bilateral UE assist, 2 lbs 10x 3 to promote glute med muscle strengthening  Standing LE leg press resisting blue band with bilateral UE assist 2 x 10 each; Side stepping up and over 4 inch step x 10 each direction; Gait with 180 degree turns, using SPC CGA8x(4x to the R, 4x to the L). Gait with horizontal head turns 20' x 4; Gait with ball toss horizontally to therapist 20' x multiple bouts;  Pt educated throughout session about proper posture and technique with exercises. Improved exercise technique, movement at target joints, use of target muscles after min to mod verbal, visual, tactile cues.   Continued working on R knee flexion and LE strength to promote balance and decrease difficulty performing standing tasks such as stair negotiation. Pt will need update of outcome measures and goals at next visit. Recertification vs discharge is needed per primary therapist. Pt would benefit from continued quick head turns with horizontal ball tosses during gait for habituation. Pt will benefit from PT services to address deficits in strength, balance, and mobility in order to return to full function at home.                          PT Short Term Goals - 05/31/18 1420      PT SHORT TERM GOAL #1   Title  Patient will be independent with her HEP to promote ability to stand up from a chair, improve LE strength, and improve balance.     Time  3     Period  Weeks    Status  On-going    Target Date  06/24/18        PT Long Term Goals - 05/31/18 1420      PT LONG TERM GOAL #1   Title  Patient will improve bilateral knee flexion ROM by at least 12 degrees to promote ability to stand up from her shower chair with less difficulty to decrease fall  risk     Baseline  Knee flexion AROM: 73 degrees R, 80 degrees L (04/28/2018); 90 degrees R, 91 L (05/31/2018)    Time  8    Period  Weeks    Status  Partially Met    Target Date  06/24/18      PT LONG TERM GOAL #2   Title  Patient will improve bilateral LE strength by at least 1/2 MMT grade to promote ability to perform standing tasks as well as decrease fall risk.      Time  8    Period  Weeks    Status  Partially Met    Target Date  06/24/18      PT LONG TERM GOAL #3   Title  Patient will improve her Berg Balance score by at least 6 points as a demonstration of improved balance.     Baseline  35/56 (<46 suggests increased fall risk; 04/28/2018); 45/56 (05/31/2018)    Time  8    Period  Weeks    Status  Achieved    Target Date  06/24/18      PT LONG TERM GOAL #4   Title  Patient will improve her FOTO score by at least 8 points as a demonstration of improved function.     Baseline  LE FOTO 49 (04/28/2018); 53 (05/31/2018)    Time  8    Period  Weeks    Status  Partially Met    Target Date  06/24/18            Plan - 06/21/18 1234    Clinical Impression Statement  Continued working on R knee flexion and LE strength to promote balance and decrease difficulty performing standing tasks such as stair negotiation. Pt will need update of outcome measures and goals at next visit. Recertification vs discharge is needed per primary therapist. Pt would benefit from continued quick head turns with horizontal ball tosses during gait for habituation. Pt will benefit from PT services to address deficits in strength, balance, and mobility in order to return to full function at home.     Rehab  Potential  Fair    Clinical Impairments Affecting Rehab Potential  (-) age, chronicity of condition, weakness, limited bilateral knee flexion ROM, decreased balance; (+) good husband support    PT Frequency  2x / week    PT Duration  8 weeks    PT Treatment/Interventions  Balance training;Neuromuscular re-education;Patient/family education;Manual techniques;Passive range of motion;Dry needling;Therapeutic activities;Therapeutic exercise;Gait training;Electrical Stimulation;Iontophoresis 75m/ml Dexamethasone;Aquatic Therapy    PT Next Visit Plan  outcome measures, goals, recert vs discharge, knee flexion ROM, glute and quadriceps strengthening, balance training, manual techniques, modalities PRN    Consulted and Agree with Plan of Care  Patient       Patient will benefit from skilled therapeutic intervention in order to improve the following deficits and impairments:  Postural dysfunction, Improper body mechanics, Difficulty walking, Decreased strength, Decreased range of motion, Decreased balance  Visit Diagnosis: Unsteadiness on feet  Muscle weakness (generalized)  History of falling  Difficulty in walking, not elsewhere classified     Problem List Patient Active Problem List   Diagnosis Date Noted  . Absolute anemia 06/26/2015  . [place] ([place] 06/26/2015  . Hepatic cyst 06/26/2015  . BP (high blood pressure) 06/26/2015  . HLD (hyperlipidemia) 06/26/2015  . H/O: osteoarthritis 06/26/2015  . Arthritis, degenerative 06/26/2015  . Chronic kidney disease (CKD), stage III (moderate) [place] 06/26/2015  . Osteopenia 06/26/2015  .  Obstructive apnea 04/19/2015   Lyndel Safe Roby Donaway PT, DPT, GCS  Abdulkadir Emmanuel 06/21/2018, 12:40 PM  [place] [facility] [address] [place], [address] Phone: [phone]   Fax:  [phone]  Name: Jaclyn Galvan MRN: [id number] Date of Birth: 11-Apr-1941

## 2018-06-23 ENCOUNTER — Ambulatory Visit: Payer: Medicare Other

## 2018-06-23 DIAGNOSIS — Z9181 History of falling: Secondary | ICD-10-CM

## 2018-06-23 DIAGNOSIS — R2681 Unsteadiness on feet: Secondary | ICD-10-CM

## 2018-06-23 DIAGNOSIS — R262 Difficulty in walking, not elsewhere classified: Secondary | ICD-10-CM

## 2018-06-23 DIAGNOSIS — M6281 Muscle weakness (generalized): Secondary | ICD-10-CM

## 2018-06-23 NOTE — Therapy (Signed)
[facility] [address] [place], [address] Phone: [phone]   Fax:  [phone]  Physical Therapy Treatment And Progress Report  Patient Details  Name: Jaclyn Galvan MRN: [id number] Date of Birth: Mar 09, 1941 Referring Provider: Tama High, MD   Encounter Date: 06/23/2018  PT End of Session - 06/23/18 1125    Visit Number  15    Number of Visits  27    Date for PT Re-Evaluation  07/29/18    Authorization Type  6    Authorization Time Period  of 10 progress report    PT Start Time  1124   pt arrived late   PT Stop Time  1205    PT Time Calculation (min)  41 min    Equipment Utilized During Treatment  Gait belt   Pt SPC   Activity Tolerance  Patient tolerated treatment well    Behavior During Therapy  WFL for tasks assessed/performed       Past Medical History:  Diagnosis Date  . Anemia    resolved from the beginning of this year  . Atrial fibrillation [facility])    cardioversion March 2004  . Cholelithiasis   . Chronic gastritis   . CKD (chronic kidney disease)    chronic kidney disease stage III  . Diverticulosis   . 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 DJD (degenerative joint disease)   . Elbow injury   . Fibromyalgia   . HH (hiatus hernia)   . Hypercholesterolemia    controlled with meds  . Hypertension    controlled with meds, checks regularly  . Liver cyst   . Osteopenia   . PPD positive   . Renal insufficiency   . Sleep apnea     Past Surgical History:  Procedure Laterality Date  . ADENOIDECTOMY    . Adenomatous polyp of duodenum    . APPENDECTOMY    . BACK SURGERY    . BILATERAL OOPHORECTOMY Right   . BREAST BIOPSY Right    1988 negative  . BREAST BIOPSY Right 09/25/2015   stereo,benign  . BREAST EXCISIONAL BIOPSY Left    ? when,benign  . CHOLECYSTECTOMY    . DILATION AND CURETTAGE OF UTERUS  1966  .  ESOPHAGOGASTRODUODENOSCOPY (EGD) WITH PROPOFOL N/A 07/31/2015   Procedure: ESOPHAGOGASTRODUODENOSCOPY (EGD) WITH PROPOFOL;  Surgeon: [name], MD;  Location: [facility] ENDOSCOPY;  Service: Endoscopy;  Laterality: N/A;  . ESOPHAGOGASTRODUODENOSCOPY (EGD) WITH PROPOFOL N/A 05/06/2016   Procedure: ESOPHAGOGASTRODUODENOSCOPY (EGD) WITH PROPOFOL;  Surgeon: [name], MD;  Location: [facility] ENDOSCOPY;  Service: Endoscopy;  Laterality: N/A;  . ESOPHAGOGASTRODUODENOSCOPY (EGD) WITH PROPOFOL N/A 10/30/2017   Procedure: ESOPHAGOGASTRODUODENOSCOPY (EGD) WITH PROPOFOL;  Surgeon: [name], MD;  Location: [facility] ENDOSCOPY;  Service: Endoscopy;  Laterality: N/A;  . JOINT REPLACEMENT     total knee Dr. Sabra Heck with excessive postoperative bleeding  . RHIZOTOMY    . TONSILLECTOMY    . TOTAL KNEE ARTHROPLASTY Bilateral    2001,2006  . TUBAL LIGATION  1971    There were no vitals filed for this visit.  Subjective Assessment - 06/23/18 1125    Subjective  Pt states doing good. No pain or discomfort. Feels that her balance is doing good. Has not fallen. No one has tapped her on her shoulder from behind.       Pertinent History  Balance/leg weakness. Chronic issue. 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 Also had bilateral knee replacements and pt was not able to get the bend back in her knees.  January 2018, pt fainted. Had tests which revealed pt had a stroke a while ago. Pt husband said that he thinks it did not affect anyrhing.  Pt has difficulty getting up from a chair without help.  Had a fall around May 2019 when pt was in the shower.  Pt was trying to get up from her shower chair and her foot slipped. Did not injure anything.  No other falls since then. Pt husband also states that pt's head gets ahead of herself, meaning what her brain tells her feet to do is not fast enough to react.   Pt states fear of falling but does not keep her from doing activities or getting  out of her home.    Pt states not having back, hip, or knee pain currently. Occasional back pain for the past 3 months.     Patient Stated Goals  Pt expresses desire to get better. Wants to go up and down the stairs better.     Currently in Pain?  No/denies         [facility] PT Assessment - 06/23/18 1138      Strength   Overall Strength Comments  Pillow used for padding    Right Hip Flexion  4/5    Right Hip Extension  5/5   seated manually resisted hip extension isometrics   Right Hip ABduction  4/5   seated clamshell isometrics   Left Hip Flexion  4+/5    Left Hip Extension  5/5   seated manually resisted hip extension isometrics   Left Hip ABduction  4/5   seated clamshell isometrics   Right Knee Flexion  4+/5    Right Knee Extension  5/5    Left Knee Flexion  4+/5    Left Knee Extension  5/5      Dynamic Gait Index   Level Surface  Mild Impairment    Change in Gait Speed  Mild Impairment    Gait with Horizontal Head Turns  Moderate Impairment    Gait with Vertical Head Turns  Moderate Impairment    Gait and Pivot Turn  Severe Impairment    Step Over Obstacle  Severe Impairment    Step Around Obstacles  Normal    Steps  Moderate Impairment    Total Score  10                                       PT Education - 06/23/18 1209    Education Details  ther-ex, progress/current status, plan of care    Person(s) Educated  Patient    Methods  Explanation;Demonstration;Tactile cues;Verbal cues    Comprehension  Returned demonstration;Verbalized understanding       Objective  MedbridgeAccess Code: [email or web address]   osteopenia   90degrees R, 90 degrees L knee flexion AROM at start of session   Therapeutic exercise   Seated R knee flexionPROM with PT 10x3 Then AAROM 10x3 with PT assist to end range Then seated L knee flexion AROM 10x  Reviewed plan of care: continue PT for 2x/week for 5 more weeks to continue  progress secondary to pt not performing her HEP. Pt verbalized understandng.   Seated manually resisted hip flexion, clamshell, hip extension, knee flexion, knee  extension 1-2x each way for each LE  Reviewed progress/current status with PT towards goals  With [facility]: Directed patient with gait with normal gait speed, with changes in speed, 180 degree pivot turn, with R and L cervical rotation position, with cervical flexion and extension position, stepping around obstacles, stepping over an obstacle, ascending and descending 4 regular steps with UE assist    Walking with SPC with 180 degree turns 8x (4x each side). Able to turn safely quickly in about 3 seconds both directions, other times, pt loses balance when trying to turn quickly. Able to turn safely when performing slowly.    Improved exercise technique, movement at target joints, use of target muscles after min to mod verbal, visual, tactile cues.  Cues needed to improve foot clearance when negotiating obstacle.    Pt demonstrates overall improved bilateral LE strength, knee flexion ROM, function, balance (improved Berg Balance test score) since initial evaluation. Pt also able to perform 180 degree turns quickly in about 3 seconds in each direction safely at times but loses balance at other times using her SPC. Pt still demonstrates LE weakness, difficulty with balance, especially during R LE stance phases of activities as well as with turning and negotiating obstacles, and difficulty performing functional tasks. Pt will benefit from continued skilled physical therapy services to address the aforementioned deficits.        PT Short Term Goals - 06/23/18 1211      PT SHORT TERM GOAL #1   Title  Patient will be independent with her HEP to promote ability to stand up from a chair, improve LE strength, and improve balance.     Baseline  Pt states not performing her HEP (consistently)    Time  3    Period  Weeks    Status  On-going     Target Date  07/15/18        PT Long Term Goals - 06/23/18 1212      PT LONG TERM GOAL #1   Title  Patient will improve bilateral knee flexion ROM by at least 12 degrees to promote ability to stand up from her shower chair with less difficulty to decrease fall risk     Baseline  Knee flexion AROM: 73 degrees R, 80 degrees L (04/28/2018); 90 degrees R, 91 L (05/31/2018); 90 degrees bilateral knee flexion AROM (06/23/2018)    Time  5    Period  Weeks    Status  Partially Met    Target Date  07/29/18      PT LONG TERM GOAL #2   Title  Patient will improve bilateral LE strength by at least 1/2 MMT grade to promote ability to perform standing tasks as well as decrease fall risk.      Time  5    Period  Weeks    Status  Partially Met   potentially met   Target Date  07/29/18      PT LONG TERM GOAL #3   Title  Patient will improve her Berg Balance score by at least 6 points as a demonstration of improved balance.     Baseline  35/56 (<46 suggests increased fall risk; 04/28/2018); 45/56 (05/31/2018)    Time  8    Period  Weeks    Status  Achieved    Target Date  06/24/18      PT LONG TERM GOAL #4   Title  Patient will improve her FOTO score by at least 8 points as  a demonstration of improved function.     Baseline  LE FOTO 49 (04/28/2018); 53 (05/31/2018); 52 (06/23/2018)    Time  5    Period  Weeks    Status  Partially Met    Target Date  07/29/18      PT LONG TERM GOAL #5   Title  Patient will improve her Dynamic Gait index score with SPC by at least 5 points as a demonstration of improved balance.     Baseline  DGI score: 10 (06/23/2018)    Time  5    Period  Weeks    Status  New    Target Date  07/29/18            Plan - 06/23/18 1209    Clinical Impression Statement  Pt demonstrates overall improved bilateral LE strength, knee flexion ROM, function, balance (improved Berg Balance test score) since initial evaluation. Pt also able to perform 180 degree turns quickly in  about 3 seconds in each direction safely at times but loses balance at other times using her SPC. Pt still demonstrates LE weakness, difficulty with balance, especially during R LE stance phases of activities as well as with turning and negotiating obstacles, and difficulty performing functional tasks. Pt will benefit from continued skilled physical therapy services to address the aforementioned deficits.     History and Personal Factors relevant to plan of care:  Chronicity of condition, limited bilateral knee flexion ROM, bilateral LE weakness, medical history, fall risk. Good husband support    Clinical Presentation  Stable    Clinical Presentation due to:  Pt making some progress with PT towards goals.     Clinical Decision Making  Low    Rehab Potential  Fair    Clinical Impairments Affecting Rehab Potential  (-) age, chronicity of condition, weakness, limited bilateral knee flexion ROM, decreased balance; (+) good husband support    PT Frequency  2x / week    PT Duration  Other (comment)   5 weeks   PT Treatment/Interventions  Balance training;Neuromuscular re-education;Patient/family education;Manual techniques;Passive range of motion;Dry needling;Therapeutic activities;Therapeutic exercise;Gait training;Electrical Stimulation;Iontophoresis '4mg'$ /ml Dexamethasone;Aquatic Therapy    PT Next Visit Plan   knee flexion ROM, glute and quadriceps strengthening, balance training, manual techniques, modalities PRN    Consulted and Agree with Plan of Care  Patient       Patient will benefit from skilled therapeutic intervention in order to improve the following deficits and impairments:  Postural dysfunction, Improper body mechanics, Difficulty walking, Decreased strength, Decreased range of motion, Decreased balance  Visit Diagnosis: Unsteadiness on feet - Plan: PT plan of care cert/re-cert  Muscle weakness (generalized) - Plan: PT plan of care cert/re-cert  History of falling - Plan: PT plan of  care cert/re-cert  Difficulty in walking, not elsewhere classified - Plan: PT plan of care cert/re-cert     Problem List Patient Active Problem List   Diagnosis Date Noted  . Absolute anemia 06/26/2015  . A-fib ([place]) 06/26/2015  . Hepatic cyst 06/26/2015  . BP (high blood pressure) 06/26/2015  . HLD (hyperlipidemia) 06/26/2015  . H/O: osteoarthritis 06/26/2015  . Arthritis, degenerative 06/26/2015  . Chronic kidney disease (CKD), stage III (moderate) ([place]) 06/26/2015  . Osteopenia 06/26/2015  . Obstructive apnea 04/19/2015    Thank you for your referral.  Joneen Boers PT, DPT    06/23/2018, 8:01 PM  [facility] [address], [place], [address] Phone:  [phone]   Fax:  [phone]  Name: Jaclyn Galvan MRN: [id number] Date of Birth: 01-04-1941

## 2018-06-29 ENCOUNTER — Ambulatory Visit: Payer: Medicare Other

## 2018-06-29 DIAGNOSIS — Z9181 History of falling: Secondary | ICD-10-CM

## 2018-06-29 DIAGNOSIS — M6281 Muscle weakness (generalized): Secondary | ICD-10-CM

## 2018-06-29 DIAGNOSIS — R262 Difficulty in walking, not elsewhere classified: Secondary | ICD-10-CM

## 2018-06-29 DIAGNOSIS — R2681 Unsteadiness on feet: Secondary | ICD-10-CM

## 2018-06-29 NOTE — Therapy (Signed)
[facility] [address], [place], [address] Phone: [phone]   Fax:  [phone]  Physical Therapy Treatment  Patient Details  Name: Jaclyn Galvan MRN: [id number] Date of Birth: Dec 01, 1940 Referring Provider: Tama High, MD   Encounter Date: 06/29/2018  PT End of Session - 06/29/18 1310    Visit Number  16    Number of Visits  27    Date for PT Re-Evaluation  07/29/18    Authorization Type  7    Authorization Time Period  of 10 progress report    PT Start Time  1310   pt arrived late   PT Stop Time  1350    PT Time Calculation (min)  40 min    Equipment Utilized During Treatment  Gait belt   Pt SPC   Activity Tolerance  Patient tolerated treatment well    Behavior During Therapy  WFL for tasks assessed/performed       Past Medical History:  Diagnosis Date  . Anemia    resolved from the beginning of this year  . Atrial fibrillation [facility])    cardioversion March 2004  . Cholelithiasis   . Chronic gastritis   . CKD (chronic kidney disease)    chronic kidney disease stage III  . Diverticulosis   . 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 DJD (degenerative joint disease)   . Elbow injury   . Fibromyalgia   . HH (hiatus hernia)   . Hypercholesterolemia    controlled with meds  . Hypertension    controlled with meds, checks regularly  . Liver cyst   . Osteopenia   . PPD positive   . Renal insufficiency   . Sleep apnea     Past Surgical History:  Procedure Laterality Date  . ADENOIDECTOMY    . Adenomatous polyp of duodenum    . APPENDECTOMY    . BACK SURGERY    . BILATERAL OOPHORECTOMY Right   . BREAST BIOPSY Right    1988 negative  . BREAST BIOPSY Right 09/25/2015   stereo,benign  . BREAST EXCISIONAL BIOPSY Left    ? when,benign  . CHOLECYSTECTOMY    . DILATION AND CURETTAGE OF UTERUS  1966  . ESOPHAGOGASTRODUODENOSCOPY (EGD) WITH  PROPOFOL N/A 07/31/2015   Procedure: ESOPHAGOGASTRODUODENOSCOPY (EGD) WITH PROPOFOL;  Surgeon: [name], MD;  Location: [facility] ENDOSCOPY;  Service: Endoscopy;  Laterality: N/A;  . ESOPHAGOGASTRODUODENOSCOPY (EGD) WITH PROPOFOL N/A 05/06/2016   Procedure: ESOPHAGOGASTRODUODENOSCOPY (EGD) WITH PROPOFOL;  Surgeon: [name], MD;  Location: [facility] ENDOSCOPY;  Service: Endoscopy;  Laterality: N/A;  . ESOPHAGOGASTRODUODENOSCOPY (EGD) WITH PROPOFOL N/A 10/30/2017   Procedure: ESOPHAGOGASTRODUODENOSCOPY (EGD) WITH PROPOFOL;  Surgeon: [name], MD;  Location: [facility] ENDOSCOPY;  Service: Endoscopy;  Laterality: N/A;  . JOINT REPLACEMENT     total knee Dr. Sabra Heck with excessive postoperative bleeding  . RHIZOTOMY    . TONSILLECTOMY    . TOTAL KNEE ARTHROPLASTY Bilateral    2001,2006  . TUBAL LIGATION  1971    There were no vitals filed for this visit.  Subjective Assessment - 06/29/18 1311    Subjective  Pt states balance is so far so good.     Pertinent History  Balance/leg weakness. Chronic issue. 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 Also had bilateral knee replacements and pt was not able to get the bend back  in her knees.  January 2018, pt fainted. Had tests which revealed pt had a stroke a while ago. Pt husband said that he thinks it did not affect anyrhing.  Pt has difficulty getting up from a chair without help.  Had a fall around May 2019 when pt was in the shower.  Pt was trying to get up from her shower chair and her foot slipped. Did not injure anything.  No other falls since then. Pt husband also states that pt's head gets ahead of herself, meaning what her brain tells her feet to do is not fast enough to react.   Pt states fear of falling but does not keep her from doing activities or getting out of her home.    Pt states not having back, hip, or knee pain currently. Occasional back pain for the past 3 months.     Patient Stated Goals  Pt  expresses desire to get better. Wants to go up and down the stairs better.     Currently in Pain?  Other (Comment)   no complain of pain                              PT Education - 06/29/18 1327    Education Details  ther-ex    Person(s) Educated  Patient    Methods  Explanation;Tactile cues;Demonstration;Verbal cues    Comprehension  Returned demonstration;Verbalized understanding        Objective  MedbridgeAccess Code: [email or web address]   osteopenia   81degrees R, L knee flexion AROM at start of session   Therapeutic exercise   Seated R knee flexionPROM with PT 10x3 Then AAROM 10x3 with PT assist to end range Then seated L knee flexion AROM 10x  Able to achieve 90 degrees flexion afterwards  Sit <> stand from 24 inch high table 10x with bilateral UE to no UE assist  Stepping over 7 inch cones with on UE assist 5x3 each LE  Emphasis on foot clearance. Tendency for circumduction of back leg  SLS with bilateral UE to light touch assist  R LE 10x5 seconds   L LE 10x5 seconds  L toe taps onto treadmill platform to promote R LE balance, no UE assist, CGA  Forward 10x  Lateral 10x  Standing backwards L LE toe taps 10x3 CGA, bilateral UE to no UE assist  Difficult to perform for pt  Walking with [facility] with 180 degree turns 8x (4x each side).  Able to perform turns both directions in 4 seconds without LOB.    Continue working on improving ability to turn 180 degrees, LE strengthening, stair negotiation (need more knee flexion ROM) and single leg balance    Improved exercise technique, movement at target joints, use of target muscles after min to mod verbal, visual, tactile cues.    Continued working on R LE single leg balance secondary to tendency for less steadiness while on R LE stance phase at times during balance activities. Continued working on knee flexion and LE strengthening as well to promote ability to  perform standing tasks with less difficulty. Improved ability to turn 180 degrees both to the R and L consistently in 4 seconds using SPC and no LOB. Patient will benefit from continued skilled physical therapy services to improve balance, strength, decrease fall risk, improve function and ability to negotiate stairs. Pt tolerated session well without aggravation of symptoms.  PT Short Term Goals - 06/23/18 1211      PT SHORT TERM GOAL #1   Title  Patient will be independent with her HEP to promote ability to stand up from a chair, improve LE strength, and improve balance.     Baseline  Pt states not performing her HEP (consistently)    Time  3    Period  Weeks    Status  On-going    Target Date  07/15/18        PT Long Term Goals - 06/23/18 1212      PT LONG TERM GOAL #1   Title  Patient will improve bilateral knee flexion ROM by at least 12 degrees to promote ability to stand up from her shower chair with less difficulty to decrease fall risk     Baseline  Knee flexion AROM: 73 degrees R, 80 degrees L (04/28/2018); 90 degrees R, 91 L (05/31/2018); 90 degrees bilateral knee flexion AROM (06/23/2018)    Time  5    Period  Weeks    Status  Partially Met    Target Date  07/29/18      PT LONG TERM GOAL #2   Title  Patient will improve bilateral LE strength by at least 1/2 MMT grade to promote ability to perform standing tasks as well as decrease fall risk.      Time  5    Period  Weeks    Status  Partially Met   potentially met   Target Date  07/29/18      PT LONG TERM GOAL #3   Title  Patient will improve her Berg Balance score by at least 6 points as a demonstration of improved balance.     Baseline  35/56 (<46 suggests increased fall risk; 04/28/2018); 45/56 (05/31/2018)    Time  8    Period  Weeks    Status  Achieved    Target Date  06/24/18      PT LONG TERM GOAL #4   Title  Patient will improve her FOTO score by at least 8 points as a demonstration of  improved function.     Baseline  LE FOTO 49 (04/28/2018); 53 (05/31/2018); 52 (06/23/2018)    Time  5    Period  Weeks    Status  Partially Met    Target Date  07/29/18      PT LONG TERM GOAL #5   Title  Patient will improve her Dynamic Gait index score with SPC by at least 5 points as a demonstration of improved balance.     Baseline  DGI score: 10 (06/23/2018)    Time  5    Period  Weeks    Status  New    Target Date  07/29/18            Plan - 06/29/18 1328    Clinical Impression Statement  Continued working on R LE single leg balance secondary to tendency for less steadiness while on R LE stance phase at times during balance activities. Continued working on knee flexion and LE strengthening as well to promote ability to perform standing tasks with less difficulty. Improved ability to turn 180 degrees both to the R and L consistently in 4 seconds using SPC and no LOB. Patient will benefit from continued skilled physical therapy services to improve balance, strength, decrease fall risk, improve function and ability to negotiate stairs. Pt tolerated session well without aggravation of symptoms.  Rehab Potential  Fair    Clinical Impairments Affecting Rehab Potential  (-) age, chronicity of condition, weakness, limited bilateral knee flexion ROM, decreased balance; (+) good husband support    PT Frequency  2x / week    PT Duration  Other (comment)   5 weeks   PT Treatment/Interventions  Balance training;Neuromuscular re-education;Patient/family education;Manual techniques;Passive range of motion;Dry needling;Therapeutic activities;Therapeutic exercise;Gait training;Electrical Stimulation;Iontophoresis 48m/ml Dexamethasone;Aquatic Therapy    PT Next Visit Plan   knee flexion ROM, glute and quadriceps strengthening, balance training, manual techniques, modalities PRN    Consulted and Agree with Plan of Care  Patient       Patient will benefit from skilled therapeutic intervention in  order to improve the following deficits and impairments:  Postural dysfunction, Improper body mechanics, Difficulty walking, Decreased strength, Decreased range of motion, Decreased balance  Visit Diagnosis: Unsteadiness on feet  Muscle weakness (generalized)  History of falling  Difficulty in walking, not elsewhere classified     Problem List Patient Active Problem List   Diagnosis Date Noted  . Absolute anemia 06/26/2015  . [place] 06/26[place]  . Hepatic cyst 06/26/2015  . BP (high blood pressure) 06/26/2015  . HLD (hyperlipidemia) 06/26/2015  . H/O: osteoarthritis 06/26/2015  . Arthritis, degenerative 06/26/2015  . Chronic kidney disease (CKD), stage III (moderate) [place] 06/26/2015  . Osteopenia 06/26/2015  . Obstructive apnea 04/19/2015    MJoneen BoersPT, DPT   06/29/2018, 2:03 PM  [facility] [address] [place] [id number]: [phone]  Fax:  [phone] Name: Jaclyn ESQUEDAMRN: [id number] of Birth: [birth year]

## 2018-07-01 ENCOUNTER — Ambulatory Visit: Payer: Medicare Other

## 2018-07-01 DIAGNOSIS — R2681 Unsteadiness on feet: Secondary | ICD-10-CM

## 2018-07-01 DIAGNOSIS — Z9181 History of falling: Secondary | ICD-10-CM | POA: Diagnosis not present

## 2018-07-01 DIAGNOSIS — R262 Difficulty in walking, not elsewhere classified: Secondary | ICD-10-CM

## 2018-07-01 DIAGNOSIS — M6281 Muscle weakness (generalized): Secondary | ICD-10-CM

## 2018-07-01 NOTE — Therapy (Signed)
[place] [facility] [address], [place], [address] Phone: [phone]   Fax:  [phone]  Physical Therapy Treatment  Patient Details  Name: Jaclyn Galvan MRN: [id number] Date of Birth: 1941/04/24 Referring Provider (PT): Jaclyn High, MD   Encounter Date: 07/01/2018  PT End of Session - 07/01/18 1348    Visit Number  17    Number of Visits  27    Date for PT Re-Evaluation  07/29/18    Authorization Type  8    Authorization Time Period  of 10 progress report    PT Start Time  1348    PT Stop Time  1430    PT Time Calculation (min)  42 min    Equipment Utilized During Treatment  Gait belt   Pt SPC   Activity Tolerance  Patient tolerated treatment well    Behavior During Therapy  WFL for tasks assessed/performed       Past Medical History:  Diagnosis Date  . Anemia    resolved from the beginning of this year  . Atrial fibrillation [facility])    cardioversion March 2004  . Cholelithiasis   . Chronic gastritis   . CKD (chronic kidney disease)    chronic kidney disease stage III  . Diverticulosis   . 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 DJD (degenerative joint disease)   . Elbow injury   . Fibromyalgia   . HH (hiatus hernia)   . Hypercholesterolemia    controlled with meds  . Hypertension    controlled with meds, checks regularly  . Liver cyst   . Osteopenia   . PPD positive   . Renal insufficiency   . Sleep apnea     Past Surgical History:  Procedure Laterality Date  . ADENOIDECTOMY    . Adenomatous polyp of duodenum    . APPENDECTOMY    . BACK SURGERY    . BILATERAL OOPHORECTOMY Right   . BREAST BIOPSY Right    1988 negative  . BREAST BIOPSY Right 09/25/2015   stereo,benign  . BREAST EXCISIONAL BIOPSY Left    ? when,benign  . CHOLECYSTECTOMY    . DILATION AND CURETTAGE OF UTERUS  1966  . ESOPHAGOGASTRODUODENOSCOPY (EGD) WITH PROPOFOL N/A  07/31/2015   Procedure: ESOPHAGOGASTRODUODENOSCOPY (EGD) WITH PROPOFOL;  Surgeon: [name], MD;  Location: [facility] ENDOSCOPY;  Service: Endoscopy;  Laterality: N/A;  . ESOPHAGOGASTRODUODENOSCOPY (EGD) WITH PROPOFOL N/A 05/06/2016   Procedure: ESOPHAGOGASTRODUODENOSCOPY (EGD) WITH PROPOFOL;  Surgeon: [name], MD;  Location: [facility] ENDOSCOPY;  Service: Endoscopy;  Laterality: N/A;  . ESOPHAGOGASTRODUODENOSCOPY (EGD) WITH PROPOFOL N/A 10/30/2017   Procedure: ESOPHAGOGASTRODUODENOSCOPY (EGD) WITH PROPOFOL;  Surgeon: [name], MD;  Location: [facility] ENDOSCOPY;  Service: Endoscopy;  Laterality: N/A;  . JOINT REPLACEMENT     total knee Dr. Sabra Heck with excessive postoperative bleeding  . RHIZOTOMY    . TONSILLECTOMY    . TOTAL KNEE ARTHROPLASTY Bilateral    2001,2006  . TUBAL LIGATION  1971    There were no vitals filed for this visit.  Subjective Assessment - 07/01/18 1350    Subjective  Doing ok. No new complains.     Pertinent History  Balance/leg weakness. Chronic issue. 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 Also had bilateral knee replacements and pt was not able to get the bend back in her knees.  January 2018,  pt fainted. Had tests which revealed pt had a stroke a while ago. Pt husband said that he thinks it did not affect anyrhing.  Pt has difficulty getting up from a chair without help.  Had a fall around May 2019 when pt was in the shower.  Pt was trying to get up from her shower chair and her foot slipped. Did not injure anything.  No other falls since then. Pt husband also states that pt's head gets ahead of herself, meaning what her brain tells her feet to do is not fast enough to react.   Pt states fear of falling but does not keep her from doing activities or getting out of her home.    Pt states not having back, hip, or knee pain currently. Occasional back pain for the past 3 months.     Patient Stated Goals  Pt expresses desire to get  better. Wants to go up and down the stairs better.     Currently in Pain?  No/denies                               PT Education - 07/01/18 1415    Education Details  ther-ex    Person(s) Educated  Patient    Methods  Explanation;Demonstration;Tactile cues;Verbal cues    Comprehension  Returned demonstration;Verbalized understanding        Objective  MedbridgeAccess Code: [email or web address]   osteopenia   87degrees R, L knee flexion AROM at start of session   Therapeutic exercise   Seated R knee flexionPROM with PT 10x3 Then AAROM 10x3 with PT assist to end range Then seated L knee flexion AROM 10x             Sit <> stand from 23 inch (lower) Galvan table 10x with bilateral UE to no UE assist  Then 22 inches (lower) Galvan table 5x2 with bilateral UE to no UE assist    SLS with bilateral UE to light touch assist             R LE 10x5 seconds for 2 sets             L LE 10x5 seconds for 2 sets  Stepping over 7 inch cones with one UE assist 5x3 each LE             Emphasis on foot clearance. Tendency for circumduction of back leg  forward step up onto and over 4 inch step with one UE assist  CGA to min A 5x2 each direction  Difficulty stepping down with L LE due to limited R knee flexion and weakness  L toe taps onto treadmill platform to promote R LE balance, light touch to no UE assist, CGA             Forward 10x2             Lateral 10x2   Standing backwards L LE toe taps 10x3 CGA, bilateral UE to no UE assist             Difficult to perform for pt. LOB x 1 with Min A to recover    Improved exercise technique, movement at target joints, use of target muscles after min to mod verbal, visual, tactile cues.    Continued working on R knee flexion ROM to promote foot clearance when stepping over obstacles and decrease difficulty with sit <> stand. Continued working on LE strength  to improve balance as  well as ability to perform standing tasks. Difficulty with balance activities at times secondary to LE weakness R > L. Pt tolerated session well without aggravation of symptoms.        PT Short Term Goals - 06/23/18 1211      PT SHORT TERM GOAL #1   Title  Patient will be independent with her HEP to promote ability to stand up from a chair, improve LE strength, and improve balance.     Baseline  Pt states not performing her HEP (consistently)    Time  3    Period  Weeks    Status  On-going    Target Date  07/15/18        PT Long Term Goals - 06/23/18 1212      PT LONG TERM GOAL #1   Title  Patient will improve bilateral knee flexion ROM by at least 12 degrees to promote ability to stand up from her shower chair with less difficulty to decrease fall risk     Baseline  Knee flexion AROM: 73 degrees R, 80 degrees L (04/28/2018); 90 degrees R, 91 L (05/31/2018); 90 degrees bilateral knee flexion AROM (06/23/2018)    Time  5    Period  Weeks    Status  Partially Met    Target Date  07/29/18      PT LONG TERM GOAL #2   Title  Patient will improve bilateral LE strength by at least 1/2 MMT grade to promote ability to perform standing tasks as well as decrease fall risk.      Time  5    Period  Weeks    Status  Partially Met   potentially met   Target Date  07/29/18      PT LONG TERM GOAL #3   Title  Patient will improve her Berg Balance score by at least 6 points as a demonstration of improved balance.     Baseline  35/56 (<46 suggests increased fall risk; 04/28/2018); 45/56 (05/31/2018)    Time  8    Period  Weeks    Status  Achieved    Target Date  06/24/18      PT LONG TERM GOAL #4   Title  Patient will improve her FOTO score by at least 8 points as a demonstration of improved function.     Baseline  LE FOTO 49 (04/28/2018); 53 (05/31/2018); 52 (06/23/2018)    Time  5    Period  Weeks    Status  Partially Met    Target Date  07/29/18      PT LONG TERM GOAL #5   Title   Patient will improve her Dynamic Gait index score with SPC by at least 5 points as a demonstration of improved balance.     Baseline  DGI score: 10 (06/23/2018)    Time  5    Period  Weeks    Status  New    Target Date  07/29/18            Plan - 07/01/18 1415    Clinical Impression Statement  Continued working on R knee flexion ROM to promote foot clearance when stepping over obstacles and decrease difficulty with sit <> stand. Continued working on LE strength to improve balance as well as ability to perform standing tasks. Difficulty with balance activities at times secondary to LE weakness R > L. Pt tolerated session well without aggravation of symptoms.  Rehab Potential  Fair    Clinical Impairments Affecting Rehab Potential  (-) age, chronicity of condition, weakness, limited bilateral knee flexion ROM, decreased balance; (+) good husband support    PT Frequency  2x / week    PT Duration  Other (comment)   5 weeks   PT Treatment/Interventions  Balance training;Neuromuscular re-education;Patient/family education;Manual techniques;Passive range of motion;Dry needling;Therapeutic activities;Therapeutic exercise;Gait training;Electrical Stimulation;Iontophoresis 88m/ml Dexamethasone;Aquatic Therapy    PT Next Visit Plan   knee flexion ROM, glute and quadriceps strengthening, balance training, manual techniques, modalities PRN    Consulted and Agree with Plan of Care  Patient       Patient will benefit from skilled therapeutic intervention in order to improve the following deficits and impairments:  Postural dysfunction, Improper body mechanics, Difficulty walking, Decreased strength, Decreased range of motion, Decreased balance  Visit Diagnosis: Unsteadiness on feet  Muscle weakness (generalized)  History of falling  Difficulty in walking, not elsewhere classified     Problem List Patient Active Problem List   Diagnosis Date Noted  . Absolute anemia 06/26/2015  . A-fib  ([place] 06/26/2015  . Hepatic cyst 06/26/2015  . BP (Galvan blood pressure) 06/26/2015  . HLD (hyperlipidemia) 06/26/2015  . H/O: osteoarthritis 06/26/2015  . Arthritis, degenerative 06/26/2015  . Chronic kidney disease (CKD), stage III (moderate) ([place] 06/26/2015  . Osteopenia 06/26/2015  . Obstructive apnea 04/19/2015    MJoneen BoersPT, DPT   07/01/2018, 10:05 PM  [facility] [address] [place] [address]Phone: [phone]  Fax:  [phone] Name: EVINEY ACOCELLAMRN: [id number] of Birth: [birth year]

## 2018-07-06 ENCOUNTER — Ambulatory Visit: Payer: Medicare Other | Attending: Internal Medicine

## 2018-07-06 DIAGNOSIS — R2681 Unsteadiness on feet: Secondary | ICD-10-CM | POA: Insufficient documentation

## 2018-07-06 DIAGNOSIS — Z9181 History of falling: Secondary | ICD-10-CM | POA: Insufficient documentation

## 2018-07-06 DIAGNOSIS — M6281 Muscle weakness (generalized): Secondary | ICD-10-CM | POA: Diagnosis present

## 2018-07-06 DIAGNOSIS — R262 Difficulty in walking, not elsewhere classified: Secondary | ICD-10-CM | POA: Insufficient documentation

## 2018-07-06 NOTE — Therapy (Signed)
[place] [facility] [address] [place], [address] Phone: [phone]   Fax:  [phone]  Physical Therapy Treatment  Patient Details  Name: Jaclyn Galvan MRN: [id number] Date of Birth: 1941/03/03 Referring Provider (PT): Tama High, MD   Encounter Date: 07/06/2018  PT End of Session - 07/07/18 1046    Visit Number  18    Number of Visits  27    Date for PT Re-Evaluation  07/29/18    Authorization Type  9    Authorization Time Period  of 10 progress report    PT Start Time  1515    PT Stop Time  1600    PT Time Calculation (min)  45 min    Equipment Utilized During Treatment  Gait belt   Pt SPC   Activity Tolerance  Patient tolerated treatment well    Behavior During Therapy  WFL for tasks assessed/performed       Past Medical History:  Diagnosis Date  . Anemia    resolved from the beginning of this year  . Atrial fibrillation [facility])    cardioversion March 2004  . Cholelithiasis   . Chronic gastritis   . CKD (chronic kidney disease)    chronic kidney disease stage III  . Diverticulosis   . 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 DJD (degenerative joint disease)   . Elbow injury   . Fibromyalgia   . HH (hiatus hernia)   . Hypercholesterolemia    controlled with meds  . Hypertension    controlled with meds, checks regularly  . Liver cyst   . Osteopenia   . PPD positive   . Renal insufficiency   . Sleep apnea     Past Surgical History:  Procedure Laterality Date  . ADENOIDECTOMY    . Adenomatous polyp of duodenum    . APPENDECTOMY    . BACK SURGERY    . BILATERAL OOPHORECTOMY Right   . BREAST BIOPSY Right    1988 negative  . BREAST BIOPSY Right 09/25/2015   stereo,benign  . BREAST EXCISIONAL BIOPSY Left    ? when,benign  . CHOLECYSTECTOMY    . DILATION AND CURETTAGE OF UTERUS  1966  . ESOPHAGOGASTRODUODENOSCOPY (EGD) WITH PROPOFOL N/A  07/31/2015   Procedure: ESOPHAGOGASTRODUODENOSCOPY (EGD) WITH PROPOFOL;  Surgeon: [name], MD;  Location: [facility] ENDOSCOPY;  Service: Endoscopy;  Laterality: N/A;  . ESOPHAGOGASTRODUODENOSCOPY (EGD) WITH PROPOFOL N/A 05/06/2016   Procedure: ESOPHAGOGASTRODUODENOSCOPY (EGD) WITH PROPOFOL;  Surgeon: [name], MD;  Location: [facility] ENDOSCOPY;  Service: Endoscopy;  Laterality: N/A;  . ESOPHAGOGASTRODUODENOSCOPY (EGD) WITH PROPOFOL N/A 10/30/2017   Procedure: ESOPHAGOGASTRODUODENOSCOPY (EGD) WITH PROPOFOL;  Surgeon: [name], MD;  Location: [facility] ENDOSCOPY;  Service: Endoscopy;  Laterality: N/A;  . JOINT REPLACEMENT     total knee Dr. Sabra Heck with excessive postoperative bleeding  . RHIZOTOMY    . TONSILLECTOMY    . TOTAL KNEE ARTHROPLASTY Bilateral    2001,2006  . TUBAL LIGATION  1971    There were no vitals filed for this visit.  Subjective Assessment - 07/06/18 1525    Subjective  Doing ok. No new complains. No acute pain currently    Pertinent History  Balance/leg weakness. Chronic issue. 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 Also had bilateral knee replacements and pt was not able to get the bend back in her knees.  January 2018, pt fainted. Had tests which revealed pt had a stroke a while ago. Pt husband said that he thinks it did not affect anyrhing.  Pt has difficulty getting up from a chair without help.  Had a fall around May 2019 when pt was in the shower.  Pt was trying to get up from her shower chair and her foot slipped. Did not injure anything.  No other falls since then. Pt husband also states that pt's head gets ahead of herself, meaning what her brain tells her feet to do is not fast enough to react.   Pt states fear of falling but does not keep her from doing activities or getting out of her home.    Pt states not having back, hip, or knee pain currently. Occasional back pain for the past 3 months.     Patient Stated Goals  Pt  expresses desire to get better. Wants to go up and down the stairs better.     Currently in Pain?  No/denies         TREATMENT  Therapeutic exercise 88degrees R knee flexion AROM at start of session; Seated R knee flexionPROM with PT 10s hold x 5; Sit <> stand from 22 inch mat table 2 x 10 with no UE assist; SLS with bilateral UE to light touch assist 5s hold x 10 bilateral; Stepping forward and backward over 1/2 foam roller x 10 on each side; Forward step up onto 4 inch step with no UE assist alternating LE x 10 each; Side step up and over 4" step x 5 each direction; Gait with ball toss horizontally to therapist 20' x multiple bouts; Gait with horizontal ball pass over shoulder to therapist with return catch over opposite shoulder varying height from waist to shoulder 20' x multiple bouts each direction;   Pt educated throughout session about proper posture and technique with exercises. Improved exercise technique, movement at target joints, use of target muscles after min to mod verbal, visual, tactile cues.    Pt demonstrates good motivation during session without aggravation of symptoms. She continues to struggle with balance during quick, reactive head turns. She has to limit her step-ups due to increase in knee pain. Pt encouraged to continue HEP and follow-up as scheduled.                          PT Short Term Goals - 06/23/18 1211      PT SHORT TERM GOAL #1   Title  Patient will be independent with her HEP to promote ability to stand up from a chair, improve LE strength, and improve balance.     Baseline  Pt states not performing her HEP (consistently)    Time  3    Period  Weeks    Status  On-going    Target Date  07/15/18        PT Long Term Goals - 06/23/18 1212      PT LONG TERM GOAL #1   Title  Patient will improve bilateral knee flexion ROM by at least 12 degrees to promote ability to stand up from her shower chair with less  difficulty to decrease fall risk     Baseline  Knee flexion AROM: 73 degrees R, 80 degrees L (04/28/2018); 90 degrees R, 91 L (05/31/2018); 90 degrees bilateral knee flexion AROM (06/23/2018)    Time  5    Period  Weeks    Status  Partially Met    Target Date  07/29/18      PT LONG TERM GOAL #2   Title  Patient will improve bilateral LE strength by at least 1/2 MMT grade to promote ability to perform standing tasks as well as decrease fall risk.      Time  5    Period  Weeks    Status  Partially Met   potentially met   Target Date  07/29/18      PT LONG TERM GOAL #3   Title  Patient will improve her Berg Balance score by at least 6 points as a demonstration of improved balance.     Baseline  35/56 (<46 suggests increased fall risk; 04/28/2018); 45/56 (05/31/2018)    Time  8    Period  Weeks    Status  Achieved    Target Date  06/24/18      PT LONG TERM GOAL #4   Title  Patient will improve her FOTO score by at least 8 points as a demonstration of improved function.     Baseline  LE FOTO 49 (04/28/2018); 53 (05/31/2018); 52 (06/23/2018)    Time  5    Period  Weeks    Status  Partially Met    Target Date  07/29/18      PT LONG TERM GOAL #5   Title  Patient will improve her Dynamic Gait index score with SPC by at least 5 points as a demonstration of improved balance.     Baseline  DGI score: 10 (06/23/2018)    Time  5    Period  Weeks    Status  New    Target Date  07/29/18            Plan - 07/07/18 1047    Clinical Impression Statement  Pt demonstrates good motivation during session without aggravation of symptoms. She continues to struggle with balance during quick, reactive head turns. She has to limit her step-ups due to increase in knee pain. Pt encouraged to continue HEP and follow-up as scheduled.     Rehab Potential  Fair    Clinical Impairments Affecting Rehab Potential  (-) age, chronicity of condition, weakness, limited bilateral knee flexion ROM, decreased balance;  (+) good husband support    PT Frequency  2x / week    PT Duration  Other (comment)   5 weeks   PT Treatment/Interventions  Balance training;Neuromuscular re-education;Patient/family education;Manual techniques;Passive range of motion;Dry needling;Therapeutic activities;Therapeutic exercise;Gait training;Electrical Stimulation;Iontophoresis 18m/ml Dexamethasone;Aquatic Therapy    PT Next Visit Plan   knee flexion ROM, glute and quadriceps strengthening, balance training, manual techniques, modalities PRN    Consulted and Agree with Plan of Care  Patient       Patient will benefit from skilled therapeutic intervention in order to improve the following deficits and impairments:  Postural dysfunction, Improper body mechanics, Difficulty walking, Decreased strength, Decreased range of motion, Decreased balance  Visit Diagnosis: Unsteadiness on feet  Muscle weakness (generalized)     Problem List Patient Active Problem List   Diagnosis Date Noted  . Absolute anemia 06/26/2015  . A-fib ([place] 06/26/2015  . Hepatic cyst 06/26/2015  . BP (high blood pressure) 06/26/2015  . HLD (hyperlipidemia) 06/26/2015  . H/O: osteoarthritis 06/26/2015  . Arthritis, degenerative 06/26/2015  . Chronic kidney disease (CKD), stage III (moderate) ([place] 06/26/2015  . Osteopenia 06/26/2015  . Obstructive apnea 04/19/2015   JLyndel SafeHuprich PT, DPT, GCS  Genesis Paget 07/07/2018, 10:50 AM  [place] [facility] [address] [place], [address] Phone: [phone]   Fax:  [phone]  Name: Jaclyn Galvan MRN: [id number] Date of Birth: [birth year]

## 2018-07-08 ENCOUNTER — Ambulatory Visit: Payer: Medicare Other

## 2018-07-08 DIAGNOSIS — R2681 Unsteadiness on feet: Secondary | ICD-10-CM | POA: Diagnosis not present

## 2018-07-08 DIAGNOSIS — M6281 Muscle weakness (generalized): Secondary | ICD-10-CM

## 2018-07-08 NOTE — Therapy (Addendum)
[facility] PHYS[facility] AND [facility] MEDICINE [address] [place], [place], [address] Phone: [phone]   Fax:  [phone]  Physical Therapy Treatment/Progress Note  Dates of reporting period  04/28/18   to   07/07/18  Patient Details  Name: Jaclyn Galvan MRN: [id number] Date of Birth: 09/09/1941 Referring Provider (PT): Bert J. Klein, MD   Encounter Date: 07/08/2018  PT End of Session - 07/08/18 1435    Visit Number  19    Number of Visits  27    Date for PT Re-Evaluation  07/29/18    Authorization Type  10    Authorization Time Period  of 10 progress report, next visit is 1/10    PT Start Time  1435    PT Stop Time  1520    PT Time Calculation (min)  45 min    Equipment Utilized During Treatment  Gait belt   Pt SPC   Activity Tolerance  Patient tolerated treatment well    Behavior During Therapy  WFL for tasks assessed/performed       Past Medical History:  Diagnosis Date  . Anemia    resolved from the beginning of this year  . Atrial fibrillation (HCC)    cardioversion March 2004  . Cholelithiasis   . Chronic gastritis   . CKD (chronic kidney disease)    chronic kidney disease stage III  . Diverticulosis   . 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 DJD (degenerative joint disease)   . Elbow injury   . Fibromyalgia   . HH (hiatus hernia)   . Hypercholesterolemia    controlled with meds  . Hypertension    controlled with meds, checks regularly  . Liver cyst   . Osteopenia   . PPD positive   . Renal insufficiency   . Sleep apnea     Past Surgical History:  Procedure Laterality Date  . ADENOIDECTOMY    . Adenomatous polyp of duodenum    . APPENDECTOMY    . BACK SURGERY    . BILATERAL OOPHORECTOMY Right   . BREAST BIOPSY Right    1988 negative  . BREAST BIOPSY Right 09/25/2015   stereo,benign  . BREAST EXCISIONAL BIOPSY Left    ? when,benign  . CHOLECYSTECTOMY    . DILATION  AND CURETTAGE OF UTERUS  1966  . ESOPHAGOGASTRODUODENOSCOPY (EGD) WITH PROPOFOL N/A 07/31/2015   Procedure: ESOPHAGOGASTRODUODENOSCOPY (EGD) WITH PROPOFOL;  Surgeon: Martin U Skulskie, MD;  Location: [facility] ENDOSCOPY;  Service: Endoscopy;  Laterality: N/A;  . ESOPHAGOGASTRODUODENOSCOPY (EGD) WITH PROPOFOL N/A 05/06/2016   Procedure: ESOPHAGOGASTRODUODENOSCOPY (EGD) WITH PROPOFOL;  Surgeon: Martin U Skulskie, MD;  Location: [facility] ENDOSCOPY;  Service: Endoscopy;  Laterality: N/A;  . ESOPHAGOGASTRODUODENOSCOPY (EGD) WITH PROPOFOL N/A 10/30/2017   Procedure: ESOPHAGOGASTRODUODENOSCOPY (EGD) WITH PROPOFOL;  Surgeon: Skulskie, Martin U, MD;  Location: [facility] ENDOSCOPY;  Service: Endoscopy;  Laterality: N/A;  . JOINT REPLACEMENT     total knee Dr. Miller with excessive postoperative bleeding  . RHIZOTOMY    . TONSILLECTOMY    . TOTAL KNEE ARTHROPLASTY Bilateral    2001,2006  . TUBAL LIGATION  1971    There were no vitals filed for this visit.  Subjective Assessment - 07/08/18 1434    Subjective  Pt reports that she is doing well on this date. She denies any pain currently. No specific questions or concerns at this time.     Pertinent History  Balance/leg weakness.   Chronic issue. 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 Also had bilateral knee replacements and pt was not able to get the bend back in her knees.  January 2018, pt fainted. Had tests which revealed pt had a stroke a while ago. Pt husband said that he thinks it did not affect anyrhing.  Pt has difficulty getting up from a chair without help.  Had a fall around May 2019 when pt was in the shower.  Pt was trying to get up from her shower chair and her foot slipped. Did not injure anything.  No other falls since then. Pt husband also states that pt's head gets ahead of herself, meaning what her brain tells her feet to do is not fast enough to react.   Pt states fear of falling but does not keep her from doing activities or  getting out of her home.    Pt states not having back, hip, or knee pain currently. Occasional back pain for the past 3 months.     Patient Stated Goals  Pt expresses desire to get better. Wants to go up and down the stairs better.     Currently in Pain?  No/denies            TREATMENT  Therapeutic exercise 88degrees R knee flexion AROM at start of session; Seated R knee flexionPROM with PT 10s hold x 5; Sit <> stand from 22 inch mat table x 10 then 21" mat table x 10 with no UE assist; Standing marching with 2# ankle weight (AW) 2 x 10 bilateral; Standing hip abduction with 2# AW 2 x 10 bilateral; Standing hip extension with 2# AW 2 x 10 bilateral; Standing HS curls with 2# AW 2 x 10 bilateral;  Neuromuscular Re-education  Airex balance with toe taps to 6" step x 10 each Stepping forward and backward over 6" foam roller x 10 on each side; Gait with ball toss horizontally to therapist 20' x multiple bouts; Gait with horizontal ball pass over shoulder to therapist with return catch over opposite shoulder varying height from waist to shoulder 20' x multiple bouts each direction; Single leg balance with contralateral LE on 1/2 foam roll (flat side up) x 30s bilateral Single leg balance practice x 30s bilateral in 2-3s increments;   Pt educated throughout session about proper posture and technique with exercises. Improved exercise technique, movement at target joints, use of target muscles after min to mod verbal, visual, tactile cues.    Pt demonstrates good motivation during session without aggravation of symptoms. Progressed sit to stand from lower table height and increased standing  She continues to struggle with balance during quick, reactive head turns and single leg balance is limited. Pt is continuing to make progress with strength and balance and she would benefit from additional skilled PT services to address deficits in strength and balance.                        PT Short Term Goals - 07/08/18 1542      PT SHORT TERM GOAL #1   Title  Patient will be independent with her HEP to promote ability to stand up from a chair, improve LE strength, and improve balance.     Baseline  Pt states not performing her HEP (consistently)    Time  3    Period  Weeks    Status  On-going    Target Date  07/15/18        PT Long Term Goals - 07/08/18 1543      PT LONG TERM GOAL #1   Title  Patient will improve bilateral knee flexion ROM by at least 12 degrees to promote ability to stand up from her shower chair with less difficulty to decrease fall risk     Baseline  Knee flexion AROM: 73 degrees R, 80 degrees L (04/28/2018); 90 degrees R, 91 L (05/31/2018); 90 degrees bilateral knee flexion AROM (06/23/2018)    Time  5    Period  Weeks    Status  Partially Met    Target Date  07/29/18      PT LONG TERM GOAL #2   Title  Patient will improve bilateral LE strength by at least 1/2 MMT grade to promote ability to perform standing tasks as well as decrease fall risk.      Time  5    Period  Weeks    Status  Partially Met   potentially met   Target Date  07/29/18      PT LONG TERM GOAL #3   Title  Patient will improve her Berg Balance score by at least 6 points as a demonstration of improved balance.     Baseline  35/56 (<46 suggests increased fall risk; 04/28/2018); 45/56 (05/31/2018)    Time  8    Period  Weeks    Status  Achieved      PT LONG TERM GOAL #4   Title  Patient will improve her FOTO score by at least 8 points as a demonstration of improved function.     Baseline  LE FOTO 49 (04/28/2018); 53 (05/31/2018); 52 (06/23/2018)    Time  5    Period  Weeks    Status  Partially Met    Target Date  07/29/18      PT LONG TERM GOAL #5   Title  Patient will improve her Dynamic Gait index score with SPC by at least 5 points as a demonstration of improved balance.     Baseline  DGI score: 10 (06/23/2018)    Time  5     Period  Weeks    Status  New    Target Date  07/29/18            Plan - 07/08/18 1435    Clinical Impression Statement  Pt demonstrates good motivation during session without aggravation of symptoms. Progressed sit to stand from lower table height and increased standing  She continues to struggle with balance during quick, reactive head turns and single leg balance is limited. Pt is continuing to make progress with strength and balance and she would benefit from additional skilled PT services to address deficits in strength and balance.     Rehab Potential  Fair    Clinical Impairments Affecting Rehab Potential  (-) age, chronicity of condition, weakness, limited bilateral knee flexion ROM, decreased balance; (+) good husband support    PT Frequency  2x / week    PT Duration  Other (comment)   5 weeks   PT Treatment/Interventions  Balance training;Neuromuscular re-education;Patient/family education;Manual techniques;Passive range of motion;Dry needling;Therapeutic activities;Therapeutic exercise;Gait training;Electrical Stimulation;Iontophoresis 4mg/ml Dexamethasone;Aquatic Therapy    PT Next Visit Plan   knee flexion ROM, glute and quadriceps strengthening, balance training, manual techniques, modalities PRN    Consulted and Agree with Plan of Care  Patient       Patient will benefit from skilled therapeutic intervention   in order to improve the following deficits and impairments:  Postural dysfunction, Improper body mechanics, Difficulty walking, Decreased strength, Decreased range of motion, Decreased balance  Visit Diagnosis: Unsteadiness on feet  Muscle weakness (generalized)     Problem List Patient Active Problem List   Diagnosis Date Noted  . Absolute anemia 06/26/2015  . A-fib (HCC) 06/26/2015  . Hepatic cyst 06/26/2015  . BP (high blood pressure) 06/26/2015  . HLD (hyperlipidemia) 06/26/2015  . H/O: osteoarthritis 06/26/2015  . Arthritis, degenerative 06/26/2015   . Chronic kidney disease (CKD), stage III (moderate) (HCC) 06/26/2015  . Osteopenia 06/26/2015  . Obstructive apnea 04/19/2015   Jason D Huprich PT, DPT, GCS  Huprich,Jason 07/08/2018, 3:51 PM  [facility] PHYSICAL AND SPORTS MEDICINE [address] [place], [place], [address] Phone: [phone]   Fax:  [phone]  Name: Jaclyn Galvan MRN: [id number] Date of Birth: 11/09/1940   

## 2018-07-14 ENCOUNTER — Ambulatory Visit: Payer: Medicare Other

## 2018-07-14 DIAGNOSIS — R262 Difficulty in walking, not elsewhere classified: Secondary | ICD-10-CM

## 2018-07-14 DIAGNOSIS — M6281 Muscle weakness (generalized): Secondary | ICD-10-CM

## 2018-07-14 DIAGNOSIS — R2681 Unsteadiness on feet: Secondary | ICD-10-CM | POA: Diagnosis not present

## 2018-07-14 DIAGNOSIS — Z9181 History of falling: Secondary | ICD-10-CM

## 2018-07-14 NOTE — Therapy (Signed)
[facility] PHYSICAL AND SPORTS MEDICINE [address] [place], [address] Phone: [phone]   Fax:  [phone]  Physical Therapy Treatment  Patient Details  Name: Jaclyn Galvan MRN: [id number] Date of Birth: August 20, 1941 Referring Provider (PT): Tama High, MD   Encounter Date: 07/14/2018  PT End of Session - 07/14/18 1305    Visit Number  20    Number of Visits  27    Date for PT Re-Evaluation  07/29/18    Authorization Type  1    Authorization Time Period  of 10 progress report, next visit is 1/10    PT Start Time  1305    PT Stop Time  1345    PT Time Calculation (min)  40 min    Equipment Utilized During Treatment  Gait belt   Pt SPC   Activity Tolerance  Patient tolerated treatment well    Behavior During Therapy  [facility] for tasks assessed/performed       Past Medical History:  Diagnosis Date  . Anemia    resolved from the beginning of this year  . Atrial fibrillation [facility])    cardioversion March 2004  . Cholelithiasis   . Chronic gastritis   . CKD (chronic kidney disease)    chronic kidney disease stage III  . Diverticulosis   . 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 DJD (degenerative joint disease)   . Elbow injury   . Fibromyalgia   . HH (hiatus hernia)   . Hypercholesterolemia    controlled with meds  . Hypertension    controlled with meds, checks regularly  . Liver cyst   . Osteopenia   . PPD positive   . Renal insufficiency   . Sleep apnea     Past Surgical History:  Procedure Laterality Date  . ADENOIDECTOMY    . Adenomatous polyp of duodenum    . APPENDECTOMY    . BACK SURGERY    . BILATERAL OOPHORECTOMY Right   . BREAST BIOPSY Right    1988 negative  . BREAST BIOPSY Right 09/25/2015   stereo,benign  . BREAST EXCISIONAL BIOPSY Left    ? when,benign  . CHOLECYSTECTOMY    . DILATION AND CURETTAGE OF UTERUS  1966  . ESOPHAGOGASTRODUODENOSCOPY (EGD)  WITH PROPOFOL N/A 07/31/2015   Procedure: ESOPHAGOGASTRODUODENOSCOPY (EGD) WITH PROPOFOL;  Surgeon: [name], MD;  Location: [facility] ENDOSCOPY;  Service: Endoscopy;  Laterality: N/A;  . ESOPHAGOGASTRODUODENOSCOPY (EGD) WITH PROPOFOL N/A 05/06/2016   Procedure: ESOPHAGOGASTRODUODENOSCOPY (EGD) WITH PROPOFOL;  Surgeon: [name], MD;  Location: [facility] ENDOSCOPY;  Service: Endoscopy;  Laterality: N/A;  . ESOPHAGOGASTRODUODENOSCOPY (EGD) WITH PROPOFOL N/A 10/30/2017   Procedure: ESOPHAGOGASTRODUODENOSCOPY (EGD) WITH PROPOFOL;  Surgeon: [name], MD;  Location: [facility] ENDOSCOPY;  Service: Endoscopy;  Laterality: N/A;  . JOINT REPLACEMENT     total knee Dr. Sabra Heck with excessive postoperative bleeding  . RHIZOTOMY    . TONSILLECTOMY    . TOTAL KNEE ARTHROPLASTY Bilateral    2001,2006  . TUBAL LIGATION  1971    There were no vitals filed for this visit.  Subjective Assessment - 07/14/18 1306    Subjective  Doing good today. Balance is about the same. No falls since the time she tried standing of from the shower chair. Pt later mentions that the last time she ate was yesterday, due to her diet that she has been doing for the past couple of  months with her daughter. Denies diabeties. States that she does not have an appetite.     Pertinent History  Balance/leg weakness. Chronic issue. 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 Also had bilateral knee replacements and pt was not able to get the bend back in her knees.  January 2018, pt fainted. Had tests which revealed pt had a stroke a while ago. Pt husband said that he thinks it did not affect anyrhing.  Pt has difficulty getting up from a chair without help.  Had a fall around May 2019 when pt was in the shower.  Pt was trying to get up from her shower chair and her foot slipped. Did not injure anything.  No other falls since then. Pt husband also states that pt's head gets ahead of herself, meaning what her  brain tells her feet to do is not fast enough to react.   Pt states fear of falling but does not keep her from doing activities or getting out of her home.    Pt states not having back, hip, or knee pain currently. Occasional back pain for the past 3 months.     Patient Stated Goals  Pt expresses desire to get better. Wants to go up and down the stairs better.     Currently in Pain?  No/denies                               PT Education - 07/14/18 1322    Education Details  ther-ex    Person(s) Educated  Patient    Methods  Explanation;Demonstration;Tactile cues;Verbal cues    Comprehension  Returned demonstration;Verbalized understanding       Objectives  Therapeutic exercise 85degrees R knee flexion AROM at start of session  Seated R knee flexionPROM with PT 10x3  Then AAROM 10x3   90 degrees R knee flexion afterwards  Sit <> stand from22 inch mat table x 10 then 21" mat table x 10 with no UE assist. Pt able to perform independently. Pt states that it is an accomplishment for her.   Forward step onto and over 4 inch step with R LE and one UE assist 4x, then 5x   Gait with SPC and 180 degree turns 4x each direction. Able to perform turns in 3 seconds with SPC on average.   SLS with light touch assist   R 10x5 seconds for 2 sets  L 10x5 seconds for 2 sets  Forward step over 7 inch cones with one UE assist 5x2 each LE Emphasis on foot clearance.   Pt was recommended to talk to her doctor about her diet due to only eating one meal a day.   Improved exercise technique, movement at target joints, use of target muscles after min to mod verbal, visual, tactile cues.    Continued working on improving R knee flexion ROM and LE strength to decrease difficulty standing up from a seated position as well as to improve balance. Better able to stand up from lower surfaces with pt being able to consistently perform sit <> stand from a 21 inch high surface  without UE assist. Improved ability to turn around 180 degrees with SPC safely without LOB while walking, with pt averaging 3 seconds to turn.          PT Short Term Goals - 07/08/18 1542      PT SHORT TERM GOAL #1  Title  Patient will be independent with her HEP to promote ability to stand up from a chair, improve LE strength, and improve balance.     Baseline  Pt states not performing her HEP (consistently)    Time  3    Period  Weeks    Status  On-going    Target Date  07/15/18        PT Long Term Goals - 07/08/18 1543      PT LONG TERM GOAL #1   Title  Patient will improve bilateral knee flexion ROM by at least 12 degrees to promote ability to stand up from her shower chair with less difficulty to decrease fall risk     Baseline  Knee flexion AROM: 73 degrees R, 80 degrees L (04/28/2018); 90 degrees R, 91 L (05/31/2018); 90 degrees bilateral knee flexion AROM (06/23/2018)    Time  5    Period  Weeks    Status  Partially Met    Target Date  07/29/18      PT LONG TERM GOAL #2   Title  Patient will improve bilateral LE strength by at least 1/2 MMT grade to promote ability to perform standing tasks as well as decrease fall risk.      Time  5    Period  Weeks    Status  Partially Met   potentially met   Target Date  07/29/18      PT LONG TERM GOAL #3   Title  Patient will improve her Berg Balance score by at least 6 points as a demonstration of improved balance.     Baseline  35/56 (<46 suggests increased fall risk; 04/28/2018); 45/56 (05/31/2018)    Time  8    Period  Weeks    Status  Achieved      PT LONG TERM GOAL #4   Title  Patient will improve her FOTO score by at least 8 points as a demonstration of improved function.     Baseline  LE FOTO 49 (04/28/2018); 53 (05/31/2018); 52 (06/23/2018)    Time  5    Period  Weeks    Status  Partially Met    Target Date  07/29/18      PT LONG TERM GOAL #5   Title  Patient will improve her Dynamic Gait index score with SPC by  at least 5 points as a demonstration of improved balance.     Baseline  DGI score: 10 (06/23/2018)    Time  5    Period  Weeks    Status  New    Target Date  07/29/18            Plan - 07/14/18 1323    Clinical Impression Statement  Continued working on improving R knee flexion ROM and LE strength to decrease difficulty standing up from a seated position as well as to improve balance. Better able to stand up from lower surfaces with pt being able to consistently perform sit <> stand from a 21 inch high surface without UE assist. Improved ability to turn around 180 degrees with SPC safely without LOB while walking, with pt averaging 3 seconds to turn.     Rehab Potential  Fair    Clinical Impairments Affecting Rehab Potential  (-) age, chronicity of condition, weakness, limited bilateral knee flexion ROM, decreased balance; (+) good husband support    PT Frequency  2x / week    PT Duration  Other (comment)  5 weeks   PT Treatment/Interventions  Balance training;Neuromuscular re-education;Patient/family education;Manual techniques;Passive range of motion;Dry needling;Therapeutic activities;Therapeutic exercise;Gait training;Electrical Stimulation;Iontophoresis 78m/ml Dexamethasone;Aquatic Therapy    PT Next Visit Plan   knee flexion ROM, glute and quadriceps strengthening, balance training, manual techniques, modalities PRN    Consulted and Agree with Plan of Care  Patient       Patient will benefit from skilled therapeutic intervention in order to improve the following deficits and impairments:  Postural dysfunction, Improper body mechanics, Difficulty walking, Decreased strength, Decreased range of motion, Decreased balance  Visit Diagnosis: Unsteadiness on feet  Muscle weakness (generalized)  History of falling  Difficulty in walking, not elsewhere classified     Problem List Patient Active Problem List   Diagnosis Date Noted  . Absolute anemia 06/26/2015  . A-fib ([place]  06/26/2015  . Hepatic cyst 06/26/2015  . BP (high blood pressure) 06/26/2015  . HLD (hyperlipidemia) 06/26/2015  . H/O: osteoarthritis 06/26/2015  . Arthritis, degenerative 06/26/2015  . Chronic kidney disease (CKD), stage III (moderate) ([place] 06/26/2015  . Osteopenia 06/26/2015  . Obstructive apnea 04/19/2015   MJoneen BoersPT, DPT   07/14/2018, 3:16 PM  [facility] [address] [place] [address]Phone: [phone]  Fax:  [phone] Name: Jaclyn PORCHEMRN: [id number] of Birth: [birth year]

## 2018-07-19 ENCOUNTER — Ambulatory Visit: Payer: Medicare Other

## 2018-07-19 DIAGNOSIS — M6281 Muscle weakness (generalized): Secondary | ICD-10-CM

## 2018-07-19 DIAGNOSIS — Z9181 History of falling: Secondary | ICD-10-CM

## 2018-07-19 DIAGNOSIS — R2681 Unsteadiness on feet: Secondary | ICD-10-CM | POA: Diagnosis not present

## 2018-07-19 DIAGNOSIS — R262 Difficulty in walking, not elsewhere classified: Secondary | ICD-10-CM

## 2018-07-19 NOTE — Therapy (Signed)
[place] [facility] [address] [place], [address] Phone: [phone]   Fax:  [phone]  Physical Therapy Treatment  Patient Details  Name: Jaclyn Galvan MRN: [id number] Date of Birth: Apr 12, 1941 Referring Provider (PT): Tama High, MD   Encounter Date: 07/19/2018  PT End of Session - 07/19/18 1312    Visit Number  22    Number of Visits  27    Date for PT Re-Evaluation  07/29/18    Authorization Type  2    Authorization Time Period  of 10 progress report    PT Start Time  3614   pt arrived late   PT Stop Time  1345    PT Time Calculation (min)  32 min    Equipment Utilized During Treatment  Gait belt   Pt SPC   Activity Tolerance  Patient tolerated treatment well    Behavior During Therapy  WFL for tasks assessed/performed       Past Medical History:  Diagnosis Date  . Anemia    resolved from the beginning of this year  . Atrial fibrillation [facility])    cardioversion March 2004  . Cholelithiasis   . Chronic gastritis   . CKD (chronic kidney disease)    chronic kidney disease stage III  . Diverticulosis   . 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 DJD (degenerative joint disease)   . Elbow injury   . Fibromyalgia   . HH (hiatus hernia)   . Hypercholesterolemia    controlled with meds  . Hypertension    controlled with meds, checks regularly  . Liver cyst   . Osteopenia   . PPD positive   . Renal insufficiency   . Sleep apnea     Past Surgical History:  Procedure Laterality Date  . ADENOIDECTOMY    . Adenomatous polyp of duodenum    . APPENDECTOMY    . BACK SURGERY    . BILATERAL OOPHORECTOMY Right   . BREAST BIOPSY Right    1988 negative  . BREAST BIOPSY Right 09/25/2015   stereo,benign  . BREAST EXCISIONAL BIOPSY Left    ? when,benign  . CHOLECYSTECTOMY    . DILATION AND CURETTAGE OF UTERUS  1966  . ESOPHAGOGASTRODUODENOSCOPY (EGD) WITH  PROPOFOL N/A 07/31/2015   Procedure: ESOPHAGOGASTRODUODENOSCOPY (EGD) WITH PROPOFOL;  Surgeon: [name], MD;  Location: [facility] ENDOSCOPY;  Service: Endoscopy;  Laterality: N/A;  . ESOPHAGOGASTRODUODENOSCOPY (EGD) WITH PROPOFOL N/A 05/06/2016   Procedure: ESOPHAGOGASTRODUODENOSCOPY (EGD) WITH PROPOFOL;  Surgeon: [name], MD;  Location: [facility] ENDOSCOPY;  Service: Endoscopy;  Laterality: N/A;  . ESOPHAGOGASTRODUODENOSCOPY (EGD) WITH PROPOFOL N/A 10/30/2017   Procedure: ESOPHAGOGASTRODUODENOSCOPY (EGD) WITH PROPOFOL;  Surgeon: [name], MD;  Location: [facility] ENDOSCOPY;  Service: Endoscopy;  Laterality: N/A;  . JOINT REPLACEMENT     total knee Dr. Sabra Heck with excessive postoperative bleeding  . RHIZOTOMY    . TONSILLECTOMY    . TOTAL KNEE ARTHROPLASTY Bilateral    2001,2006  . TUBAL LIGATION  1971    There were no vitals filed for this visit.  Subjective Assessment - 07/19/18 1314    Subjective  Pt states doing good today.  Last meal was yesterday.     Pertinent History  Balance/leg weakness. Chronic issue. 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 Also had bilateral knee replacements and pt was not able to get  the bend back in her knees.  January 2018, pt fainted. Had tests which revealed pt had a stroke a while ago. Pt husband said that he thinks it did not affect anyrhing.  Pt has difficulty getting up from a chair without help.  Had a fall around May 2019 when pt was in the shower.  Pt was trying to get up from her shower chair and her foot slipped. Did not injure anything.  No other falls since then. Pt husband also states that pt's head gets ahead of herself, meaning what her brain tells her feet to do is not fast enough to react.   Pt states fear of falling but does not keep her from doing activities or getting out of her home.    Pt states not having back, hip, or knee pain currently. Occasional back pain for the past 3 months.     Patient  Stated Goals  Pt expresses desire to get better. Wants to go up and down the stairs better.     Currently in Pain?  No/denies                               PT Education - 07/19/18 1314    Education Details  ther-ex    Person(s) Educated  Patient    Methods  Explanation;Demonstration;Tactile cues;Verbal cues    Comprehension  Returned demonstration;Verbalized understanding        Objectives  Therapeutic exercise 82degrees R knee flexion AROM at start of session  Seated R knee flexionPROM with PT 10x3             Then AAROM 10x3              85 degrees R knee flexion afterwards  Sit <> stand from22 inch mat table x 10then 21" mat table x 10with no UE assist. Pt able to perform independently.   SLS with light touch assist              R 10x5 seconds for 2 sets             L 10x5 seconds for 2 sets  Forward step over 7 inch cones with one UE assist 5x, then 8x each LE Emphasis on foot clearance   Improved exercise technique, movement at target joints, use of target muscles after mod verbal, visual, tactile cues.    Continued worlking on improving R knee flexion ROM and LE strength to promote ability to stand up from the seated position, improve foot clearance and improve balance. Pt tolerated session well without aggravation of symptoms.        PT Short Term Goals - 07/08/18 1542      PT SHORT TERM GOAL #1   Title  Patient will be independent with her HEP to promote ability to stand up from a chair, improve LE strength, and improve balance.     Baseline  Pt states not performing her HEP (consistently)    Time  3    Period  Weeks    Status  On-going    Target Date  07/15/18        PT Long Term Goals - 07/08/18 1543      PT LONG TERM GOAL #1   Title  Patient will improve bilateral knee flexion ROM by at least 12 degrees to promote ability to stand up from her shower chair with less difficulty to decrease fall risk  Baseline  Knee flexion AROM: 73 degrees R, 80 degrees L (04/28/2018); 90 degrees R, 91 L (05/31/2018); 90 degrees bilateral knee flexion AROM (06/23/2018)    Time  5    Period  Weeks    Status  Partially Met    Target Date  07/29/18      PT LONG TERM GOAL #2   Title  Patient will improve bilateral LE strength by at least 1/2 MMT grade to promote ability to perform standing tasks as well as decrease fall risk.      Time  5    Period  Weeks    Status  Partially Met   potentially met   Target Date  07/29/18      PT LONG TERM GOAL #3   Title  Patient will improve her Berg Balance score by at least 6 points as a demonstration of improved balance.     Baseline  35/56 (<46 suggests increased fall risk; 04/28/2018); 45/56 (05/31/2018)    Time  8    Period  Weeks    Status  Achieved      PT LONG TERM GOAL #4   Title  Patient will improve her FOTO score by at least 8 points as a demonstration of improved function.     Baseline  LE FOTO 49 (04/28/2018); 53 (05/31/2018); 52 (06/23/2018)    Time  5    Period  Weeks    Status  Partially Met    Target Date  07/29/18      PT LONG TERM GOAL #5   Title  Patient will improve her Dynamic Gait index score with SPC by at least 5 points as a demonstration of improved balance.     Baseline  DGI score: 10 (06/23/2018)    Time  5    Period  Weeks    Status  New    Target Date  07/29/18            Plan - 07/19/18 1311    Clinical Impression Statement  Continued worlking on improving R knee flexion ROM and LE strength to promote ability to stand up from the seated position, improve foot clearance and improve balance. Pt tolerated session well without aggravation of symptoms.     Rehab Potential  Fair    Clinical Impairments Affecting Rehab Potential  (-) age, chronicity of condition, weakness, limited bilateral knee flexion ROM, decreased balance; (+) good husband support    PT Frequency  2x / week    PT Duration  Other (comment)   5 weeks   PT  Treatment/Interventions  Balance training;Neuromuscular re-education;Patient/family education;Manual techniques;Passive range of motion;Dry needling;Therapeutic activities;Therapeutic exercise;Gait training;Electrical Stimulation;Iontophoresis 60m/ml Dexamethasone;Aquatic Therapy    PT Next Visit Plan   knee flexion ROM, glute and quadriceps strengthening, balance training, manual techniques, modalities PRN    Consulted and Agree with Plan of Care  Patient       Patient will benefit from skilled therapeutic intervention in order to improve the following deficits and impairments:  Postural dysfunction, Improper body mechanics, Difficulty walking, Decreased strength, Decreased range of motion, Decreased balance  Visit Diagnosis: Unsteadiness on feet  Muscle weakness (generalized)  History of falling  Difficulty in walking, not elsewhere classified     Problem List Patient Active Problem List   Diagnosis Date Noted  . Absolute anemia 06/26/2015  . [place]-fib ([place] 06/26/2015  . Hepatic cyst 06/26/2015  . BP (high blood pressure) 06/26/2015  . HLD (hyperlipidemia) 06/26/2015  . H/O: osteoarthritis  06/26/2015  . Arthritis, degenerative 06/26/2015  . Chronic kidney disease (CKD), stage III (moderate) ([place]) 06/26/2015  . Osteopenia 06/26/2015  . Obstructive apnea 04/19/2015    Joneen Boers PT, DPT   07/19/2018, 3:06 PM  [place] [facility] [address] [place], [address] Phone: [phone]   Fax:  [phone]  Name: Jaclyn Galvan MRN: [id number] Date of Birth: 08-03-1941

## 2018-07-21 ENCOUNTER — Ambulatory Visit: Payer: Medicare Other

## 2018-07-21 DIAGNOSIS — Z9181 History of falling: Secondary | ICD-10-CM

## 2018-07-21 DIAGNOSIS — M6281 Muscle weakness (generalized): Secondary | ICD-10-CM

## 2018-07-21 DIAGNOSIS — R2681 Unsteadiness on feet: Secondary | ICD-10-CM

## 2018-07-21 DIAGNOSIS — R262 Difficulty in walking, not elsewhere classified: Secondary | ICD-10-CM

## 2018-07-21 NOTE — Therapy (Signed)
[place] [facility] [address], [place], [address] Phone: [phone]   Fax:  [phone]  Physical Therapy Treatment  Patient Details  Name: Jaclyn Galvan MRN: [id number] Date of Birth: 10-27-1940 Referring Provider (PT): Tama High, MD   Encounter Date: 07/21/2018  PT End of Session - 07/21/18 1310    Visit Number  23    Number of Visits  27    Date for PT Re-Evaluation  07/29/18    Authorization Type  3    Authorization Time Period  of 10 progress report    PT Start Time  6389   pt arrived late   PT Stop Time  1354    PT Time Calculation (min)  43 min    Equipment Utilized During Treatment  Gait belt   Pt SPC   Activity Tolerance  Patient tolerated treatment well    Behavior During Therapy  WFL for tasks assessed/performed       Past Medical History:  Diagnosis Date  . Anemia    resolved from the beginning of this year  . Atrial fibrillation [facility])    cardioversion March 2004  . Cholelithiasis   . Chronic gastritis   . CKD (chronic kidney disease)    chronic kidney disease stage III  . Diverticulosis   . 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 DJD (degenerative joint disease)   . Elbow injury   . Fibromyalgia   . HH (hiatus hernia)   . Hypercholesterolemia    controlled with meds  . Hypertension    controlled with meds, checks regularly  . Liver cyst   . Osteopenia   . PPD positive   . Renal insufficiency   . Sleep apnea     Past Surgical History:  Procedure Laterality Date  . ADENOIDECTOMY    . Adenomatous polyp of duodenum    . APPENDECTOMY    . BACK SURGERY    . BILATERAL OOPHORECTOMY Right   . BREAST BIOPSY Right    1988 negative  . BREAST BIOPSY Right 09/25/2015   stereo,benign  . BREAST EXCISIONAL BIOPSY Left    ? when,benign  . CHOLECYSTECTOMY    . DILATION AND CURETTAGE OF UTERUS  1966  . ESOPHAGOGASTRODUODENOSCOPY (EGD) WITH  PROPOFOL N/A 07/31/2015   Procedure: ESOPHAGOGASTRODUODENOSCOPY (EGD) WITH PROPOFOL;  Surgeon: [name], MD;  Location: [facility] ENDOSCOPY;  Service: Endoscopy;  Laterality: N/A;  . ESOPHAGOGASTRODUODENOSCOPY (EGD) WITH PROPOFOL N/A 05/06/2016   Procedure: ESOPHAGOGASTRODUODENOSCOPY (EGD) WITH PROPOFOL;  Surgeon: [name], MD;  Location: [facility] ENDOSCOPY;  Service: Endoscopy;  Laterality: N/A;  . ESOPHAGOGASTRODUODENOSCOPY (EGD) WITH PROPOFOL N/A 10/30/2017   Procedure: ESOPHAGOGASTRODUODENOSCOPY (EGD) WITH PROPOFOL;  Surgeon: [name], MD;  Location: [facility] ENDOSCOPY;  Service: Endoscopy;  Laterality: N/A;  . JOINT REPLACEMENT     total knee Dr. Sabra Heck with excessive postoperative bleeding  . RHIZOTOMY    . TONSILLECTOMY    . TOTAL KNEE ARTHROPLASTY Bilateral    2001,2006  . TUBAL LIGATION  1971    There were no vitals filed for this visit.  Subjective Assessment - 07/21/18 1313    Subjective  Doing good. No pain or discomfort anywhere. Feels stronger and more steady on her feet. Has not fallen since starting PT.     Pertinent History  Balance/leg weakness. Chronic issue. 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 Also  had bilateral knee replacements and pt was not able to get the bend back in her knees.  January 2018, pt fainted. Had tests which revealed pt had a stroke a while ago. Pt husband said that he thinks it did not affect anyrhing.  Pt has difficulty getting up from a chair without help.  Had a fall around May 2019 when pt was in the shower.  Pt was trying to get up from her shower chair and her foot slipped. Did not injure anything.  No other falls since then. Pt husband also states that pt's head gets ahead of herself, meaning what her brain tells her feet to do is not fast enough to react.   Pt states fear of falling but does not keep her from doing activities or getting out of her home.    Pt states not having back, hip, or knee pain  currently. Occasional back pain for the past 3 months.     Patient Stated Goals  Pt expresses desire to get better. Wants to go up and down the stairs better.     Currently in Pain?  No/denies         [facility] PT Assessment - 07/21/18 1344      Dynamic Gait Index   Level Surface  Mild Impairment    Change in Gait Speed  Mild Impairment    Gait with Horizontal Head Turns  Moderate Impairment    Gait with Vertical Head Turns  Moderate Impairment    Gait and Pivot Turn  Normal    Step Over Obstacle  Severe Impairment    Step Around Obstacles  Normal    Steps  Mild Impairment    Total Score  14                           PT Education - 07/21/18 1350    Education Details  ther-ex, progress/current status    Person(s) Educated  Patient    Methods  Explanation;Demonstration;Tactile cues;Verbal cues    Comprehension  Returned demonstration;Verbalized understanding        Objectives  Therapeutic exercise 88degrees R knee flexion AROM at start of session  Seated R knee flexionPROM with PT10x3 Then AAROM 10x3  90 degrees R knee flexion afterwards  Sit <> stand from20 inch mat table x 10  then 19 inch mat table 5x2with B UE to stand, and no UE assist to sit (upgrade height)    Improving ability to stand up from lower surfaces  Directed patient with gait with normal gait speed, with changes in speed, 180 degree pivot turn, with R and L cervical rotation position, with cervical flexion and extension position, stepping around obstacles, stepping over an obstacle, ascending and descending 4 regular steps with UE assist   Reviewed progress/current status with DGI with pt.    Forward step over 7 inch cones with one UE assist 8x, then 5x each LE Emphasis on foot clearance     Improved exercise technique, movement at target joints, use of target muscles after min to mod verbal, visual, tactile cues.     Improving  functional LE strength as demonstrated with decreasing mat table height for sit <> stand exercises. Pt also demonstrates improved DGI score suggesting improved balances since last measured in September. Able to descend stairs today in a reciprocal pattern and able to turn 180 degrees while walking using  SPC in 3 seconds consistently. Continued working on stepping over cones  to promote foot clearance with obstacle negotiation to help decrease fall risk. Pt will benefit from continued skilled physical therapy services to improve LE strength, knee flexion ROM, balance and decrease fall risk.      PT Short Term Goals - 07/08/18 1542      PT SHORT TERM GOAL #1   Title  Patient will be independent with her HEP to promote ability to stand up from a chair, improve LE strength, and improve balance.     Baseline  Pt states not performing her HEP (consistently)    Time  3    Period  Weeks    Status  On-going    Target Date  07/15/18        PT Long Term Goals - 07/08/18 1543      PT LONG TERM GOAL #1   Title  Patient will improve bilateral knee flexion ROM by at least 12 degrees to promote ability to stand up from her shower chair with less difficulty to decrease fall risk     Baseline  Knee flexion AROM: 73 degrees R, 80 degrees L (04/28/2018); 90 degrees R, 91 L (05/31/2018); 90 degrees bilateral knee flexion AROM (06/23/2018)    Time  5    Period  Weeks    Status  Partially Met    Target Date  07/29/18      PT LONG TERM GOAL #2   Title  Patient will improve bilateral LE strength by at least 1/2 MMT grade to promote ability to perform standing tasks as well as decrease fall risk.      Time  5    Period  Weeks    Status  Partially Met   potentially met   Target Date  07/29/18      PT LONG TERM GOAL #3   Title  Patient will improve her Berg Balance score by at least 6 points as a demonstration of improved balance.     Baseline  35/56 (<46 suggests increased fall risk; 04/28/2018); 45/56  (05/31/2018)    Time  8    Period  Weeks    Status  Achieved      PT LONG TERM GOAL #4   Title  Patient will improve her FOTO score by at least 8 points as a demonstration of improved function.     Baseline  LE FOTO 49 (04/28/2018); 53 (05/31/2018); 52 (06/23/2018)    Time  5    Period  Weeks    Status  Partially Met    Target Date  07/29/18      PT LONG TERM GOAL #5   Title  Patient will improve her Dynamic Gait index score with SPC by at least 5 points as a demonstration of improved balance.     Baseline  DGI score: 10 (06/23/2018)    Time  5    Period  Weeks    Status  New    Target Date  07/29/18            Plan - 07/21/18 1310    Clinical Impression Statement  Improving functional LE strength as demonstrated with decreasing mat table height for sit <> stand exercises. Pt also demonstrates improved DGI score suggesting improved balances since last measured in September. Able to descend stairs today in a reciprocal pattern and able to turn 180 degrees while walking using  SPC in 3 seconds consistently. Continued working on stepping over cones to promote foot clearance with obstacle negotiation to help decrease  fall risk. Pt will benefit from continued skilled physical therapy services to improve LE strength, knee flexion ROM, balance and decrease fall risk.     Rehab Potential  Fair    Clinical Impairments Affecting Rehab Potential  (-) age, chronicity of condition, weakness, limited bilateral knee flexion ROM, decreased balance; (+) good husband support    PT Frequency  2x / week    PT Duration  Other (comment)   5 weeks   PT Treatment/Interventions  Balance training;Neuromuscular re-education;Patient/family education;Manual techniques;Passive range of motion;Dry needling;Therapeutic activities;Therapeutic exercise;Gait training;Electrical Stimulation;Iontophoresis 22m/ml Dexamethasone;Aquatic Therapy    PT Next Visit Plan   knee flexion ROM, glute and quadriceps strengthening,  balance training, manual techniques, modalities PRN    Consulted and Agree with Plan of Care  Patient       Patient will benefit from skilled therapeutic intervention in order to improve the following deficits and impairments:  Postural dysfunction, Improper body mechanics, Difficulty walking, Decreased strength, Decreased range of motion, Decreased balance  Visit Diagnosis: Unsteadiness on feet  Muscle weakness (generalized)  History of falling  Difficulty in walking, not elsewhere classified     Problem List Patient Active Problem List   Diagnosis Date Noted  . Absolute anemia 06/26/2015  . [place]fib ([place] 06/26/2015  . Hepatic cyst 06/26/2015  . BP (high blood pressure) 06/26/2015  . HLD (hyperlipidemia) 06/26/2015  . H/O: osteoarthritis 06/26/2015  . Arthritis, degenerative 06/26/2015  . Chronic kidney disease (CKD), stage III (moderate) [place] 06/26/2015  . Osteopenia 06/26/2015  . Obstructive apnea 04/19/2015    MJoneen BoersPT, DPT   07/21/2018, 2:02 PM  [facility] [address] [place] [id number]: [phone]  Fax:  [phone] Name: Jaclyn GARRODMRN: [id number] of Birth: [birth year]

## 2018-07-26 ENCOUNTER — Ambulatory Visit: Payer: Medicare Other

## 2018-07-26 DIAGNOSIS — Z9181 History of falling: Secondary | ICD-10-CM

## 2018-07-26 DIAGNOSIS — R2681 Unsteadiness on feet: Secondary | ICD-10-CM

## 2018-07-26 DIAGNOSIS — R262 Difficulty in walking, not elsewhere classified: Secondary | ICD-10-CM

## 2018-07-26 DIAGNOSIS — M6281 Muscle weakness (generalized): Secondary | ICD-10-CM

## 2018-07-26 NOTE — Therapy (Signed)
[facility] [address] [place], [address] Phone: [phone]   Fax:  [phone]  Physical Therapy Treatment  Patient Details  Name: Jaclyn Galvan MRN: [id number] Date of Birth: 1940/12/25 Referring Provider (PT): Tama High, MD   Encounter Date: 07/26/2018  PT End of Session - 07/26/18 1300    Visit Number  23    Number of Visits  27    Date for PT Re-Evaluation  07/29/18    Authorization Type  4    Authorization Time Period  of 10 progress report    PT Start Time  1301    PT Stop Time  1346    PT Time Calculation (min)  45 min    Equipment Utilized During Treatment  Gait belt   Pt SPC   Activity Tolerance  Patient tolerated treatment well    Behavior During Therapy  WFL for tasks assessed/performed       Past Medical History:  Diagnosis Date  . Anemia    resolved from the beginning of this year  . Atrial fibrillation [facility])    cardioversion March 2004  . Cholelithiasis   . Chronic gastritis   . CKD (chronic kidney disease)    chronic kidney disease stage III  . Diverticulosis   . 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 DJD (degenerative joint disease)   . Elbow injury   . Fibromyalgia   . HH (hiatus hernia)   . Hypercholesterolemia    controlled with meds  . Hypertension    controlled with meds, checks regularly  . Liver cyst   . Osteopenia   . PPD positive   . Renal insufficiency   . Sleep apnea     Past Surgical History:  Procedure Laterality Date  . ADENOIDECTOMY    . Adenomatous polyp of duodenum    . APPENDECTOMY    . BACK SURGERY    . BILATERAL OOPHORECTOMY Right   . BREAST BIOPSY Right    1988 negative  . BREAST BIOPSY Right 09/25/2015   stereo,benign  . BREAST EXCISIONAL BIOPSY Left    ? when,benign  . CHOLECYSTECTOMY    . DILATION AND CURETTAGE OF UTERUS  1966  . ESOPHAGOGASTRODUODENOSCOPY (EGD) WITH PROPOFOL N/A  07/31/2015   Procedure: ESOPHAGOGASTRODUODENOSCOPY (EGD) WITH PROPOFOL;  Surgeon: [name], MD;  Location: [facility] ENDOSCOPY;  Service: Endoscopy;  Laterality: N/A;  . ESOPHAGOGASTRODUODENOSCOPY (EGD) WITH PROPOFOL N/A 05/06/2016   Procedure: ESOPHAGOGASTRODUODENOSCOPY (EGD) WITH PROPOFOL;  Surgeon: [name], MD;  Location: [facility] ENDOSCOPY;  Service: Endoscopy;  Laterality: N/A;  . ESOPHAGOGASTRODUODENOSCOPY (EGD) WITH PROPOFOL N/A 10/30/2017   Procedure: ESOPHAGOGASTRODUODENOSCOPY (EGD) WITH PROPOFOL;  Surgeon: [name], MD;  Location: [facility] ENDOSCOPY;  Service: Endoscopy;  Laterality: N/A;  . JOINT REPLACEMENT     total knee Dr. Sabra Heck with excessive postoperative bleeding  . RHIZOTOMY    . TONSILLECTOMY    . TOTAL KNEE ARTHROPLASTY Bilateral    2001,2006  . TUBAL LIGATION  1971    There were no vitals filed for this visit.  Subjective Assessment - 07/26/18 1302    Subjective  Doing good. Pt states that she feels like she is doing well progressing towards her goals (improve ability to stand up from sitting, negotiate stairs, turn around more safely).  Feels like next session is a good time to graduate from PT.     Pertinent History  Balance/leg  weakness. Chronic issue. 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 Also had bilateral knee replacements and pt was not able to get the bend back in her knees.  January 2018, pt fainted. Had tests which revealed pt had a stroke a while ago. Pt husband said that he thinks it did not affect anyrhing.  Pt has difficulty getting up from a chair without help.  Had a fall around May 2019 when pt was in the shower.  Pt was trying to get up from her shower chair and her foot slipped. Did not injure anything.  No other falls since then. Pt husband also states that pt's head gets ahead of herself, meaning what her brain tells her feet to do is not fast enough to react.   Pt states fear of falling but does not keep her  from doing activities or getting out of her home.    Pt states not having back, hip, or knee pain currently. Occasional back pain for the past 3 months.     Patient Stated Goals  Pt expresses desire to get better. Wants to go up and down the stairs better.     Currently in Pain?  No/denies         [facility] PT Assessment - 07/26/18 1317      Observation/Other Assessments   Focus on Therapeutic Outcomes (FOTO)   50      Strength   Right Hip Flexion  5/5    Right Hip Extension  5/5   seated manually resisted hip extension isometrics   Right Hip ABduction  4+/5   seated clamshell isometrics   Left Hip Flexion  4+/5    Left Hip Extension  5/5   seated manually resisted hip extension isometrics   Left Hip ABduction  4+/5   seated clamshell isometrics   Right Knee Flexion  4+/5    Right Knee Extension  5/5    Left Knee Flexion  4+/5    Left Knee Extension  5/5                           PT Education - 07/26/18 1316    Education Details  ther-ex    Person(s) Educated  Patient    Methods  Explanation;Demonstration;Tactile cues;Verbal cues    Comprehension  Returned demonstration;Verbalized understanding      Objectives  Therapeutic exercise 81degrees R knee flexion AROM at start of session  Seated R knee flexionPROM with PT10x3 Then AAROM 10x3 85degrees R knee flexion afterwards  Sit <>stand from20 inch mat table x 10             then 19 inch mat table 5x2with B UE to stand, and no UE assist to sit  Seated manually resisted hip flexion, hip extension, clamshell isometrics, knee flexion, knee extension 1-2x each way for each LE  Reviewed progress/current status with LE strength with pt   Forward step over 7 inch cones with one UE assist 5xeach LE Emphasis on foot clearance  forward step up onto and over step with 1 riser using R LE to promote R knee bend when stepping down 5x2  Side stepping 5 ft to the R  and 5 ft to the L to promote glute med muscle strength.   Reviewed and given as part of her HEP. Pt demonstrated and verbalized understanding.    Improved exercise technique, movement at target joints, use of target muscles  after min to mod verbal, visual, tactile cues.   Pt demonstrates improved bilateral LE strength (improved all muscles tested except R knee flexion) since initial evaluation as well as improving functional LE strength with pt being able to stand up from lower surface with less difficulty (mat table 19 inches high). Continued working on stepping over 7 inch cones to promote foot clearance with obstacle negotiation to decrease fall risk as well as stepping down with L LE from regular step height to promote R knee flexion and improve ability to descend stairs. Pt tolerated session well without aggravation of symptoms.       PT Short Term Goals - 07/08/18 1542      PT SHORT TERM GOAL #1   Title  Patient will be independent with her HEP to promote ability to stand up from a chair, improve LE strength, and improve balance.     Baseline  Pt states not performing her HEP (consistently)    Time  3    Period  Weeks    Status  On-going    Target Date  07/15/18        PT Long Term Goals - 07/08/18 1543      PT LONG TERM GOAL #1   Title  Patient will improve bilateral knee flexion ROM by at least 12 degrees to promote ability to stand up from her shower chair with less difficulty to decrease fall risk     Baseline  Knee flexion AROM: 73 degrees R, 80 degrees L (04/28/2018); 90 degrees R, 91 L (05/31/2018); 90 degrees bilateral knee flexion AROM (06/23/2018)    Time  5    Period  Weeks    Status  Partially Met    Target Date  07/29/18      PT LONG TERM GOAL #2   Title  Patient will improve bilateral LE strength by at least 1/2 MMT grade to promote ability to perform standing tasks as well as decrease fall risk.      Time  5    Period  Weeks    Status  Partially Met    potentially met   Target Date  07/29/18      PT LONG TERM GOAL #3   Title  Patient will improve her Berg Balance score by at least 6 points as a demonstration of improved balance.     Baseline  35/56 (<46 suggests increased fall risk; 04/28/2018); 45/56 (05/31/2018)    Time  8    Period  Weeks    Status  Achieved      PT LONG TERM GOAL #4   Title  Patient will improve her FOTO score by at least 8 points as a demonstration of improved function.     Baseline  LE FOTO 49 (04/28/2018); 53 (05/31/2018); 52 (06/23/2018)    Time  5    Period  Weeks    Status  Partially Met    Target Date  07/29/18      PT LONG TERM GOAL #5   Title  Patient will improve her Dynamic Gait index score with SPC by at least 5 points as a demonstration of improved balance.     Baseline  DGI score: 10 (06/23/2018)    Time  5    Period  Weeks    Status  New    Target Date  07/29/18            Plan - 07/26/18 1257    Clinical Impression Statement  Pt demonstrates improved bilateral LE strength (improved all muscles tested except R knee flexion) since initial evaluation as well as improving functional LE strength with pt being able to stand up from lower surface with less difficulty (mat table 19 inches high). Continued working on stepping over 7 inch cones to promote foot clearance with obstacle negotiation to decrease fall risk as well as stepping down with L LE from regular step height to promote R knee flexion and improve ability to descend stairs. Pt tolerated session well without aggravation of symptoms.     Rehab Potential  Fair    Clinical Impairments Affecting Rehab Potential  (-) age, chronicity of condition, weakness, limited bilateral knee flexion ROM, decreased balance; (+) good husband support    PT Frequency  2x / week    PT Duration  Other (comment)   5 weeks   PT Treatment/Interventions  Balance training;Neuromuscular re-education;Patient/family education;Manual techniques;Passive range of  motion;Dry needling;Therapeutic activities;Therapeutic exercise;Gait training;Electrical Stimulation;Iontophoresis 67m/ml Dexamethasone;Aquatic Therapy    PT Next Visit Plan   knee flexion ROM, glute and quadriceps strengthening, balance training, manual techniques, modalities PRN    Consulted and Agree with Plan of Care  Patient       Patient will benefit from skilled therapeutic intervention in order to improve the following deficits and impairments:  Postural dysfunction, Improper body mechanics, Difficulty walking, Decreased strength, Decreased range of motion, Decreased balance  Visit Diagnosis: Unsteadiness on feet  Muscle weakness (generalized)  History of falling  Difficulty in walking, not elsewhere classified     Problem List Patient Active Problem List   Diagnosis Date Noted  . Absolute anemia 06/26/2015  . [place]-fib ([place] 06/26/2015  . Hepatic cyst 06/26/2015  . BP (high blood pressure) 06/26/2015  . HLD (hyperlipidemia) 06/26/2015  . H/O: osteoarthritis 06/26/2015  . Arthritis, degenerative 06/26/2015  . Chronic kidney disease (CKD), stage III (moderate) ([facility] 06/26/2015  . Osteopenia 06/26/2015  . Obstructive apnea 04/19/2015    MJoneen BoersPT, DPT   07/26/2018, 3:01 PM  [facility] [address] [place] [address]Phone: [phone]  Fax:  [phone] Name: Jaclyn GURRMRN: [id number] of Birth: 104-11-1940

## 2018-07-26 NOTE — Patient Instructions (Signed)
Side stepping    Step sideways to the right and to the left with hand hold assist onto something sturdy for safety. 5 ft each side   Perform 6 -8 repetitions for each side.    Rest break as needed.

## 2018-07-28 ENCOUNTER — Ambulatory Visit: Payer: Medicare Other

## 2018-07-28 DIAGNOSIS — R2681 Unsteadiness on feet: Secondary | ICD-10-CM | POA: Diagnosis not present

## 2018-07-28 DIAGNOSIS — R262 Difficulty in walking, not elsewhere classified: Secondary | ICD-10-CM

## 2018-07-28 DIAGNOSIS — Z9181 History of falling: Secondary | ICD-10-CM

## 2018-07-28 DIAGNOSIS — M6281 Muscle weakness (generalized): Secondary | ICD-10-CM

## 2018-07-28 NOTE — Patient Instructions (Addendum)
Sit to stand    Sitting at the edge of your bed,    Lean forward to place your body weight on top of your foot   Stand up   Then sit down.    Repeat 5 times   Perform 3 sets daily.     You can also perform this on a sturdy chair with arms, using your hands to help you stand, and minimal to no use of your hands to sit.      Side stepping               Step sideways to the right and to the left with hand hold assist onto something sturdy for safety. 5 ft each side              Perform 6 -8 repetitions for each side.               Rest break as needed.

## 2018-07-28 NOTE — Therapy (Addendum)
[facility] [address] [place], [address] Phone: [phone]   Fax:  [phone]  Physical Therapy Treatment And Discharge Summary  Patient Details  Name: Jaclyn Galvan MRN: [id number] Date of Birth: 05-30-1941 Referring Provider (PT): Tama High, MD   Encounter Date: 07/28/2018  PT End of Session - 07/28/18 1317    Visit Number  24    Number of Visits  27    Date for PT Re-Evaluation  07/29/18    Authorization Type  5    Authorization Time Period  of 10 progress report    PT Start Time  6712    PT Stop Time  1401    PT Time Calculation (min)  44 min    Equipment Utilized During Treatment  Gait belt   Pt SPC   Activity Tolerance  Patient tolerated treatment well    Behavior During Therapy  WFL for tasks assessed/performed       Past Medical History:  Diagnosis Date  . Anemia    resolved from the beginning of this year  . Atrial fibrillation [facility])    cardioversion March 2004  . Cholelithiasis   . Chronic gastritis   . CKD (chronic kidney disease)    chronic kidney disease stage III  . Diverticulosis   . 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 DJD (degenerative joint disease)   . Elbow injury   . Fibromyalgia   . HH (hiatus hernia)   . Hypercholesterolemia    controlled with meds  . Hypertension    controlled with meds, checks regularly  . Liver cyst   . Osteopenia   . PPD positive   . Renal insufficiency   . Sleep apnea     Past Surgical History:  Procedure Laterality Date  . ADENOIDECTOMY    . Adenomatous polyp of duodenum    . APPENDECTOMY    . BACK SURGERY    . BILATERAL OOPHORECTOMY Right   . BREAST BIOPSY Right    1988 negative  . BREAST BIOPSY Right 09/25/2015   stereo,benign  . BREAST EXCISIONAL BIOPSY Left    ? when,benign  . CHOLECYSTECTOMY    . DILATION AND CURETTAGE OF UTERUS  1966  . ESOPHAGOGASTRODUODENOSCOPY  (EGD) WITH PROPOFOL N/A 07/31/2015   Procedure: ESOPHAGOGASTRODUODENOSCOPY (EGD) WITH PROPOFOL;  Surgeon: [name], MD;  Location: [facility] ENDOSCOPY;  Service: Endoscopy;  Laterality: N/A;  . ESOPHAGOGASTRODUODENOSCOPY (EGD) WITH PROPOFOL N/A 05/06/2016   Procedure: ESOPHAGOGASTRODUODENOSCOPY (EGD) WITH PROPOFOL;  Surgeon: [name], MD;  Location: [facility] ENDOSCOPY;  Service: Endoscopy;  Laterality: N/A;  . ESOPHAGOGASTRODUODENOSCOPY (EGD) WITH PROPOFOL N/A 10/30/2017   Procedure: ESOPHAGOGASTRODUODENOSCOPY (EGD) WITH PROPOFOL;  Surgeon: [name], MD;  Location: [facility] ENDOSCOPY;  Service: Endoscopy;  Laterality: N/A;  . JOINT REPLACEMENT     total knee Dr. Sabra Heck with excessive postoperative bleeding  . RHIZOTOMY    . TONSILLECTOMY    . TOTAL KNEE ARTHROPLASTY Bilateral    2001,2006  . TUBAL LIGATION  1971    There were no vitals filed for this visit.  Subjective Assessment - 07/28/18 1319    Subjective  Feels like today is a good day to graduate PT. Feels like her balance has improved.     Pertinent History  Balance/leg weakness. Chronic issue. 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 Also had bilateral knee replacements and pt  was not able to get the bend back in her knees.  January 2018, pt fainted. Had tests which revealed pt had a stroke a while ago. Pt husband said that he thinks it did not affect anyrhing.  Pt has difficulty getting up from a chair without help.  Had a fall around May 2019 when pt was in the shower.  Pt was trying to get up from her shower chair and her foot slipped. Did not injure anything.  No other falls since then. Pt husband also states that pt's head gets ahead of herself, meaning what her brain tells her feet to do is not fast enough to react.   Pt states fear of falling but does not keep her from doing activities or getting out of her home.    Pt states not having back, hip, or knee pain currently. Occasional back pain  for the past 3 months.     Patient Stated Goals  Pt expresses desire to get better. Wants to go up and down the stairs better.     Currently in Pain?  No/denies         [facility] PT Assessment - 07/28/18 0001      Observation/Other Assessments   Focus on Therapeutic Outcomes (FOTO)   50   measured on 07/26/2018     Strength   Overall Strength Comments  measured on 07/26/2018    Right Hip Flexion  5/5    Right Hip Extension  5/5   seated manually resisted hip extension isometrics   Right Hip ABduction  4+/5   seated clamshell isometrics   Left Hip Flexion  4+/5    Left Hip Extension  5/5   seated manually resisted hip extension isometrics   Left Hip ABduction  4+/5   seated clamshell isometrics   Right Knee Flexion  4+/5    Right Knee Extension  5/5    Left Knee Flexion  4+/5    Left Knee Extension  5/5                           PT Education - 07/28/18 1338    Education Details  ther-ex, progress, HEP    Person(s) Educated  Patient    Methods  Explanation;Demonstration;Tactile cues;Verbal cues;Handout    Comprehension  Returned demonstration;Verbalized understanding        Objectives  Therapeutic exercise 89degrees R knee flexion AROM at start of session  Seated R knee flexionPROM with PT10x3 Then AAROM 10x3 90degrees R knee flexion afterwards  Sit <>stand from20inch mat table x 10 then 19 inchmat table 5xwith B UE to stand, andno UE assist to sit  Then on regular chair with arms 5x with B UE assist to stand, no UE assist to sit.   Forward step over 7 inch cones with one UE assist5xeach LE Emphasis on foot clearance   Ascending and descending 4 regular steps 3x with B UE assist and PT CGA. Improved ability to descend stairs with R knee flexion. Cues for femoral control R > L  Reviewed progress/current status with PT towards goals  Side stepping 32 ft to the R and 32 ft to the L  without UE assist to occasional SPC assist.   Improved exercise technique, movement at target joints, use of target muscles after min to mod verbal, visual, tactile cues.   Pt demonstrates improved B LE strength, knee flexion, ability to stand up from lower surfChraces, ability to ascend and  descend stairs with B UE assist in a reciprocal pattern, and improved balance since initial evaluation. Pt has made progress with PT towards goals. Pt also able to consistently walk and turn 180 degrees in 3 seconds or less using her SPC without LOB suggesting decreased fall risk. Skilled physical therapy services discharged with patient continuing progress with her HEP.         PT Short Term Goals - 07/08/18 1542      PT SHORT TERM GOAL #1   Title  Patient will be independent with her HEP to promote ability to stand up from a chair, improve LE strength, and improve balance.     Baseline  Pt states not performing her HEP (consistently)    Time  3    Period  Weeks    Status  On-going    Target Date  07/15/18        PT Long Term Goals - 07/28/18 1348      PT LONG TERM GOAL #1   Title  Patient will improve bilateral knee flexion ROM by at least 12 degrees to promote ability to stand up from her shower chair with less difficulty to decrease fall risk     Baseline  Knee flexion AROM: 73 degrees R, 80 degrees L (04/28/2018); 90 degrees R, 91 L (05/31/2018); 90 degrees bilateral knee flexion AROM (06/23/2018); 90 degrees R knee flexion, 96 degrees L knee flexion AROM (07/28/2018)     Time  5    Period  Weeks    Status  Achieved    Target Date  07/29/18      PT LONG TERM GOAL #2   Title  Patient will improve bilateral LE strength by at least 1/2 MMT grade to promote ability to perform standing tasks as well as decrease fall risk.      Time  5    Period  Weeks    Status  Achieved   potentially met   Target Date  07/29/18      PT LONG TERM GOAL #3   Title  Patient will improve her Berg Balance  score by at least 6 points as a demonstration of improved balance.     Baseline  35/56 (<46 suggests increased fall risk; 04/28/2018); 45/56 (05/31/2018)    Time  8    Period  Weeks    Status  Achieved    Target Date  06/24/18      PT LONG TERM GOAL #4   Title  Patient will improve her FOTO score by at least 8 points as a demonstration of improved function.     Baseline  LE FOTO 49 (04/28/2018); 53 (05/31/2018); 52 (06/23/2018); 50 (07/28/2018)    Time  5    Period  Weeks    Status  On-going    Target Date  07/29/18      PT LONG TERM GOAL #5   Title  Patient will improve her Dynamic Gait index score with SPC by at least 5 points as a demonstration of improved balance.     Baseline  DGI score: 10 (06/23/2018); 14 (07/21/2018)    Time  5    Period  Weeks    Status  Partially Met    Target Date  07/29/18            Plan - 07/28/18 1405    Clinical Impression Statement  Pt demonstrates improved B LE strength, knee flexion, ability to stand up from lower surfaces, ability to ascend  and descend stairs with B UE assist in a reciprocal pattern, and improved balance since initial evaluation. Pt has made progress with PT towards goals. Pt also able to consistently walk and turn 180 degrees in 3 seconds or less using her SPC without LOB suggesting decreased fall risk. Skilled physical therapy services discharged with patient continuing progress with her HEP.     History and Personal Factors relevant to plan of care:  Chronicity of condition    Clinical Presentation  Stable    Clinical Presentation due to:  Pt has made progress with PT towards goals.     Clinical Decision Making  Low    Rehab Potential  --    Clinical Impairments Affecting Rehab Potential  --    PT Frequency  --    PT Duration  --   5 weeks   PT Treatment/Interventions  Balance training;Neuromuscular re-education;Patient/family education;Manual techniques;Therapeutic activities;Therapeutic exercise;Gait training    PT Next  Visit Plan  Continue progress with her HEP    Consulted and Agree with Plan of Care  Patient       Patient will benefit from skilled therapeutic intervention in order to improve the following deficits and impairments:  Postural dysfunction, Improper body mechanics, Difficulty walking, Decreased strength, Decreased range of motion, Decreased balance  Visit Diagnosis: Unsteadiness on feet  Muscle weakness (generalized)  Difficulty in walking, not elsewhere classified  History of falling     Problem List Patient Active Problem List   Diagnosis Date Noted  . Absolute anemia 06/26/2015  . A-fib ([place]) 06/26/2015  . Hepatic cyst 06/26/2015  . BP (high blood pressure) 06/26/2015  . HLD (hyperlipidemia) 06/26/2015  . H/O: osteoarthritis 06/26/2015  . Arthritis, degenerative 06/26/2015  . Chronic kidney disease (CKD), stage III (moderate) ([place]) 06/26/2015  . Osteopenia 06/26/2015  . Obstructive apnea 04/19/2015   Thank you for your referral.  Joneen Boers PT, DPT   07/28/2018, 2:19 PM  [place] [facility] [address], [place], [address] Phone: [phone]   Fax:  [phone]  Name: Jaclyn Galvan MRN: [id number] Date of Birth: 07-06-1941

## 2018-08-02 ENCOUNTER — Ambulatory Visit: Payer: Medicare Other

## 2018-08-05 ENCOUNTER — Ambulatory Visit: Payer: Medicare Other

## 2018-10-29 ENCOUNTER — Encounter
Admission: RE | Disposition: A | Discharge status: Home / Self Care | Payer: Self-pay | Source: Home / Self Care | Attending: Gastroenterology

## 2018-10-29 ENCOUNTER — Encounter: Payer: Self-pay | Admitting: *Deleted

## 2018-10-29 ENCOUNTER — Ambulatory Visit: Payer: Medicare Other | Admitting: Anesthesiology

## 2018-10-29 ENCOUNTER — Ambulatory Visit
Admission: RE | Admit: 2018-10-29 | Discharge: 2018-10-29 | Disposition: A | Discharge status: Home / Self Care | Payer: Medicare Other | Attending: Gastroenterology | Admitting: Gastroenterology

## 2018-10-29 DIAGNOSIS — Z96653 Presence of artificial knee joint, bilateral: Secondary | ICD-10-CM | POA: Diagnosis not present

## 2018-10-29 DIAGNOSIS — Z79899 Other long term (current) drug therapy: Secondary | ICD-10-CM | POA: Insufficient documentation

## 2018-10-29 DIAGNOSIS — D132 Benign neoplasm of duodenum: Secondary | ICD-10-CM | POA: Insufficient documentation

## 2018-10-29 DIAGNOSIS — K2950 Unspecified chronic gastritis without bleeding: Secondary | ICD-10-CM | POA: Diagnosis not present

## 2018-10-29 DIAGNOSIS — Z8673 Personal history of transient ischemic attack (TIA), and cerebral infarction without residual deficits: Secondary | ICD-10-CM | POA: Diagnosis not present

## 2018-10-29 DIAGNOSIS — D120 Benign neoplasm of cecum: Secondary | ICD-10-CM | POA: Diagnosis not present

## 2018-10-29 DIAGNOSIS — Z86010 Personal history of colonic polyps: Secondary | ICD-10-CM | POA: Insufficient documentation

## 2018-10-29 DIAGNOSIS — K449 Diaphragmatic hernia without obstruction or gangrene: Secondary | ICD-10-CM | POA: Insufficient documentation

## 2018-10-29 DIAGNOSIS — Z9989 Dependence on other enabling machines and devices: Secondary | ICD-10-CM | POA: Diagnosis not present

## 2018-10-29 DIAGNOSIS — E7800 Pure hypercholesterolemia, unspecified: Secondary | ICD-10-CM | POA: Insufficient documentation

## 2018-10-29 DIAGNOSIS — K2980 Duodenitis without bleeding: Secondary | ICD-10-CM | POA: Diagnosis not present

## 2018-10-29 DIAGNOSIS — N183 Chronic kidney disease, stage 3 (moderate): Secondary | ICD-10-CM | POA: Insufficient documentation

## 2018-10-29 DIAGNOSIS — K210 Gastro-esophageal reflux disease with esophagitis: Secondary | ICD-10-CM | POA: Diagnosis not present

## 2018-10-29 DIAGNOSIS — R194 Change in bowel habit: Secondary | ICD-10-CM | POA: Diagnosis not present

## 2018-10-29 DIAGNOSIS — Z7982 Long term (current) use of aspirin: Secondary | ICD-10-CM | POA: Insufficient documentation

## 2018-10-29 DIAGNOSIS — I129 Hypertensive chronic kidney disease with stage 1 through stage 4 chronic kidney disease, or unspecified chronic kidney disease: Secondary | ICD-10-CM | POA: Diagnosis not present

## 2018-10-29 DIAGNOSIS — K5730 Diverticulosis of large intestine without perforation or abscess without bleeding: Secondary | ICD-10-CM | POA: Insufficient documentation

## 2018-10-29 DIAGNOSIS — Z8719 Personal history of other diseases of the digestive system: Secondary | ICD-10-CM | POA: Insufficient documentation

## 2018-10-29 DIAGNOSIS — G4730 Sleep apnea, unspecified: Secondary | ICD-10-CM | POA: Insufficient documentation

## 2018-10-29 DIAGNOSIS — Z13810 Encounter for screening for upper gastrointestinal disorder: Secondary | ICD-10-CM | POA: Diagnosis present

## 2018-10-29 DIAGNOSIS — D122 Benign neoplasm of ascending colon: Secondary | ICD-10-CM | POA: Insufficient documentation

## 2018-10-29 HISTORY — DX: Gastro-esophageal reflux disease with esophagitis, without bleeding: K21.00

## 2018-10-29 HISTORY — DX: Benign neoplasm of colon, unspecified: D12.6

## 2018-10-29 HISTORY — DX: Cerebral infarction, unspecified: I63.9

## 2018-10-29 HISTORY — DX: Disease of esophagus, unspecified: K22.9

## 2018-10-29 HISTORY — DX: Gastro-esophageal reflux disease with esophagitis: K21.0

## 2018-10-29 HISTORY — PX: ESOPHAGOGASTRODUODENOSCOPY: SHX5428

## 2018-10-29 HISTORY — PX: COLONOSCOPY WITH PROPOFOL: SHX5780

## 2018-10-29 SURGERY — EGD (ESOPHAGOGASTRODUODENOSCOPY)
Anesthesia: General

## 2018-10-29 MED ORDER — LIDOCAINE HCL (PF) 2 % IJ SOLN
INTRAMUSCULAR | Status: DC | PRN
  Administered 2018-10-29: 100 mg via INTRADERMAL

## 2018-10-29 MED ORDER — PROPOFOL 500 MG/50ML IV EMUL
INTRAVENOUS | Status: DC | PRN
  Administered 2018-10-29: 130 ug/kg/min via INTRAVENOUS

## 2018-10-29 MED ORDER — SODIUM CHLORIDE 0.9 % IV SOLN
INTRAVENOUS | Status: DC
  Administered 2018-10-29: 09:00:00 via INTRAVENOUS

## 2018-10-29 MED ORDER — PROPOFOL 10 MG/ML IV BOLUS
INTRAVENOUS | Status: DC | PRN
  Administered 2018-10-29: 70 mg via INTRAVENOUS
  Administered 2018-10-29: 10 mg via INTRAVENOUS

## 2018-10-29 NOTE — Anesthesia Procedure Notes (Signed)
Date/Time: 10/29/2018 8:42 AM Performed by: Nelda Marseille, CRNA Pre-anesthesia Checklist: Patient identified, Emergency Drugs available, Suction available, Patient being monitored and Timeout performed Oxygen Delivery Method: Nasal cannula

## 2018-10-29 NOTE — Transfer of Care (Signed)
Immediate Anesthesia Transfer of Care Note  Patient: Jaclyn Galvan  Procedure(s) Performed: ESOPHAGOGASTRODUODENOSCOPY (EGD) (N/A ) COLONOSCOPY WITH PROPOFOL (N/A )  Patient Location: PACU  Anesthesia Type:General  Level of Consciousness: sedated  Airway & Oxygen Therapy: Patient Spontanous Breathing and Patient connected to face mask oxygen  Post-op Assessment: Report given to RN and Post -op Vital signs reviewed and stable  Post vital signs: Reviewed and stable  Last Vitals:  Vitals Value Taken Time  BP 119/49 10/29/2018  9:46 AM  Temp 36.1 C 10/29/2018  9:40 AM  Pulse 70 10/29/2018  9:48 AM  Resp 17 10/29/2018  9:48 AM  SpO2 96 % 10/29/2018  9:48 AM  Vitals shown include unvalidated device data.  Last Pain:  Vitals:   10/29/18 0940  TempSrc: Tympanic         Complications: No apparent anesthesia complications

## 2018-10-29 NOTE — Anesthesia Postprocedure Evaluation (Signed)
Anesthesia Post Note  Patient: Jaclyn Galvan  Procedure(s) Performed: ESOPHAGOGASTRODUODENOSCOPY (EGD) (N/A ) COLONOSCOPY WITH PROPOFOL (N/A )  Patient location during evaluation: Endoscopy Anesthesia Type: General Level of consciousness: awake and alert and oriented Pain management: pain level controlled Vital Signs Assessment: post-procedure vital signs reviewed and stable Respiratory status: spontaneous breathing, nonlabored ventilation and respiratory function stable Cardiovascular status: blood pressure returned to baseline and stable Postop Assessment: no signs of nausea or vomiting Anesthetic complications: no     Last Vitals:  Vitals:   10/29/18 0809 10/29/18 0940  BP: (!) 148/75 (!) 119/49  Pulse: 84 74  Resp: 18 18  Temp: (!) 36.2 C (!) 36.1 C  SpO2: 97% 97%    Last Pain:  Vitals:   10/29/18 0940  TempSrc: Tympanic                 Donnita Farina

## 2018-10-29 NOTE — H&P (Signed)
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  There is previous evidence of Barrett's esophagus not in evidence on 2 serial biopsies.  Does have a history of adenomatous colon polyps.  Further there is been some change in bowel habits with symptoms of IBS and looser stools.  She tolerated her prep well.  Does take 81 mg aspirin the last time yesterday.  She takes no other aspirin product blood thinner.  sHe takes a [employer] brand of aspirin free migraine over-the-counter medication.    Current Facility-Administered Medications:  .  0.9 %  sodium chloride infusion, , Intravenous, Continuous, Jaclyn Sails, MD  Medications Prior to Admission  Medication Sig Dispense Refill Last Dose  . amLODipine (NORVASC) 5 MG tablet Take 5 mg by mouth daily.   10/28/2018 at Unknown time  . ascorbic acid (VITAMIN C) 1000 MG tablet Take 1,000 mg by mouth daily.   10/24/2018  . atorvastatin (LIPITOR) 10 MG tablet Take 5 mg by mouth daily.    Past Week at Unknown time  . atorvastatin (LIPITOR) 10 MG tablet Take 10 mg by mouth daily. 0.5 tablets by mouth at night.     . losartan (COZAAR) 100 MG tablet Take 100 mg by mouth daily.    10/28/2018 at Unknown time  . pantoprazole (PROTONIX) 40 MG tablet Take 40 mg by mouth 2 (two) times daily.   10/28/2018 at Unknown time  . sertraline (ZOLOFT) 25 MG tablet Take 25 mg by mouth daily. 2 tablets everay am.     . AMLODIPINE-ATORVASTATIN PO Take by mouth.   Taking  . aspirin EC 81 MG tablet Take 81 mg by mouth daily.   10/24/2018  . aspirin-acetaminophen-caffeine (EXCEDRIN MIGRAINE) 250-250-65 MG per tablet Take by mouth every 6 (six) hours as needed for headache.   Taking  . Black Cohosh-SoyIsoflav-Magnol (ESTROVEN  MENOPAUSE RELIEF) CAPS Take by mouth at bedtime.    Taking  . calcium carbonate 1250 MG capsule Take 1,250 mg by mouth 2 (two) times daily with a meal.   Taking  . cholecalciferol (VITAMIN D) 400 UNITS TABS tablet Take 400 Units by mouth.   Taking  . co-enzyme Q-10 30 MG capsule Take 30 mg by mouth daily.    Taking  . donepezil (ARICEPT) 5 MG tablet Take 5 mg by mouth at bedtime.   Taking  . DULoxetine (CYMBALTA) 20 MG capsule Take 30 mg by mouth daily.    Taking  . Multiple Vitamin (MULTIVITAMIN) tablet Take 1 tablet by mouth daily.   Taking  . Nutritional Supplements (ESTROVEN PMS PO) Take by mouth.   Taking  . UNABLE TO FIND    Taking  . vitamin B-12 (CYANOCOBALAMIN) 100 MCG tablet Take 100 mcg by mouth daily.   Taking     Allergies  Allergen Reactions  . Ciprofloxacin   . Morphine And Related      Past Medical History:  Diagnosis Date  . Adenomatous colon polyp 07/31/2015  . Anemia    resolved from the beginning of this year  . Atrial fibrillation [facility])    cardioversion March 2004  . Cholelithiasis   . Chronic gastritis   . CKD (chronic kidney disease)    chronic kidney disease stage III  . Diverticulosis   .  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 DJD (degenerative joint disease)   . DJD (degenerative joint disease)   . Elbow injury   . Fibromyalgia   . HH (hiatus hernia)   . Hypercholesterolemia    controlled with meds  . Hypertension    controlled with meds, checks regularly  . Irregular Z line of esophagus 11/21/2014  . Liver cyst   . Osteopenia   . PPD positive   . Reflux esophagitis   . Renal insufficiency   . Sleep apnea   . Stroke [facility])    treated by Dr. Brigitte Pulse    Review of systems:      Physical Exam    Heart and lungs: Regular rate and rhythm without rub or gallop, lungs are bilaterally clear.    HEENT: Normocephalic atraumatic eyes are anicteric    Other:    Pertinant exam for procedure: Soft  nontender nondistended bowel sounds positive normoactive.    Planned proceedures: EGD, colonoscopy and indicated procedures. I have discussed the risks benefits and complications of procedures to include not limited to bleeding, infection, perforation and the risk of sedation and the patient wishes to proceed.    Jaclyn Sails, MD Gastroenterology 10/29/2018  8:29 AM

## 2018-10-29 NOTE — Op Note (Addendum)
[facility] Gastroenterology Patient Name: Jaclyn Galvan Procedure Date: 10/29/2018 8:23 AM MRN: [id number] Account #: [id number] Date of Birth: [birth year] Admit Type: Outpatient Age: 78 Room: [facility] ENDO ROOM 1 Gender: Female Note Status: Finalized Procedure:            Colonoscopy Indications:          Personal history of colonic polyps, Change in bowel                        habits Providers:            [name], MD Medicines:            Monitored Anesthesia Care Complications:        No immediate complications. Procedure:            Pre-Anesthesia Assessment:                       - ASA Grade Assessment: III - A patient with severe                        systemic disease.                       After obtaining informed consent, the colonoscope was                        passed under direct vision. Throughout the procedure,                        the patient's blood pressure, pulse, and oxygen                        saturations were monitored continuously. The                        Colonoscope was introduced through the anus and                        advanced to the the cecum, identified by appendiceal                        orifice and ileocecal valve. The colonoscopy was                        performed without difficulty. The patient tolerated the                        procedure well. The quality of the bowel preparation                        was good. Findings:      Multiple small-mouthed diverticula were found in the sigmoid colon and       distal descending colon.      A 4 mm polyp was found in the proximal ascending colon. The polyp was       sessile. The polyp was removed with a cold snare. Resection and       retrieval were complete.      A 3 mm polyp was found in the cecum. The polyp was sessile. The polyp       was removed with a  cold biopsy forceps. Resection and retrieval were       complete.      Biopsies for histology were taken  with a cold forceps from the right       colon and left colon for evaluation of microscopic colitis.      The retroflexed view of the distal rectum and anal verge was normal and       showed no anal or rectal abnormalities.      The digital rectal exam was normal. Impression:           - Diverticulosis in the sigmoid colon and in the distal                        descending colon.                       - One 4 mm polyp in the proximal ascending colon,                        removed with a cold snare. Resected and retrieved.                       - One 3 mm polyp in the cecum, removed with a cold                        biopsy forceps. Resected and retrieved.                       - The distal rectum and anal verge are normal on                        retroflexion view.                       - Biopsies were taken with a cold forceps from the                        right colon and left colon for evaluation of                        microscopic colitis. Recommendation:       - Discharge patient to home.                       - Telephone GI clinic for pathology results in 5 days. Procedure Code(s):    --- Professional ---                       [phone], Colonoscopy, flexible; with removal of tumor(s),                        polyp(s), or other lesion(s) by snare technique                       [id number], 39, Colonoscopy, flexible; with biopsy, single                        or multiple Diagnosis Code(s):    --- Professional ---                       D12.2,  Benign neoplasm of ascending colon                       D12.0, Benign neoplasm of cecum                       Z86.010, Personal history of colonic polyps                       R19.4, Change in bowel habit                       K57.30, Diverticulosis of large intestine without                        perforation or abscess without bleeding CPT copyright 2018 American Medical Association. All rights reserved. The codes documented in this report are  preliminary and upon coder review may  be revised to meet current compliance requirements. [name], MD 10/29/2018 9:47:16 AM This report has been signed electronically. Number of Addenda: 0 Note Initiated On: 10/29/2018 8:23 AM Scope Withdrawal Time: 0 hours 14 minutes 2 seconds  Total Procedure Duration: 0 hours 22 minutes 58 seconds       [facility]

## 2018-10-29 NOTE — Anesthesia Preprocedure Evaluation (Signed)
Anesthesia Evaluation  Patient identified by MRN, date of birth, ID band Patient awake    Reviewed: Allergy & Precautions, NPO status , Patient's Chart, lab work & pertinent test results  History of Anesthesia Complications Negative for: history of anesthetic complications  Airway Mallampati: II  TM Distance: >3 FB Neck ROM: Full    Dental no notable dental hx.    Pulmonary sleep apnea and Continuous Positive Airway Pressure Ventilation , neg COPD,    breath sounds clear to auscultation- rhonchi (-) wheezing      Cardiovascular hypertension, Pt. on medications (-) CAD, (-) Past MI, (-) Cardiac Stents and (-) CABG  Rhythm:Regular Rate:Normal - Systolic murmurs and - Diastolic murmurs    Neuro/Psych neg Seizures CVA (balance problems) negative psych ROS   GI/Hepatic Neg liver ROS, hiatal hernia,   Endo/Other  negative endocrine ROSneg diabetes  Renal/GU Renal InsufficiencyRenal disease     Musculoskeletal  (+) Arthritis , Fibromyalgia -  Abdominal (+) + obese,   Peds  Hematology  (+) anemia ,   Anesthesia Other Findings Past Medical History: 07/31/2015: Adenomatous colon polyp No date: Anemia     Comment:  resolved from the beginning of this year No date: Atrial fibrillation ([place])     Comment:  cardioversion March 2004 No date: Cholelithiasis No date: Chronic gastritis No date: CKD (chronic kidney disease)     Comment:  chronic kidney disease stage III No date: Diverticulosis No date: DJD (degenerative joint disease)     Comment:  B knees, B knee replacements, 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 Brigitte Pulse   Reproductive/Obstetrics                             Anesthesia Physical Anesthesia Plan  ASA: III  Anesthesia Plan: General   Post-op Pain Management:    Induction: Intravenous  PONV Risk Score and Plan: 2 and Propofol infusion  Airway Management Planned: Natural Airway  Additional Equipment:   Intra-op Plan:   Post-operative Plan:   Informed Consent: I have reviewed the patients History and Physical, chart, labs and discussed the procedure including the risks, benefits and alternatives for the proposed anesthesia with the patient or authorized representative who has indicated his/her understanding and acceptance.     Dental advisory given  Plan Discussed with: CRNA and Anesthesiologist  Anesthesia Plan Comments:         Anesthesia Quick Evaluation

## 2018-10-29 NOTE — Op Note (Signed)
[facility] Gastroenterology Patient Name: Jaclyn Galvan Procedure Date: 10/29/2018 8:24 AM MRN: [id number] Account #: [id number] Date of Birth: 02/06/1941 Admit Type: Outpatient Age: 78 Room: [facility] ENDO ROOM 1 Gender: Female Note Status: Finalized Procedure:            Upper GI endoscopy Indications:          Surveillance procedure, history of duodenal adenoma Providers:            [name], MD Medicines:            Monitored Anesthesia Care Complications:        No immediate complications. Procedure:            Pre-Anesthesia Assessment:                       - ASA Grade Assessment: III - A patient with severe                        systemic disease.                       After obtaining informed consent, the endoscope was                        passed under direct vision. Throughout the procedure,                        the patient's blood pressure, pulse, and oxygen                        saturations were monitored continuously. The Endoscope                        was introduced through the mouth, and advanced to the                        fourth part of duodenum. The upper GI endoscopy was                        accomplished without difficulty. The patient tolerated                        the procedure well. Findings:      The Z-line was variable. Biopsies were taken with a cold forceps in a       quadrant/targeted manner for histology.      The exam of the esophagus was otherwise normal.      Patchy mild inflammation characterized by congestion (edema) and       erythema was found in the prepyloric region of the stomach. Biopsies       were taken with a cold forceps for histology.      Biopsies were taken with a cold forceps in the gastric body and in the       gastric antrum for histology.      A small hiatal hernia was found. The Z-line was a variable distance from       incisors; the hiatal hernia was sliding.      The examined duodenum was  normal. Biopsies were taken with a cold       forceps for histology. Biopsy of the apparent previous polypectomy site  also taken with a cold forcep and placed into a separate container. NBI       of the site appeared normal.      The cardia and gastric fundus were normal on retroflexion otherwise. Impression:           - Z-line variable. Biopsied.                       - Gastritis. Biopsied.                       - Small hiatal hernia.                       - Normal examined duodenum. Biopsied.                       - Biopsies were taken with a cold forceps for histology                        in the gastric body and in the gastric antrum. Recommendation:       - Await pathology results.                       - Continue present medications. Procedure Code(s):    --- Professional ---                       [phone], Esophagogastroduodenoscopy, flexible, transoral;                        with biopsy, single or multiple Diagnosis Code(s):    --- Professional ---                       K22.8, Other specified diseases of esophagus                       K29.70, Gastritis, unspecified, without bleeding                       K44.9, Diaphragmatic hernia without obstruction or                        gangrene CPT copyright 2018 American Medical Association. All rights reserved. The codes documented in this report are preliminary and upon coder review may  be revised to meet current compliance requirements. [name], MD 10/29/2018 9:12:02 AM This report has been signed electronically. Number of Addenda: 0 Note Initiated On: 10/29/2018 8:24 AM      [facility]

## 2018-10-29 NOTE — Anesthesia Post-op Follow-up Note (Signed)
Anesthesia QCDR form completed.        

## 2018-11-01 ENCOUNTER — Encounter: Payer: Self-pay | Admitting: Gastroenterology

## 2018-11-01 LAB — SURGICAL PATHOLOGY

## 2019-05-02 ENCOUNTER — Other Ambulatory Visit: Payer: Self-pay | Admitting: Obstetrics and Gynecology

## 2019-05-02 DIAGNOSIS — Z1231 Encounter for screening mammogram for malignant neoplasm of breast: Secondary | ICD-10-CM

## 2019-06-06 ENCOUNTER — Ambulatory Visit
Admission: RE | Admit: 2019-06-06 | Discharge: 2019-06-06 | Disposition: A | Discharge status: Home / Self Care | Payer: Medicare Other | Source: Ambulatory Visit | Attending: Obstetrics and Gynecology | Admitting: Obstetrics and Gynecology

## 2019-06-06 DIAGNOSIS — Z1231 Encounter for screening mammogram for malignant neoplasm of breast: Secondary | ICD-10-CM | POA: Insufficient documentation

## 2019-10-02 ENCOUNTER — Other Ambulatory Visit: Payer: Self-pay

## 2019-10-02 DIAGNOSIS — Z96653 Presence of artificial knee joint, bilateral: Secondary | ICD-10-CM | POA: Insufficient documentation

## 2019-10-02 DIAGNOSIS — N183 Chronic kidney disease, stage 3 unspecified: Secondary | ICD-10-CM | POA: Diagnosis not present

## 2019-10-02 DIAGNOSIS — I129 Hypertensive chronic kidney disease with stage 1 through stage 4 chronic kidney disease, or unspecified chronic kidney disease: Secondary | ICD-10-CM | POA: Insufficient documentation

## 2019-10-02 DIAGNOSIS — R1030 Lower abdominal pain, unspecified: Secondary | ICD-10-CM | POA: Diagnosis present

## 2019-10-02 DIAGNOSIS — Z8673 Personal history of transient ischemic attack (TIA), and cerebral infarction without residual deficits: Secondary | ICD-10-CM | POA: Insufficient documentation

## 2019-10-02 DIAGNOSIS — Z7982 Long term (current) use of aspirin: Secondary | ICD-10-CM | POA: Diagnosis not present

## 2019-10-02 DIAGNOSIS — Z79899 Other long term (current) drug therapy: Secondary | ICD-10-CM | POA: Diagnosis not present

## 2019-10-02 LAB — COMPREHENSIVE METABOLIC PANEL
ALT: 16 U/L (ref 0–44)
AST: 23 U/L (ref 15–41)
Albumin: 4.5 g/dL (ref 3.5–5.0)
Alkaline Phosphatase: 81 U/L (ref 38–126)
Anion gap: 13 (ref 5–15)
BUN: 16 mg/dL (ref 8–23)
CO2: 23 mmol/L (ref 22–32)
Calcium: 9.8 mg/dL (ref 8.9–10.3)
Chloride: 105 mmol/L (ref 98–111)
Creatinine, Ser: 1.16 mg/dL — ABNORMAL HIGH (ref 0.44–1.00)
GFR calc Af Amer: 52 mL/min — ABNORMAL LOW (ref >60)
GFR calc non Af Amer: 45 mL/min — ABNORMAL LOW (ref >60)
Glucose, Bld: 125 mg/dL — ABNORMAL HIGH (ref 70–99)
Potassium: 3.8 mmol/L (ref 3.5–5.1)
Sodium: 141 mmol/L (ref 135–145)
Total Bilirubin: 1.8 mg/dL — ABNORMAL HIGH (ref 0.3–1.2)
Total Protein: 8 g/dL (ref 6.5–8.1)

## 2019-10-02 LAB — CBC
HCT: 43.9 % (ref 36.0–46.0)
Hemoglobin: 14.3 g/dL (ref 12.0–15.0)
MCH: 31.2 pg (ref 26.0–34.0)
MCHC: 32.6 g/dL (ref 30.0–36.0)
MCV: 95.6 fL (ref 80.0–100.0)
Platelets: 252 10*3/uL (ref 150–400)
RBC: 4.59 MIL/uL (ref 3.87–5.11)
RDW: 13.3 % (ref 11.5–15.5)
WBC: 9.8 10*3/uL (ref 4.0–10.5)
nRBC: 0 % (ref 0.0–0.2)

## 2019-10-02 NOTE — ED Triage Notes (Signed)
Pt states two hours of right lower quadrant pain with three episodes of vomiting and diarrhea. Pt appears in no acute distress, denies fever. 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

## 2019-10-03 ENCOUNTER — Emergency Department
Admission: EM | Admit: 2019-10-03 | Discharge: 2019-10-03 | Disposition: A | Discharge status: Home / Self Care | Payer: Medicare Other | Attending: Emergency Medicine | Admitting: Emergency Medicine

## 2019-10-03 ENCOUNTER — Encounter: Payer: Self-pay | Admitting: Radiology

## 2019-10-03 ENCOUNTER — Emergency Department: Payer: Medicare Other

## 2019-10-03 DIAGNOSIS — R1030 Lower abdominal pain, unspecified: Secondary | ICD-10-CM | POA: Diagnosis not present

## 2019-10-03 MED ORDER — IOHEXOL 300 MG/ML  SOLN
75.0000 mL | Freq: Once | INTRAMUSCULAR | Status: AC | PRN
  Administered 2019-10-03: 75 mL via INTRAVENOUS
  Filled 2019-10-03: qty 75

## 2019-10-03 MED ORDER — SODIUM CHLORIDE 0.9 % IV SOLN
1000.0000 mL | Freq: Once | INTRAVENOUS | Status: AC
  Administered 2019-10-03: 1000 mL via INTRAVENOUS

## 2019-10-03 NOTE — ED Notes (Signed)
Pt sitting in lobby, watching tv in no acute distress.

## 2019-10-03 NOTE — ED Notes (Signed)
Pt sitting in lobby in no acute distress, watching tv.

## 2019-10-03 NOTE — ED Notes (Signed)
Pt back from CT, resting in bed with warm blanket, call light in reach, bed locked and low. NAD.

## 2019-10-03 NOTE — ED Notes (Signed)
Pt updated on wait and status

## 2019-10-03 NOTE — ED Provider Notes (Signed)
[facility] Emergency Department Provider Note   ____________________________________________    I have reviewed the triage vital signs and the nursing notes.   HISTORY  Chief Complaint Abdominal Pain     HPI Jaclyn Galvan is a 78 y.o. female who presents with complaints of abdominal pain.  Patient reports lower abdominal pain with occasional radiation to her flanks bilaterally.  Now significantly improved after being in the waiting room.  She does not take anything for this.  No nausea or vomiting.  Some dysuria and frequency.  No fevers or chills or cough.  No history of the same.  Has had abdominal surgery in the past  Past Medical History:  Diagnosis Date  . Adenomatous colon polyp 07/31/2015  . Anemia    resolved from the beginning of this year  . Atrial fibrillation [facility])    cardioversion March 2004  . Cholelithiasis   . Chronic gastritis   . CKD (chronic kidney disease)    chronic kidney disease stage III  . Diverticulosis   . 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 DJD (degenerative joint disease)   . DJD (degenerative joint disease)   . Elbow injury   . Fibromyalgia   . HH (hiatus hernia)   . Hypercholesterolemia    controlled with meds  . Hypertension    controlled with meds, checks regularly  . Irregular Z line of esophagus 11/21/2014  . Liver cyst   . Osteopenia   . PPD positive   . Reflux esophagitis   . Renal insufficiency   . Sleep apnea   . Stroke [facility])    treated by Dr. Brigitte Pulse    Patient Active Problem List   Diagnosis Date Noted  . Absolute anemia 06/26/2015  . A-fib ([place]) 06/26/2015  . Hepatic cyst 06/26/2015  . BP (high blood pressure) 06/26/2015  . HLD (hyperlipidemia) 06/26/2015  . H/O: osteoarthritis 06/26/2015  . Arthritis, degenerative 06/26/2015  . Chronic kidney disease (CKD), stage III (moderate) 06/26/2015  . Osteopenia 06/26/2015  . Obstructive apnea  04/19/2015    Past Surgical History:  Procedure Laterality Date  . ADENOIDECTOMY    . Adenomatous polyp of duodenum    . APPENDECTOMY    . BACK SURGERY    . BILATERAL OOPHORECTOMY Right   . BREAST BIOPSY Right    1988 negative  . BREAST BIOPSY Right 09/25/2015   stereo,benign  . BREAST EXCISIONAL BIOPSY Left    ? when,benign  . CHOLECYSTECTOMY    . COLONOSCOPY WITH PROPOFOL N/A 10/29/2018   Procedure: COLONOSCOPY WITH PROPOFOL;  Surgeon: [name], MD;  Location: [facility] ENDOSCOPY;  Service: Endoscopy;  Laterality: N/A;  . DILATION AND CURETTAGE OF UTERUS  1966  . ESOPHAGOGASTRODUODENOSCOPY N/A 10/29/2018   Procedure: ESOPHAGOGASTRODUODENOSCOPY (EGD);  Surgeon: [name], MD;  Location: [facility] ENDOSCOPY;  Service: Endoscopy;  Laterality: N/A;  . ESOPHAGOGASTRODUODENOSCOPY (EGD) WITH PROPOFOL N/A 07/31/2015   Procedure: ESOPHAGOGASTRODUODENOSCOPY (EGD) WITH PROPOFOL;  Surgeon: [name], MD;  Location: [facility] ENDOSCOPY;  Service: Endoscopy;  Laterality: N/A;  . ESOPHAGOGASTRODUODENOSCOPY (EGD) WITH PROPOFOL N/A 05/06/2016   Procedure: ESOPHAGOGASTRODUODENOSCOPY (EGD) WITH PROPOFOL;  Surgeon: [name], MD;  Location: [facility] ENDOSCOPY;  Service: Endoscopy;  Laterality: N/A;  . ESOPHAGOGASTRODUODENOSCOPY (EGD) WITH PROPOFOL N/A 10/30/2017   Procedure: ESOPHAGOGASTRODUODENOSCOPY (EGD) WITH PROPOFOL;  Surgeon: [name], MD;  Location: [facility] ENDOSCOPY;  Service: Endoscopy;  Laterality: N/A;  . JOINT REPLACEMENT Right  total knee Dr. Sabra Heck with excessive postoperative bleeding  . RHIZOTOMY    . TONSILLECTOMY    . TOTAL KNEE ARTHROPLASTY Bilateral    2001,2006  . TUBAL LIGATION  1971    Prior to Admission medications   Medication Sig Start Date End Date Taking? Authorizing Provider  amLODipine (NORVASC) 5 MG tablet Take 5 mg by mouth daily.    [provider]  AMLODIPINE-ATORVASTATIN PO Take by mouth.    [provider]  ascorbic acid  (VITAMIN C) 1000 MG tablet Take 1,000 mg by mouth daily.    [provider]  aspirin EC 81 MG tablet Take 81 mg by mouth daily.    [provider]  aspirin-acetaminophen-caffeine (EXCEDRIN MIGRAINE) [phone] MG per tablet Take by mouth every 6 (six) hours as needed for headache.    [provider]  atorvastatin (LIPITOR) 10 MG tablet Take 5 mg by mouth daily.     [provider]  atorvastatin (LIPITOR) 10 MG tablet Take 10 mg by mouth daily. 0.5 tablets by mouth at night.    [provider]  Black Cohosh-SoyIsoflav-Magnol (ESTROVEN MENOPAUSE RELIEF) CAPS Take by mouth at bedtime.     [provider]  calcium carbonate 1250 MG capsule Take 1,250 mg by mouth 2 (two) times daily with a meal.    [provider]  cholecalciferol (VITAMIN D) 400 UNITS TABS tablet Take 400 Units by mouth.    [provider]  co-enzyme Q-10 30 MG capsule Take 30 mg by mouth daily.     [provider]  donepezil (ARICEPT) 5 MG tablet Take 5 mg by mouth at bedtime.    [provider]  DULoxetine (CYMBALTA) 20 MG capsule Take 30 mg by mouth daily.     [provider]  losartan (COZAAR) 100 MG tablet Take 100 mg by mouth daily.     [provider]  Multiple Vitamin (MULTIVITAMIN) tablet Take 1 tablet by mouth daily.    [provider]  Nutritional Supplements (ESTROVEN PMS PO) Take by mouth.    [provider]  pantoprazole (PROTONIX) 40 MG tablet Take 40 mg by mouth 2 (two) times daily.    [provider]  sertraline (ZOLOFT) 25 MG tablet Take 25 mg by mouth daily. 2 tablets everay am.    [provider]  UNABLE TO FIND     [provider]  vitamin B-12 (CYANOCOBALAMIN) 100 MCG tablet Take 100 mcg by mouth daily.    [provider]     Allergies Ciprofloxacin and Morphine and related  Family History  Problem Relation Age of Onset  . Breast cancer Mother 57    . Diabetes Neg Hx   . Heart disease Neg Hx   . Colon cancer Neg Hx   . Ovarian cancer Neg Hx     Social History Social History   Tobacco Use  . Smoking status: Never Smoker  . Smokeless tobacco: Never Used  Substance Use Topics  . Alcohol use: No    Alcohol/week: 0.0 standard drinks  . Drug use: No    Review of Systems  Constitutional: No fever/chills Eyes: No visual changes.  ENT: No sore throat. Cardiovascular: Denies chest pain. Respiratory: Denies shortness of breath. Gastrointestinal: As above Genitourinary: As above Musculoskeletal: As above Skin: Negative for rash. Neurological: Negative for headaches or weakness   ____________________________________________   PHYSICAL EXAM:  VITAL SIGNS: ED Triage Vitals  Enc Vitals Group     BP 10/02/19  2028 (!) 161/62     Pulse Rate 10/02/19 2028 73     Resp 10/02/19 2028 18     Temp 10/02/19 2028 (!) 97.5 F (36.4 C)     Temp Source 10/02/19 2028 Oral     SpO2 10/02/19 2028 96 %     Weight 10/02/19 2029 99.8 kg (220 lb)     Height 10/02/19 2029 1.676 m (5\' 6" )     Head Circumference --      Peak Flow --      Pain Score 10/02/19 2031 7     Pain Loc --      Pain Edu? --      Excl. in [place]? --     Constitutional: Alert and oriented.  Mouth/Throat: Mucous membranes are moist.   Neck:  Painless ROM Cardiovascular: Normal rate, regular rhythm.  Good peripheral circulation. Respiratory: Normal respiratory effort.  No retractions. Gastrointestinal: Soft and nontender. No distention.  No CVA tenderness.  Reassuring exam  Musculoskeletal: Warm and well perfused Neurologic:  Normal speech and language. No gross focal neurologic deficits are appreciated.  Skin:  Skin is warm, dry and intact. No rash noted. Psychiatric: Mood and affect are normal. Speech and behavior are normal.  ____________________________________________   LABS (all labs ordered are listed, but only abnormal results are displayed)  Labs  Reviewed  COMPREHENSIVE METABOLIC PANEL - Abnormal; Notable for the following components:      Result Value   Glucose, Bld 125 (*)    Creatinine, Ser 1.16 (*)    Total Bilirubin 1.8 (*)    GFR calc non Af Amer 45 (*)    GFR calc Af Amer 52 (*)    All other components within normal limits  CBC  URINALYSIS, COMPLETE (UACMP) WITH MICROSCOPIC   ____________________________________________  EKG  None ____________________________________________  RADIOLOGY  CT abdomen pelvis is reassuring ____________________________________________   PROCEDURES  Procedure(s) performed: No  Procedures   Critical Care performed: No ____________________________________________   INITIAL IMPRESSION / ASSESSMENT AND PLAN / ED COURSE  Pertinent labs & imaging results that were available during my care of the patient were reviewed by me and considered in my medical decision making (see chart for details).  Patient presents with abdominal pain described above, symptoms have improved somewhat.  Still describes some discomfort in her flanks bilaterally.  Lab work is reassuring, pending urinalysis as she gave too small sample.  CT abdomen pelvis performed which is reassuring.  Anticipate discharge  ----------------------------------------- 11:41 AM on 10/03/2019 -----------------------------------------  Discussed CT results with patient, she is feeling much better and would like to go home, she does not want to attempt second urinalysis, I think this is reasonable given that she is improved, appropriate for discharge at this time    ____________________________________________   FINAL CLINICAL IMPRESSION(S) / ED DIAGNOSES  Final diagnoses:  Lower abdominal pain        Note:  This document was prepared using Dragon voice recognition software and may include unintentional dictation errors.   Lavonia Drafts, MD 10/03/19 [phone]

## 2019-10-03 NOTE — ED Notes (Signed)
Pt taken to CT.

## 2019-10-03 NOTE — ED Notes (Signed)
Pt updated on wait time. Pt verbalizes understanding.

## 2019-10-03 NOTE — ED Notes (Signed)
Leandra Kern, [phone]

## 2020-04-25 ENCOUNTER — Other Ambulatory Visit: Admission: RE | Admit: 2020-04-25 | Payer: BLUE CROSS/BLUE SHIELD | Source: Ambulatory Visit

## 2020-04-26 ENCOUNTER — Other Ambulatory Visit
Admission: RE | Admit: 2020-04-26 | Discharge: 2020-04-26 | Disposition: A | Discharge status: Home / Self Care | Payer: Medicare Other | Source: Ambulatory Visit | Attending: Gastroenterology | Admitting: Gastroenterology

## 2020-04-26 ENCOUNTER — Other Ambulatory Visit: Payer: Self-pay

## 2020-04-26 ENCOUNTER — Encounter: Payer: Self-pay | Admitting: *Deleted

## 2020-04-26 DIAGNOSIS — Z20822 Contact with and (suspected) exposure to covid-19: Secondary | ICD-10-CM | POA: Diagnosis not present

## 2020-04-26 DIAGNOSIS — Z01812 Encounter for preprocedural laboratory examination: Secondary | ICD-10-CM | POA: Diagnosis present

## 2020-04-26 LAB — SARS CORONAVIRUS 2 (TAT 6-24 HRS): SARS Coronavirus 2: NEGATIVE

## 2020-04-27 ENCOUNTER — Other Ambulatory Visit: Payer: Self-pay

## 2020-04-27 ENCOUNTER — Ambulatory Visit: Payer: Medicare Other | Admitting: Certified Registered"

## 2020-04-27 ENCOUNTER — Ambulatory Visit
Admission: RE | Admit: 2020-04-27 | Discharge: 2020-04-27 | Disposition: A | Discharge status: Home / Self Care | Payer: Medicare Other | Attending: Gastroenterology | Admitting: Gastroenterology

## 2020-04-27 ENCOUNTER — Encounter: Payer: Self-pay | Admitting: *Deleted

## 2020-04-27 ENCOUNTER — Encounter
Admission: RE | Disposition: A | Discharge status: Home / Self Care | Payer: Self-pay | Source: Home / Self Care | Attending: Gastroenterology

## 2020-04-27 DIAGNOSIS — K3189 Other diseases of stomach and duodenum: Secondary | ICD-10-CM | POA: Insufficient documentation

## 2020-04-27 DIAGNOSIS — M8580 Other specified disorders of bone density and structure, unspecified site: Secondary | ICD-10-CM | POA: Insufficient documentation

## 2020-04-27 DIAGNOSIS — M797 Fibromyalgia: Secondary | ICD-10-CM | POA: Insufficient documentation

## 2020-04-27 DIAGNOSIS — Z7982 Long term (current) use of aspirin: Secondary | ICD-10-CM | POA: Insufficient documentation

## 2020-04-27 DIAGNOSIS — I4891 Unspecified atrial fibrillation: Secondary | ICD-10-CM | POA: Insufficient documentation

## 2020-04-27 DIAGNOSIS — K228 Other specified diseases of esophagus: Secondary | ICD-10-CM | POA: Diagnosis not present

## 2020-04-27 DIAGNOSIS — E7800 Pure hypercholesterolemia, unspecified: Secondary | ICD-10-CM | POA: Diagnosis not present

## 2020-04-27 DIAGNOSIS — N183 Chronic kidney disease, stage 3 unspecified: Secondary | ICD-10-CM | POA: Diagnosis not present

## 2020-04-27 DIAGNOSIS — G4730 Sleep apnea, unspecified: Secondary | ICD-10-CM | POA: Insufficient documentation

## 2020-04-27 DIAGNOSIS — Z79899 Other long term (current) drug therapy: Secondary | ICD-10-CM | POA: Insufficient documentation

## 2020-04-27 DIAGNOSIS — M1990 Unspecified osteoarthritis, unspecified site: Secondary | ICD-10-CM | POA: Insufficient documentation

## 2020-04-27 DIAGNOSIS — Z8673 Personal history of transient ischemic attack (TIA), and cerebral infarction without residual deficits: Secondary | ICD-10-CM | POA: Diagnosis not present

## 2020-04-27 DIAGNOSIS — K2950 Unspecified chronic gastritis without bleeding: Secondary | ICD-10-CM | POA: Insufficient documentation

## 2020-04-27 DIAGNOSIS — I129 Hypertensive chronic kidney disease with stage 1 through stage 4 chronic kidney disease, or unspecified chronic kidney disease: Secondary | ICD-10-CM | POA: Insufficient documentation

## 2020-04-27 DIAGNOSIS — R12 Heartburn: Secondary | ICD-10-CM | POA: Diagnosis present

## 2020-04-27 DIAGNOSIS — K449 Diaphragmatic hernia without obstruction or gangrene: Secondary | ICD-10-CM | POA: Insufficient documentation

## 2020-04-27 HISTORY — PX: ESOPHAGOGASTRODUODENOSCOPY (EGD) WITH PROPOFOL: SHX5813

## 2020-04-27 SURGERY — ESOPHAGOGASTRODUODENOSCOPY (EGD) WITH PROPOFOL
Anesthesia: General

## 2020-04-27 MED ORDER — PROPOFOL 10 MG/ML IV BOLUS
INTRAVENOUS | Status: DC | PRN
  Administered 2020-04-27: 100 mg via INTRAVENOUS

## 2020-04-27 MED ORDER — LIDOCAINE HCL (PF) 2 % IJ SOLN
INTRAMUSCULAR | Status: AC
  Filled 2020-04-27: qty 5

## 2020-04-27 MED ORDER — PROPOFOL 10 MG/ML IV BOLUS
INTRAVENOUS | Status: AC
  Filled 2020-04-27: qty 40

## 2020-04-27 MED ORDER — LIDOCAINE HCL (CARDIAC) PF 100 MG/5ML IV SOSY
PREFILLED_SYRINGE | INTRAVENOUS | Status: DC | PRN
  Administered 2020-04-27: 100 mg via INTRAVENOUS

## 2020-04-27 MED ORDER — SODIUM CHLORIDE 0.9 % IV SOLN: INTRAVENOUS | Status: DC

## 2020-04-27 NOTE — H&P (Signed)
Outpatient short stay form Pre-procedure 04/27/2020 2:02 PM Jaclyn Miyamoto MD, MPH  Primary Physician: Dr. 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 No blood thinners. No family history of GI malignancies.    Current Facility-Administered Medications:  .  0.9 %  sodium chloride infusion, , Intravenous, Continuous, [name], [name], MD, Last Rate: 20 mL/hr at 04/27/20 1358, New Bag at 04/27/20 1358  Medications Prior to Admission  Medication Sig Dispense Refill Last Dose  . amLODipine (NORVASC) 5 MG tablet Take 5 mg by mouth daily.   04/27/2020 at 0300  . ascorbic acid (VITAMIN C) 1000 MG tablet Take 1,000 mg by mouth daily.   04/26/2020 at Unknown time  . aspirin EC 81 MG tablet Take 81 mg by mouth daily.   Past Week at Unknown time  . atorvastatin (LIPITOR) 10 MG tablet Take 5 mg by mouth daily.    04/26/2020 at Unknown time  . Black Cohosh-SoyIsoflav-Magnol (ESTROVEN MENOPAUSE RELIEF) CAPS Take by mouth at bedtime.    Past Month at Unknown time  . cholecalciferol (VITAMIN D) 400 UNITS TABS tablet Take 400 Units by mouth.   04/26/2020 at Unknown time  . co-enzyme Q-10 30 MG capsule Take 30 mg by mouth daily.    Past Week at Unknown time  . donepezil (ARICEPT) 5 MG tablet Take 5 mg by mouth at bedtime.   04/26/2020 at Unknown time  . DULoxetine (CYMBALTA) 20 MG capsule Take 30 mg by mouth daily.    04/26/2020 at Unknown time  . losartan (COZAAR) 100 MG tablet Take 100 mg by mouth daily.    04/26/2020 at Unknown time  . Multiple Vitamin (MULTIVITAMIN) tablet Take 1 tablet by mouth daily.   Past Week at Unknown time  . Nutritional Supplements (ESTROVEN PMS PO) Take by mouth.   Past Month at Unknown time  . pantoprazole (PROTONIX) 40 MG tablet Take 40 mg by mouth 2 (two) times daily.   Past Week at Unknown time  . vitamin B-12 (CYANOCOBALAMIN) 100 MCG tablet Take 100 mcg by  mouth daily.   04/26/2020 at Unknown time  . AMLODIPINE-ATORVASTATIN PO Take by mouth.     Marland Kitchen aspirin-acetaminophen-caffeine (EXCEDRIN MIGRAINE) 250-250-65 MG per tablet Take by mouth every 6 (six) hours as needed for headache.     Marland Kitchen atorvastatin (LIPITOR) 10 MG tablet Take 10 mg by mouth daily. 0.5 tablets by mouth at night.     . calcium carbonate 1250 MG capsule Take 1,250 mg by mouth 2 (two) times daily with a meal. (Patient not taking: Reported on 04/27/2020)   Not Taking at Unknown time  . sertraline (ZOLOFT) 25 MG tablet Take 25 mg by mouth daily. 2 tablets everay am. (Patient not taking: Reported on 04/27/2020)   Not Taking at Unknown time  . UNABLE TO FIND         Allergies  Allergen Reactions  . Ciprofloxacin Nausea And Vomiting  . Morphine And Related Nausea And Vomiting     Past Medical History:  Diagnosis Date  . Adenomatous colon polyp 07/31/2015  . Anemia    resolved from the beginning of this year  . Atrial fibrillation [facility])    cardioversion March 2004  . Cholelithiasis   . Chronic gastritis   . CKD (chronic kidney disease)    chronic kidney disease stage III  . Diverticulosis   .  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 DJD (degenerative joint disease)   . DJD (degenerative joint disease)   . Elbow injury   . Fibromyalgia   . HH (hiatus hernia)   . Hypercholesterolemia    controlled with meds  . Hypertension    controlled with meds, checks regularly  . Irregular Z line of esophagus 11/21/2014  . Liver cyst   . Osteopenia   . PPD positive   . Reflux esophagitis   . Renal insufficiency   . Sleep apnea   . Stroke [facility])    treated by Dr. Brigitte Pulse    Review of systems:  Otherwise negative.    Physical Exam  Gen: Alert, oriented. Appears stated age.  HEENT: PERRLA. Lungs: no respiratory distress Abd: soft, benign, no masses.  Ext: No edema    Planned procedures: Proceed with EGD. The patient understands the  nature of the planned procedure, indications, risks, alternatives and potential complications including but not limited to bleeding, infection, perforation, damage to internal organs and possible oversedation/side effects from anesthesia. The patient agrees and gives consent to proceed.  Please refer to procedure notes for findings, recommendations and patient disposition/instructions.     Jaclyn Miyamoto MD, MPH Gastroenterology 04/27/2020  2:02 PM

## 2020-04-27 NOTE — Anesthesia Preprocedure Evaluation (Signed)
Anesthesia Evaluation  Patient identified by MRN, date of birth, ID band Patient awake    Reviewed: Allergy & Precautions, NPO status , Patient's Chart, lab work & pertinent test results  History of Anesthesia Complications Negative for: history of anesthetic complications  Airway Mallampati: II  TM Distance: >3 FB Neck ROM: Full    Dental no notable dental hx. (+) Dental Advidsory Given   Pulmonary neg shortness of breath, sleep apnea and Continuous Positive Airway Pressure Ventilation , neg COPD, neg recent URI,    breath sounds clear to auscultation- rhonchi (-) wheezing      Cardiovascular hypertension, Pt. on medications (-) angina(-) CAD, (-) Past MI, (-) Cardiac Stents and (-) CABG  Rhythm:Regular Rate:Normal - Systolic murmurs and - Diastolic murmurs    Neuro/Psych neg Seizures CVA (balance problems) negative psych ROS   GI/Hepatic Neg liver ROS, hiatal hernia,   Endo/Other  negative endocrine ROSneg diabetes  Renal/GU Renal InsufficiencyRenal disease     Musculoskeletal  (+) Arthritis , Fibromyalgia -  Abdominal (+) + obese,   Peds  Hematology  (+) Blood dyscrasia, anemia ,   Anesthesia Other Findings Past Medical History: 07/31/2015: Adenomatous colon polyp No date: Anemia     Comment:  resolved from the beginning of this year No date: Atrial fibrillation ([facility])     Comment:  cardioversion March 2004 No date: Cholelithiasis No date: Chronic gastritis No date: CKD (chronic kidney disease)     Comment:  chronic kidney disease stage III No date: Diverticulosis No date: DJD (degenerative joint disease)     Comment:  B knees, B knee replacements, 1991/1996 No date: DJD (degenerative joint disease) No date: DJD (degenerative joint disease) No date: Elbow injury No date: Fibromyalgia No date: HH (hiatus hernia) No date: Hypercholesterolemia     Comment:  controlled with meds No date: Hypertension      Comment:  controlled with meds, checks regularly 11/21/2014: Irregular Z line of esophagus No date: Liver cyst No date: Osteopenia 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 No date: Reflux esophagitis No date: Renal insufficiency No date: Sleep apnea No date: Stroke [facility])     Comment:  treated by Dr. Brigitte Pulse   Reproductive/Obstetrics                             Anesthesia Physical  Anesthesia Plan  ASA: III  Anesthesia Plan: General   Post-op Pain Management:    Induction: Intravenous  PONV Risk Score and Plan: 2 and Propofol infusion and TIVA  Airway Management Planned: Natural Airway and Nasal Cannula  Additional Equipment:   Intra-op Plan:   Post-operative Plan:   Informed Consent: I have reviewed the patients History and Physical, chart, labs and discussed the procedure including the risks, benefits and alternatives for the proposed anesthesia with the patient or authorized representative who has indicated his/her understanding and acceptance.     Dental advisory given  Plan Discussed with: CRNA and Anesthesiologist  Anesthesia Plan Comments:         Anesthesia Quick Evaluation

## 2020-04-27 NOTE — Op Note (Addendum)
[facility] Gastroenterology Patient Name: Jaclyn Galvan Procedure Date: 04/27/2020 1:54 PM MRN: [id number] Account #: [id number] Date of Birth: October 09, 1940 Admit Type: Outpatient Age: 79 Room: [facility] ENDO ROOM 3 Gender: Female Note Status: Supervisor Override Procedure:             Upper GI endoscopy Indications:           Surveillance for malignancy due to personal history of                         Barrett's esophagus, Surveillance for malignancy due                         to personal history of premalignant condition,                         Follow-up of gastro-esophageal reflux disease Providers:             Andrey Farmer MD, MD Medicines:             Monitored Anesthesia Care Complications:         No immediate complications. Estimated blood loss:                         Minimal. Procedure:             Pre-Anesthesia Assessment:                        - Prior to the procedure, a History and Physical was                         performed, and patient medications and allergies were                         reviewed. The patient is competent. The risks and                         benefits of the procedure and the sedation options and                         risks were discussed with the patient. All questions                         were answered and informed consent was obtained.                         Patient identification and proposed procedure were                         verified by the physician, the nurse, the anesthetist                         and the technician in the endoscopy suite. Mental                         Status Examination: alert and oriented. Airway                         Examination: normal oropharyngeal airway and neck  mobility. Respiratory Examination: clear to                         auscultation. CV Examination: normal. Prophylactic                         Antibiotics: The patient does not require prophylactic                          antibiotics. Prior Anticoagulants: The patient has                         taken no previous anticoagulant or antiplatelet                         agents. ASA Grade Assessment: II - A patient with mild                         systemic disease. After reviewing the risks and                         benefits, the patient was deemed in satisfactory                         condition to undergo the procedure. The anesthesia                         plan was to use monitored anesthesia care (MAC).                         Immediately prior to administration of medications,                         the patient was re-assessed for adequacy to receive                         sedatives. The heart rate, respiratory rate, oxygen                         saturations, blood pressure, adequacy of pulmonary                         ventilation, and response to care were monitored                         throughout the procedure. The physical status of the                         patient was re-assessed after the procedure.                        After obtaining informed consent, the endoscope was                         passed under direct vision. Throughout the procedure,                         the patient's blood pressure, pulse, and oxygen  saturations were monitored continuously. The Endoscope                         was introduced through the mouth, and advanced to the                         second part of duodenum. The upper GI endoscopy was                         accomplished without difficulty. The patient tolerated                         the procedure well. Findings:      The Z-line was variable but did not appear meet criteria for Barrett's       Esophagus. There was one tongue that extended proximal but was < 1cm in       length. Examined under NBI without irregularity.      The exam of the esophagus was otherwise normal.      A 3 cm hiatal hernia  was present.      The entire examined stomach was normal.      A mild prior polypectomy deformity was found in the second portion of       the duodenum. Biopsies were taken with a cold forceps for histology.       Estimated blood loss was minimal. Impression:            - Z-line variable.                        - 3 cm hiatal hernia.                        - Normal stomach.                        - Duodenal deformity. Biopsied. Recommendation:        - Discharge patient to home.                        - Resume previous diet.                        - Continue present medications.                        - Await pathology results.                        - Return to referring physician as previously                         scheduled. Procedure Code(s):     --- Professional ---                        [phone], Esophagogastroduodenoscopy, flexible,                         transoral; with biopsy, single or multiple Diagnosis Code(s):     --- Professional ---  K22.8, Other specified diseases of esophagus                        K44.9, Diaphragmatic hernia without obstruction or                         gangrene                        K31.89, Other diseases of stomach and duodenum                        R12, Heartburn CPT copyright 2019 [employer]. All rights reserved. The codes documented in this report are preliminary and upon coder review may  be revised to meet current compliance requirements. Andrey Farmer, MD Andrey Farmer MD, MD 04/27/2020 2:36:25 PM Number of Addenda: 0 Note Initiated On: 04/27/2020 1:54 PM Estimated Blood Loss:  Estimated blood loss was minimal.      [facility]

## 2020-04-27 NOTE — Transfer of Care (Signed)
Immediate Anesthesia Transfer of Care Note  Patient: Jaclyn Galvan  Procedure(s) Performed: ESOPHAGOGASTRODUODENOSCOPY (EGD) WITH PROPOFOL (N/A )  Patient Location: PACU and Endoscopy Unit  Anesthesia Type:General  Level of Consciousness: sedated  Airway & Oxygen Therapy: Patient Spontanous Breathing and Patient connected to nasal cannula oxygen  Post-op Assessment: Report given to RN and Post -op Vital signs reviewed and stable  Post vital signs: Reviewed and stable  Last Vitals:  Vitals Value Taken Time  BP    Temp    Pulse 82 04/27/20 1430  Resp 19 04/27/20 1430  SpO2 95 % 04/27/20 1430  Vitals shown include unvalidated device data.  Last Pain:  Vitals:   04/27/20 1324  TempSrc: Temporal  PainSc: 0-No pain         Complications: No complications documented.

## 2020-04-30 ENCOUNTER — Encounter: Payer: Self-pay | Admitting: Gastroenterology

## 2020-05-01 LAB — SURGICAL PATHOLOGY

## 2020-05-01 NOTE — Anesthesia Postprocedure Evaluation (Signed)
Anesthesia Post Note  Patient: Jaclyn Galvan  Procedure(s) Performed: ESOPHAGOGASTRODUODENOSCOPY (EGD) WITH PROPOFOL (N/A )  Patient location during evaluation: Endoscopy Anesthesia Type: General Level of consciousness: awake and alert Pain management: pain level controlled Vital Signs Assessment: post-procedure vital signs reviewed and stable Respiratory status: spontaneous breathing, nonlabored ventilation, respiratory function stable and patient connected to nasal cannula oxygen Cardiovascular status: blood pressure returned to baseline and stable Postop Assessment: no apparent nausea or vomiting Anesthetic complications: no   No complications documented.   Last Vitals:  Vitals:   04/27/20 1501 04/27/20 1511  BP: (!) 159/85 (!) 149/81  Pulse: 69 67  Resp: 15 14  Temp:    SpO2: 98% 98%    Last Pain:  Vitals:   04/27/20 1511  TempSrc:   PainSc: 0-No pain                 Martha Clan

## 2020-05-23 ENCOUNTER — Other Ambulatory Visit: Payer: Self-pay | Admitting: Internal Medicine

## 2020-05-23 DIAGNOSIS — Z1231 Encounter for screening mammogram for malignant neoplasm of breast: Secondary | ICD-10-CM

## 2020-06-14 ENCOUNTER — Ambulatory Visit
Admission: RE | Admit: 2020-06-14 | Discharge: 2020-06-14 | Disposition: A | Discharge status: Home / Self Care | Payer: Medicare Other | Source: Ambulatory Visit | Attending: Internal Medicine | Admitting: Internal Medicine

## 2020-06-14 DIAGNOSIS — Z1231 Encounter for screening mammogram for malignant neoplasm of breast: Secondary | ICD-10-CM | POA: Diagnosis present

## 2021-02-08 ENCOUNTER — Emergency Department: Payer: Medicare Other

## 2021-02-08 ENCOUNTER — Emergency Department
Admission: EM | Admit: 2021-02-08 | Discharge: 2021-02-08 | Disposition: A | Discharge status: Home / Self Care | Payer: Medicare Other | Attending: Emergency Medicine | Admitting: Emergency Medicine

## 2021-02-08 DIAGNOSIS — N183 Chronic kidney disease, stage 3 unspecified: Secondary | ICD-10-CM | POA: Insufficient documentation

## 2021-02-08 DIAGNOSIS — Z7982 Long term (current) use of aspirin: Secondary | ICD-10-CM | POA: Insufficient documentation

## 2021-02-08 DIAGNOSIS — S0101XA Laceration without foreign body of scalp, initial encounter: Secondary | ICD-10-CM | POA: Insufficient documentation

## 2021-02-08 DIAGNOSIS — M25561 Pain in right knee: Secondary | ICD-10-CM | POA: Insufficient documentation

## 2021-02-08 DIAGNOSIS — M25562 Pain in left knee: Secondary | ICD-10-CM | POA: Insufficient documentation

## 2021-02-08 DIAGNOSIS — Z96653 Presence of artificial knee joint, bilateral: Secondary | ICD-10-CM | POA: Diagnosis not present

## 2021-02-08 DIAGNOSIS — W19XXXA Unspecified fall, initial encounter: Secondary | ICD-10-CM

## 2021-02-08 DIAGNOSIS — M542 Cervicalgia: Secondary | ICD-10-CM | POA: Diagnosis not present

## 2021-02-08 DIAGNOSIS — S0990XA Unspecified injury of head, initial encounter: Secondary | ICD-10-CM | POA: Diagnosis present

## 2021-02-08 DIAGNOSIS — I129 Hypertensive chronic kidney disease with stage 1 through stage 4 chronic kidney disease, or unspecified chronic kidney disease: Secondary | ICD-10-CM | POA: Insufficient documentation

## 2021-02-08 DIAGNOSIS — Z79899 Other long term (current) drug therapy: Secondary | ICD-10-CM | POA: Diagnosis not present

## 2021-02-08 DIAGNOSIS — W01198A Fall on same level from slipping, tripping and stumbling with subsequent striking against other object, initial encounter: Secondary | ICD-10-CM | POA: Insufficient documentation

## 2021-02-08 MED ORDER — LIDOCAINE-EPINEPHRINE-TETRACAINE (LET) TOPICAL GEL
3.0000 mL | Freq: Once | TOPICAL | Status: AC
  Administered 2021-02-08: 3 mL via TOPICAL
  Filled 2021-02-08: qty 3

## 2021-02-08 NOTE — ED Provider Notes (Signed)
[facility] Emergency Department Provider Note  ____________________________________________   Event Date/Time   First MD Initiated Contact with Patient 02/08/21 2153     (approximate)  I have reviewed the triage vital signs and the nursing notes.   HISTORY  Chief Complaint Fall  HPI Jaclyn Galvan is a 80 y.o. female who reports to the emergency department for evaluation of fall with head injury.  Patient states that she was in her slippers, tripped and hit the left side of her head into the corner of a wall.  She denies any loss of consciousness at that time.  She does report some associated neck tenderness.  She also reports that she struck her bilateral anterior aspect of her knees during the fall with some pain there, but denies pain in any other location.  She states that she takes a daily baby aspirin but otherwise is not on any blood thinners.  She has not had any photophobia, blurred vision, dizziness nausea or vomiting since the fall.         Past Medical History:  Diagnosis Date  . Adenomatous colon polyp 07/31/2015  . Anemia    resolved from the beginning of this year  . Atrial fibrillation [facility])    cardioversion March 2004  . Cholelithiasis   . Chronic gastritis   . CKD (chronic kidney disease)    chronic kidney disease stage III  . Diverticulosis   . 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 DJD (degenerative joint disease)   . DJD (degenerative joint disease)   . Elbow injury   . Fibromyalgia   . HH (hiatus hernia)   . Hypercholesterolemia    controlled with meds  . Hypertension    controlled with meds, checks regularly  . Irregular Z line of esophagus 11/21/2014  . Liver cyst   . Osteopenia   . PPD positive   . Reflux esophagitis   . Renal insufficiency   . Sleep apnea   . Stroke [facility])    treated by Dr. Brigitte Pulse    Patient Active Problem List   Diagnosis Date Noted  . Absolute anemia  06/26/2015  . A-fib ([place]) 06/26/2015  . Hepatic cyst 06/26/2015  . BP (high blood pressure) 06/26/2015  . HLD (hyperlipidemia) 06/26/2015  . H/O: osteoarthritis 06/26/2015  . Arthritis, degenerative 06/26/2015  . Chronic kidney disease (CKD), stage III (moderate) ([place]) 06/26/2015  . Osteopenia 06/26/2015  . Obstructive apnea 04/19/2015    Past Surgical History:  Procedure Laterality Date  . ADENOIDECTOMY    . Adenomatous polyp of duodenum    . APPENDECTOMY    . BACK SURGERY    . BILATERAL OOPHORECTOMY Right   . BREAST BIOPSY Right 09/25/2015   stereo,benign  . BREAST EXCISIONAL BIOPSY Right    1988 negative  . BREAST EXCISIONAL BIOPSY Left    ? when,benign  . CHOLECYSTECTOMY    . COLONOSCOPY WITH PROPOFOL N/A 10/29/2018   Procedure: COLONOSCOPY WITH PROPOFOL;  Surgeon: [name], MD;  Location: [facility] ENDOSCOPY;  Service: Endoscopy;  Laterality: N/A;  . DILATION AND CURETTAGE OF UTERUS  1966  . ESOPHAGOGASTRODUODENOSCOPY N/A 10/29/2018   Procedure: ESOPHAGOGASTRODUODENOSCOPY (EGD);  Surgeon: [name], MD;  Location: [facility] ENDOSCOPY;  Service: Endoscopy;  Laterality: N/A;  . ESOPHAGOGASTRODUODENOSCOPY (EGD) WITH PROPOFOL N/A 07/31/2015   Procedure: ESOPHAGOGASTRODUODENOSCOPY (EGD) WITH PROPOFOL;  Surgeon: [name], MD;  Location: [facility] ENDOSCOPY;  Service: Endoscopy;  Laterality: N/A;  .  ESOPHAGOGASTRODUODENOSCOPY (EGD) WITH PROPOFOL N/A 05/06/2016   Procedure: ESOPHAGOGASTRODUODENOSCOPY (EGD) WITH PROPOFOL;  Surgeon: [name], MD;  Location: [facility] ENDOSCOPY;  Service: Endoscopy;  Laterality: N/A;  . ESOPHAGOGASTRODUODENOSCOPY (EGD) WITH PROPOFOL N/A 10/30/2017   Procedure: ESOPHAGOGASTRODUODENOSCOPY (EGD) WITH PROPOFOL;  Surgeon: [name], MD;  Location: [facility] ENDOSCOPY;  Service: Endoscopy;  Laterality: N/A;  . ESOPHAGOGASTRODUODENOSCOPY (EGD) WITH PROPOFOL N/A 04/27/2020   Procedure: ESOPHAGOGASTRODUODENOSCOPY (EGD) WITH PROPOFOL;  Surgeon:  Lesly Rubenstein, MD;  Location: [facility] ENDOSCOPY;  Service: Endoscopy;  Laterality: N/A;  . JOINT REPLACEMENT Right    total knee Dr. Sabra Heck with excessive postoperative bleeding  . RHIZOTOMY    . TONSILLECTOMY    . TOTAL KNEE ARTHROPLASTY Bilateral    2001,2006  . TUBAL LIGATION  1971    Prior to Admission medications   Medication Sig Start Date End Date Taking? Authorizing Provider  amLODipine (NORVASC) 5 MG tablet Take 5 mg by mouth daily.    [provider]  AMLODIPINE-ATORVASTATIN PO Take by mouth.    [provider]  ascorbic acid (VITAMIN C) 1000 MG tablet Take 1,000 mg by mouth daily.    [provider]  aspirin EC 81 MG tablet Take 81 mg by mouth daily.    [provider]  aspirin-acetaminophen-caffeine (EXCEDRIN MIGRAINE) [phone] MG per tablet Take by mouth every 6 (six) hours as needed for headache.    [provider]  atorvastatin (LIPITOR) 10 MG tablet Take 5 mg by mouth daily.     [provider]  atorvastatin (LIPITOR) 10 MG tablet Take 10 mg by mouth daily. 0.5 tablets by mouth at night.    [provider]  Black Cohosh-SoyIsoflav-Magnol (ESTROVEN MENOPAUSE RELIEF) CAPS Take by mouth at bedtime.     [provider]  calcium carbonate 1250 MG capsule Take 1,250 mg by mouth 2 (two) times daily with a meal. Patient not taking: Reported on 04/27/2020    [provider]  cholecalciferol (VITAMIN D) 400 UNITS TABS tablet Take 400 Units by mouth.    [provider]  co-enzyme Q-10 30 MG capsule Take 30 mg by mouth daily.     [provider]  donepezil (ARICEPT) 5 MG tablet Take 5 mg by mouth at bedtime.    [provider]  DULoxetine (CYMBALTA) 20 MG capsule Take 30 mg by mouth daily.     [provider]  losartan (COZAAR) 100 MG tablet Take 100 mg by mouth daily.     [provider]  Multiple Vitamin (MULTIVITAMIN) tablet Take 1 tablet by mouth daily.     [provider]  Nutritional Supplements (ESTROVEN PMS PO) Take by mouth.    [provider]  pantoprazole (PROTONIX) 40 MG tablet Take 40 mg by mouth 2 (two) times daily.    [provider]  sertraline (ZOLOFT) 25 MG tablet Take 25 mg by mouth daily. 2 tablets everay am. Patient not taking: Reported on 04/27/2020    [provider]  [place]     [provider]  vitamin B-12 (CYANOCOBALAMIN) 100 MCG tablet Take 100 mcg by mouth daily.    [provider]    Allergies Ciprofloxacin and Morphine and related  Family History  Problem Relation Age of Onset  . Breast cancer Mother 42  . Diabetes Neg Hx   . Heart disease Neg Hx   . Colon cancer Neg Hx   . Ovarian cancer Neg Hx     Social History  Social History   Tobacco Use  . Smoking status: Never Smoker  . Smokeless tobacco: Never Used  Vaping Use  . Vaping Use: Never used  Substance Use Topics  . Alcohol use: No    Alcohol/week: 0.0 standard drinks  . Drug use: No    Review of Systems Constitutional: No fever/chills Eyes: No visual changes. ENT: No sore throat. Cardiovascular: Denies chest pain. Respiratory: Denies shortness of breath. Gastrointestinal: No abdominal pain.  No nausea, no vomiting.  No diarrhea.  No constipation. Genitourinary: Negative for dysuria. Musculoskeletal: + Neck pain, + bilateral knee pain, negative for back pain. Skin: + Head laceration, negative for rash. Neurological: Negative for headaches, focal weakness or numbness.   ____________________________________________   PHYSICAL EXAM:  VITAL SIGNS: ED Triage Vitals  Enc Vitals Group     BP 02/08/21 2043 (!) 163/67     Pulse Rate 02/08/21 2043 85     Resp 02/08/21 2043 16     Temp 02/08/21 2043 97.7 F (36.5 C)     Temp Source 02/08/21 2043 Oral     SpO2 02/08/21 2043 99 %     Weight 02/08/21 2044 200 lb (90.7 kg)     Height 02/08/21 2044 5\' 6"  (1.676 m)     Head  Circumference --      Peak Flow --      Pain Score 02/08/21 2042 0     Pain Loc --      Pain Edu? --      Excl. in [place]? --    Constitutional: Alert and oriented. Well appearing and in no acute distress. Eyes: Conjunctivae are normal. PERRL. EOMI. Head: There is a laceration to the left side of the scalp in the parietal region with associated nearby dried blood.  No other obvious trauma noted. Nose: No congestion/rhinnorhea. Mouth/Throat: Mucous membranes are moist.  Oropharynx non-erythematous. Neck: No stridor.  There is no tenderness to palpation the midline of the cervical spine, there is bilateral paraspinal tenderness to palpation. Cardiovascular: Normal rate, regular rhythm. Grossly normal heart sounds.  Good peripheral circulation. Respiratory: Normal respiratory effort.  No retractions. Lungs CTAB. Gastrointestinal: Soft and nontender. No distention. No abdominal bruits. No CVA tenderness. Musculoskeletal: There is tenderness to palpation the bilateral knees.  Range of motion is equal bilaterally.  Previously healed surgical scars for knee replacements noted. Neurologic:  Normal speech and language. No gross focal neurologic deficits are appreciated. No gait instability. Skin:  Skin is warm, dry and intact except for left-sided scalp laceration as described above. No rash noted. Psychiatric: Mood and affect are normal. Speech and behavior are normal.  ____________________________________________  RADIOLOGY  Official radiology report(s): CT Head Wo Contrast  Result Date: 02/08/2021 CLINICAL DATA:  Status post trauma. EXAM: CT HEAD WITHOUT CONTRAST TECHNIQUE: Contiguous axial images were obtained from the base of the skull through the vertex without intravenous contrast. COMPARISON:  November 24, 2016 FINDINGS: Brain: There is mild cerebral atrophy with widening of the extra-axial spaces and ventricular dilatation. There are areas of decreased attenuation within the white matter  tracts of the supratentorial brain, consistent with microvascular disease changes. A chronic anterior right thalamic lacunar infarct is seen. Vascular: No hyperdense vessel or unexpected calcification. Skull: Normal. Negative for fracture or focal lesion. Sinuses/Orbits: No acute finding. Other: Mild left frontal parietal scalp soft tissue swelling is seen. IMPRESSION: 1. Generalized cerebral atrophy. 2. Mild left frontal parietal scalp soft tissue swelling without an acute intracranial abnormality. Electronically Signed   By:  Virgina Norfolk M.D.   On: 02/08/2021 21:24   CT Cervical Spine Wo Contrast  Result Date: 02/08/2021 CLINICAL DATA:  Status post fall. EXAM: CT CERVICAL SPINE WITHOUT CONTRAST TECHNIQUE: Multidetector CT imaging of the cervical spine was performed without intravenous contrast. Multiplanar CT image reconstructions were also generated. COMPARISON:  None. FINDINGS: Alignment: Normal. Skull base and vertebrae: No acute fracture. No primary bone lesion or focal pathologic process. Soft tissues and spinal canal: No prevertebral fluid or swelling. No visible canal hematoma. Disc levels: Moderate to marked severity endplate sclerosis is seen at the levels of C2-C3, C3-C4, C4-C5, C5-C6 and C6-C7. There is marked severity narrowing of the anterior atlantoaxial articulation. Marked severity intervertebral disc space narrowing is noted at the levels of C2-C3, C3-C4, C4-C5, C5-C6 and C6-C7. Moderate to marked severity bilateral multilevel facet joint hypertrophy is noted. Upper chest: Negative. Other: None. IMPRESSION: 1. Marked severity multilevel degenerative changes. 2. No acute cervical spine fracture or subluxation. Electronically Signed   By: Virgina Norfolk M.D.   On: 02/08/2021 21:27   DG Knee Complete 4 Views Left  Result Date: 02/08/2021 CLINICAL DATA:  Bilateral knee pain after fall EXAM: LEFT KNEE - COMPLETE 4+ VIEW COMPARISON:  None. FINDINGS: Status post left knee replacement with  intact hardware and normal alignment. No definitive fracture is seen. No significant knee effusion IMPRESSION: Status post left knee replacement without acute osseous abnormality Electronically Signed   By: Donavan Foil M.D.   On: 02/08/2021 22:53   DG Knee Complete 4 Views Right  Result Date: 02/08/2021 CLINICAL DATA:  Fall with knee pain EXAM: RIGHT KNEE - COMPLETE 4+ VIEW COMPARISON:  None. FINDINGS: Status post right knee replacement. No fracture or malalignment. No significant knee effusion. IMPRESSION: Knee replacement without acute osseous abnormality Electronically Signed   By: Donavan Foil M.D.   On: 02/08/2021 22:55    ____________________________________________   PROCEDURES  Procedure(s) performed (including Critical Care):  Marland KitchenMarland KitchenLaceration Repair  Date/Time: 02/08/2021 11:46 PM Performed by: Marlana Salvage, PA Authorized by: Marlana Salvage, PA   Consent:    Consent obtained:  Verbal   Consent given by:  Patient   Risks, benefits, and alternatives were discussed: yes     Risks discussed:  Infection, need for additional repair, pain, retained foreign body and poor cosmetic result   Alternatives discussed:  No treatment Universal protocol:    Procedure explained and questions answered to patient or proxy's satisfaction: yes     Imaging studies available: yes     Patient identity confirmed:  Verbally with patient Anesthesia:    Anesthesia method:  Topical application   Topical anesthetic:  LET Laceration details:    Location:  Scalp   Scalp location:  L parietal   Length (cm):  3   Depth (mm):  5 Exploration:    Imaging obtained comment:  CT   Imaging outcome: foreign body not noted   Treatment:    Area cleansed with:  Povidone-iodine and saline   Amount of cleaning:  Standard   Irrigation method:  Syringe and tap Skin repair:    Repair method:  Staples   Number of staples:  4 Approximation:    Approximation:  Close Repair type:    Repair type:   Simple Post-procedure details:    Dressing:  Open (no dressing)   Procedure completion:  Tolerated well, no immediate complications     ____________________________________________   INITIAL IMPRESSION / ASSESSMENT AND PLAN / ED COURSE  As part  of my medical decision making, I reviewed the following data within the electronic MEDICAL RECORD NUMBER Notes from prior ED visits and [place] Controlled Substance Database        Patient is a 80 year old female who reports to the emergency department for evaluation of reported mechanical fall with left-sided scalp injury and bilateral knee pain.  See HPI for further details.  In triage, the patient is mildly hypertensive but otherwise has normal vital signs.  On physical exam, the patient does have a roughly 3 to 4 cm laceration of the left parietal region with controlled bleeding.  Otherwise she is neurovascularly intact.  She does also have bilateral knee tenderness but has been ambulatory since the injury.  CT was obtained of the head and neck given her age and injury and is negative for acute pathology.  X-rays were obtained of the bilateral knees and are also negative.  4 staples were placed in the laceration as described in the above procedure note.  Return precautions were discussed and the patient is stable at this time for follow-up with primary care      ____________________________________________   FINAL CLINICAL IMPRESSION(S) / ED DIAGNOSES  Final diagnoses:  Laceration of scalp, initial encounter  Fall, initial encounter  Acute pain of both knees     ED Discharge Orders    None      *Please note:  ARELIE KUZEL was evaluated in Emergency Department on 02/08/2021 for the symptoms described in the history of present illness. She was evaluated in the context of the global COVID-19 pandemic, which necessitated consideration that the patient might be at risk for infection with the SARS-CoV-2 virus that causes COVID-19. Institutional  protocols and algorithms that pertain to the evaluation of patients at risk for COVID-19 are in a state of rapid change based on information released by regulatory bodies including the CDC and federal and state organizations. These policies and algorithms were followed during the patient's care in the ED.  Some ED evaluations and interventions may be delayed as a result of limited staffing during and the pandemic.*   Note:  This document was prepared using Dragon voice recognition software and may include unintentional dictation errors.   Marlana Salvage, PA 02/08/21 2348    Vanessa [place], MD 02/09/21 [phone]

## 2021-02-08 NOTE — Discharge Instructions (Addendum)
Please follow-up with your primary care to have your staples evaluated and likely removed in 7 days.  Return to the emergency department if you experience any worsening or changes in the interim.

## 2021-02-08 NOTE — ED Triage Notes (Addendum)
Pt reports tripping in her slippers and falling, hitting head on the corner of a wall. Denies LOC, neck pain, or headache. Denies blood thinners. Laceration to left side of scalp, bleeding controlled at this time Pt denies any dizzines/CP/SOB before or after fall.

## 2021-10-04 ENCOUNTER — Other Ambulatory Visit: Payer: Self-pay | Admitting: Obstetrics and Gynecology

## 2021-10-04 ENCOUNTER — Other Ambulatory Visit: Payer: Self-pay | Admitting: Internal Medicine

## 2021-10-04 DIAGNOSIS — Z1231 Encounter for screening mammogram for malignant neoplasm of breast: Secondary | ICD-10-CM

## 2021-10-11 ENCOUNTER — Other Ambulatory Visit: Payer: Self-pay

## 2021-10-11 ENCOUNTER — Ambulatory Visit
Admission: RE | Admit: 2021-10-11 | Discharge: 2021-10-11 | Disposition: A | Discharge status: Home / Self Care | Payer: Medicare Other | Source: Ambulatory Visit | Attending: Internal Medicine | Admitting: Internal Medicine

## 2021-10-11 DIAGNOSIS — Z1231 Encounter for screening mammogram for malignant neoplasm of breast: Secondary | ICD-10-CM | POA: Diagnosis not present

## 2022-06-24 ENCOUNTER — Ambulatory Visit: Payer: Medicare Other | Admitting: Anesthesiology

## 2022-06-24 ENCOUNTER — Encounter
Admission: RE | Disposition: A | Discharge status: Home / Self Care | Payer: Self-pay | Source: Home / Self Care | Attending: Gastroenterology

## 2022-06-24 ENCOUNTER — Other Ambulatory Visit: Payer: Self-pay

## 2022-06-24 ENCOUNTER — Encounter: Payer: Self-pay | Admitting: *Deleted

## 2022-06-24 ENCOUNTER — Ambulatory Visit
Admission: RE | Admit: 2022-06-24 | Discharge: 2022-06-24 | Disposition: A | Discharge status: Home / Self Care | Payer: Medicare Other | Attending: Gastroenterology | Admitting: Gastroenterology

## 2022-06-24 DIAGNOSIS — I890 Lymphedema, not elsewhere classified: Secondary | ICD-10-CM | POA: Diagnosis not present

## 2022-06-24 DIAGNOSIS — K317 Polyp of stomach and duodenum: Secondary | ICD-10-CM | POA: Insufficient documentation

## 2022-06-24 DIAGNOSIS — K2970 Gastritis, unspecified, without bleeding: Secondary | ICD-10-CM | POA: Diagnosis present

## 2022-06-24 DIAGNOSIS — D132 Benign neoplasm of duodenum: Secondary | ICD-10-CM | POA: Insufficient documentation

## 2022-06-24 DIAGNOSIS — N183 Chronic kidney disease, stage 3 unspecified: Secondary | ICD-10-CM | POA: Diagnosis not present

## 2022-06-24 DIAGNOSIS — K449 Diaphragmatic hernia without obstruction or gangrene: Secondary | ICD-10-CM | POA: Insufficient documentation

## 2022-06-24 DIAGNOSIS — I129 Hypertensive chronic kidney disease with stage 1 through stage 4 chronic kidney disease, or unspecified chronic kidney disease: Secondary | ICD-10-CM | POA: Diagnosis not present

## 2022-06-24 HISTORY — PX: ESOPHAGOGASTRODUODENOSCOPY (EGD) WITH PROPOFOL: SHX5813

## 2022-06-24 SURGERY — ESOPHAGOGASTRODUODENOSCOPY (EGD) WITH PROPOFOL
Anesthesia: General

## 2022-06-24 MED ORDER — LIDOCAINE HCL (CARDIAC) PF 100 MG/5ML IV SOSY
PREFILLED_SYRINGE | INTRAVENOUS | Status: DC | PRN
  Administered 2022-06-24: 80 mg via INTRAVENOUS

## 2022-06-24 MED ORDER — SODIUM CHLORIDE 0.9 % IV SOLN: INTRAVENOUS | Status: DC

## 2022-06-24 MED ORDER — PROPOFOL 10 MG/ML IV BOLUS
INTRAVENOUS | Status: DC | PRN
  Administered 2022-06-24: 50 mg via INTRAVENOUS

## 2022-06-24 MED ORDER — PROPOFOL 500 MG/50ML IV EMUL
INTRAVENOUS | Status: DC | PRN
  Administered 2022-06-24: 100 ug/kg/min via INTRAVENOUS

## 2022-06-24 NOTE — Transfer of Care (Signed)
Immediate Anesthesia Transfer of Care Note  Patient: Jaclyn Galvan  Procedure(s) Performed: ESOPHAGOGASTRODUODENOSCOPY (EGD) WITH PROPOFOL  Patient Location: PACU  Anesthesia Type:General  Level of Consciousness: sedated  Airway & Oxygen Therapy: Patient Spontanous Breathing and Patient connected to nasal cannula oxygen  Post-op Assessment: Report given to RN and Post -op Vital signs reviewed and stable  Post vital signs: Reviewed and stable  Last Vitals:  Vitals Value Taken Time  BP 144/61 06/24/22 1211  Temp 36.2 C 06/24/22 1211  Pulse 68 06/24/22 1211  Resp 20 06/24/22 1211  SpO2 96 % 06/24/22 1211    Last Pain:  Vitals:   06/24/22 1211  TempSrc:   PainSc: Asleep         Complications: No notable events documented.

## 2022-06-24 NOTE — Interval H&P Note (Signed)
History and Physical Interval Note:  06/24/2022 11:50 AM  Jaclyn Galvan  has presented today for surgery, with the diagnosis of Duodenal Adenoma.  The various methods of treatment have been discussed with the patient and family. After consideration of risks, benefits and other options for treatment, the patient has consented to  Procedure(s): ESOPHAGOGASTRODUODENOSCOPY (EGD) WITH PROPOFOL (N/A) as a surgical intervention.  The patient's history has been reviewed, patient examined, no change in status, stable for surgery.  I have reviewed the patient's chart and labs.  Questions were answered to the patient's satisfaction.     Lesly Rubenstein  Ok to proceed with EGD

## 2022-06-24 NOTE — Op Note (Signed)
[facility] Patient Name: Jaclyn Galvan Procedure Date: 06/24/2022 11:36 AM MRN: [id number] Account #: [id number] Date of Birth: [birth year] Admit Type: Outpatient Age: 81 Room: [facility] ENDO ROOM 1 Gender: Female Note Status: Finalized Instrument Name: Upper Endoscope [id number] Procedure:             Upper GI endoscopy Indications:           Follow-up of polyps in the duodenum Providers:             Andrey Farmer MD, MD Referring MD:          Ramonita Lab, MD (Referring MD) Medicines:             Monitored Anesthesia Care Complications:         No immediate complications. Estimated blood loss:                         Minimal. Procedure:             Pre-Anesthesia Assessment:                        - Prior to the procedure, a History and Physical was                         performed, and patient medications and allergies were                         reviewed. The patient is competent. The risks and                         benefits of the procedure and the sedation options and                         risks were discussed with the patient. All questions                         were answered and informed consent was obtained.                         Patient identification and proposed procedure were                         verified by the physician, the nurse, the                         anesthesiologist, the anesthetist and the technician                         in the endoscopy suite. Mental Status Examination:                         alert and oriented. Airway Examination: normal                         oropharyngeal airway and neck mobility. Respiratory                         Examination: clear to auscultation. CV Examination:  normal. Prophylactic Antibiotics: The patient does not                         require prophylactic antibiotics. Prior                         Anticoagulants: The patient has taken no previous                          anticoagulant or antiplatelet agents. ASA Grade                         Assessment: III - A patient with severe systemic                         disease. After reviewing the risks and benefits, the                         patient was deemed in satisfactory condition to                         undergo the procedure. The anesthesia plan was to use                         monitored anesthesia care (MAC). Immediately prior to                         administration of medications, the patient was                         re-assessed for adequacy to receive sedatives. The                         heart rate, respiratory rate, oxygen saturations,                         blood pressure, adequacy of pulmonary ventilation, and                         response to care were monitored throughout the                         procedure. The physical status of the patient was                         re-assessed after the procedure.                        After obtaining informed consent, the endoscope was                         passed under direct vision. Throughout the procedure,                         the patient's blood pressure, pulse, and oxygen                         saturations were monitored continuously. The Endoscope  was introduced through the mouth, and advanced to the                         second part of duodenum. The upper GI endoscopy was                         accomplished without difficulty. The patient tolerated                         the procedure well. Findings:      The examined esophagus was normal.      A 5 cm hiatal hernia was present.      Patchy mild inflammation was found in the gastric antrum.      Lymphangiectasia was present in the second portion of the duodenum.       Given history of duodenal adenoma in this area, it was removed with a       jumbo cold forceps. Resection and retrieval were complete. Estimated       blood loss was  minimal.      The exam of the duodenum was otherwise normal. Impression:            - Normal esophagus.                        - 5 cm hiatal hernia.                        - Gastritis.                        - Duodenal mucosal lymphangiectasia. Recommendation:        - Discharge patient to home.                        - Resume previous diet.                        - Continue present medications.                        - Await pathology results.                        - Return to referring physician as previously                         scheduled. Procedure Code(s):     --- Professional ---                        [phone], Esophagogastroduodenoscopy, flexible,                         transoral; with biopsy, single or multiple Diagnosis Code(s):     --- Professional ---                        K44.9, Diaphragmatic hernia without obstruction or                         gangrene  K29.70, Gastritis, unspecified, without bleeding                        I89.0, Lymphedema, not elsewhere classified                        K31.7, Polyp of stomach and duodenum CPT copyright 2019 [employer]. All rights reserved. The codes documented in this report are preliminary and upon coder review may  be revised to meet current compliance requirements. Andrey Farmer MD, MD 06/24/2022 12:11:09 PM Number of Addenda: 0 Note Initiated On: 06/24/2022 11:36 AM Estimated Blood Loss:  Estimated blood loss was minimal.      [facility] ([place])

## 2022-06-24 NOTE — Anesthesia Postprocedure Evaluation (Signed)
Anesthesia Post Note  Patient: Jaclyn Galvan  Procedure(s) Performed: ESOPHAGOGASTRODUODENOSCOPY (EGD) WITH PROPOFOL  Patient location during evaluation: Endoscopy Anesthesia Type: General Level of consciousness: awake and alert Pain management: pain level controlled Vital Signs Assessment: post-procedure vital signs reviewed and stable Respiratory status: spontaneous breathing, nonlabored ventilation, respiratory function stable and patient connected to nasal cannula oxygen Cardiovascular status: blood pressure returned to baseline and stable Postop Assessment: no apparent nausea or vomiting Anesthetic complications: no   No notable events documented.   Last Vitals:  Vitals:   06/24/22 1211 06/24/22 1221  BP: (!) 144/61 (!) 146/60  Pulse: 68 69  Resp: 20 16  Temp: (!) 36.2 C   SpO2: 96% 96%    Last Pain:  Vitals:   06/24/22 1221  TempSrc:   PainSc: 0-No pain                 Ilene Qua

## 2022-06-24 NOTE — Anesthesia Preprocedure Evaluation (Signed)
Anesthesia Evaluation  Patient identified by MRN, date of birth, ID band Patient awake    Reviewed: Allergy & Precautions, NPO status , Patient's Chart, lab work & pertinent test results  History of Anesthesia Complications Negative for: history of anesthetic complications  Airway Mallampati: II  TM Distance: >3 FB Neck ROM: Full    Dental  (+) Teeth Intact   Pulmonary sleep apnea and Continuous Positive Airway Pressure Ventilation ,    Pulmonary exam normal breath sounds clear to auscultation       Cardiovascular hypertension, Pt. on medications Normal cardiovascular exam+ dysrhythmias Atrial Fibrillation  Rhythm:Regular Rate:Normal  Cardioversion for afib, no repeat episodes, not on medications.    Neuro/Psych Gait problems, uses walker   CVA, No Residual Symptoms negative psych ROS   GI/Hepatic Neg liver ROS, hiatal hernia, GERD  Medicated and Controlled,  Endo/Other  negative endocrine ROS  Renal/GU CRFRenal disease  negative genitourinary   Musculoskeletal  (+) Arthritis , Fibromyalgia -  Abdominal   Peds negative pediatric ROS (+)  Hematology negative hematology ROS (+)   Anesthesia Other Findings   Reproductive/Obstetrics negative OB ROS                             Anesthesia Physical Anesthesia Plan  ASA: 3  Anesthesia Plan: General   Post-op Pain Management:    Induction: Intravenous  PONV Risk Score and Plan: 2 and Propofol infusion and TIVA  Airway Management Planned: Natural Airway and Nasal Cannula  Additional Equipment:   Intra-op Plan:   Post-operative Plan:   Informed Consent: I have reviewed the patients History and Physical, chart, labs and discussed the procedure including the risks, benefits and alternatives for the proposed anesthesia with the patient or authorized representative who has indicated his/her understanding and acceptance.     Dental  Advisory Given  Plan Discussed with: Anesthesiologist, CRNA and Surgeon  Anesthesia Plan Comments: (Patient consented for risks of anesthesia including but not limited to:  - adverse reactions to medications - risk of airway placement if required - damage to eyes, teeth, lips or other oral mucosa - nerve damage due to positioning  - sore throat or hoarseness - Damage to heart, brain, nerves, lungs, other parts of body or loss of life  Patient voiced understanding.)        Anesthesia Quick Evaluation

## 2022-06-24 NOTE — H&P (Signed)
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 No blood thinners. No neck surgeries. No family history of GI malignancies.    Current Facility-Administered Medications:    0.9 %  sodium chloride infusion, , Intravenous, Continuous, Daianna Vasques, [name], MD, Last Rate: 20 mL/hr at 06/24/22 1136, New Bag at 06/24/22 1136  Medications Prior to Admission  Medication Sig Dispense Refill Last Dose   amLODipine (NORVASC) 5 MG tablet Take 5 mg by mouth daily.   06/23/2022   AMLODIPINE-ATORVASTATIN PO Take by mouth.   06/23/2022   ascorbic acid (VITAMIN C) 1000 MG tablet Take 1,000 mg by mouth daily.   06/23/2022   aspirin EC 81 MG tablet Take 81 mg by mouth daily.   06/23/2022   aspirin-acetaminophen-caffeine (EXCEDRIN MIGRAINE) 250-250-65 MG per tablet Take by mouth every 6 (six) hours as needed for headache.   06/23/2022   atorvastatin (LIPITOR) 10 MG tablet Take 5 mg by mouth daily.    06/23/2022   Black Cohosh-SoyIsoflav-Magnol (ESTROVEN MENOPAUSE RELIEF) CAPS Take by mouth at bedtime.    06/23/2022   calcium carbonate 1250 MG capsule Take 1,250 mg by mouth 2 (two) times daily with a meal.   06/23/2022   cholecalciferol (VITAMIN D) 400 UNITS TABS tablet Take 400 Units by mouth.   06/23/2022   donepezil (ARICEPT) 5 MG tablet Take 5 mg by mouth at bedtime.   06/23/2022   DULoxetine (CYMBALTA) 20 MG capsule Take 30 mg by mouth daily.    06/23/2022   losartan (COZAAR) 100 MG tablet Take 100 mg by mouth daily.    06/23/2022   Nutritional Supplements (ESTROVEN PMS PO) Take by mouth.   06/23/2022   sertraline (ZOLOFT) 25 MG tablet Take 25 mg by mouth daily. 2 tablets everay am.   06/23/2022   UNABLE TO FIND    06/23/2022   vitamin B-12 (CYANOCOBALAMIN) 100 MCG tablet Take  100 mcg by mouth daily.   06/23/2022   atorvastatin (LIPITOR) 10 MG tablet Take 10 mg by mouth daily. 0.5 tablets by mouth at night.      co-enzyme Q-10 30 MG capsule Take 30 mg by mouth daily.       Multiple Vitamin (MULTIVITAMIN) tablet Take 1 tablet by mouth daily.      pantoprazole (PROTONIX) 40 MG tablet Take 40 mg by mouth 2 (two) times daily.        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 Brigitte Pulse    Review of systems:  Otherwise negative.    Physical Exam  Gen: Alert, oriented. Appears stated age.  HEENT: PERRLA. Lungs: No respiratory distress CV: RRR Abd: soft, benign, no masses Ext: No edema    Planned procedures: Proceed with EGD. The patient understands the nature of the planned procedure, indications, risks, alternatives and potential complications including but not limited to bleeding, infection, perforation, damage to internal organs and possible oversedation/side effects from anesthesia. The patient agrees and gives consent to proceed.  Please refer to procedure notes for findings,  recommendations and patient disposition/instructions.     Jaclyn Rubenstein, MD [facility]

## 2022-06-25 ENCOUNTER — Encounter: Payer: Self-pay | Admitting: Gastroenterology

## 2022-06-25 LAB — SURGICAL PATHOLOGY

## 2023-05-12 ENCOUNTER — Other Ambulatory Visit: Payer: Self-pay

## 2023-05-12 ENCOUNTER — Emergency Department
Admission: EM | Admit: 2023-05-12 | Discharge: 2023-05-12 | Disposition: A | Discharge status: Home / Self Care | Payer: Medicare Other | Attending: Emergency Medicine | Admitting: Emergency Medicine

## 2023-05-12 ENCOUNTER — Emergency Department: Payer: Medicare Other

## 2023-05-12 DIAGNOSIS — W01198A Fall on same level from slipping, tripping and stumbling with subsequent striking against other object, initial encounter: Secondary | ICD-10-CM | POA: Insufficient documentation

## 2023-05-12 DIAGNOSIS — M545 Low back pain, unspecified: Secondary | ICD-10-CM | POA: Diagnosis not present

## 2023-05-12 DIAGNOSIS — I129 Hypertensive chronic kidney disease with stage 1 through stage 4 chronic kidney disease, or unspecified chronic kidney disease: Secondary | ICD-10-CM | POA: Insufficient documentation

## 2023-05-12 DIAGNOSIS — M542 Cervicalgia: Secondary | ICD-10-CM | POA: Insufficient documentation

## 2023-05-12 DIAGNOSIS — M546 Pain in thoracic spine: Secondary | ICD-10-CM | POA: Diagnosis not present

## 2023-05-12 DIAGNOSIS — N189 Chronic kidney disease, unspecified: Secondary | ICD-10-CM | POA: Insufficient documentation

## 2023-05-12 DIAGNOSIS — I6782 Cerebral ischemia: Secondary | ICD-10-CM | POA: Insufficient documentation

## 2023-05-12 DIAGNOSIS — W19XXXA Unspecified fall, initial encounter: Secondary | ICD-10-CM

## 2023-05-12 LAB — CBC WITH DIFFERENTIAL/PLATELET
Abs Immature Granulocytes: 0.05 10*3/uL (ref 0.00–0.07)
Basophils Absolute: 0.1 10*3/uL (ref 0.0–0.1)
Basophils Relative: 1 %
Eosinophils Absolute: 0.1 10*3/uL (ref 0.0–0.5)
Eosinophils Relative: 1 %
HCT: 41 % (ref 36.0–46.0)
Hemoglobin: 13.2 g/dL (ref 12.0–15.0)
Immature Granulocytes: 0 %
Lymphocytes Relative: 7 %
Lymphs Abs: 0.9 10*3/uL (ref 0.7–4.0)
MCH: 31 pg (ref 26.0–34.0)
MCHC: 32.2 g/dL (ref 30.0–36.0)
MCV: 96.2 fL (ref 80.0–100.0)
Monocytes Absolute: 0.6 10*3/uL (ref 0.1–1.0)
Monocytes Relative: 5 %
Neutro Abs: 10.6 10*3/uL — ABNORMAL HIGH (ref 1.7–7.7)
Neutrophils Relative %: 86 %
Platelets: 232 10*3/uL (ref 150–400)
RBC: 4.26 MIL/uL (ref 3.87–5.11)
RDW: 13.7 % (ref 11.5–15.5)
WBC: 12.3 10*3/uL — ABNORMAL HIGH (ref 4.0–10.5)
nRBC: 0 % (ref 0.0–0.2)

## 2023-05-12 LAB — BASIC METABOLIC PANEL
Anion gap: 10 (ref 5–15)
BUN: 14 mg/dL (ref 8–23)
CO2: 23 mmol/L (ref 22–32)
Calcium: 9.5 mg/dL (ref 8.9–10.3)
Chloride: 104 mmol/L (ref 98–111)
Creatinine, Ser: 1.02 mg/dL — ABNORMAL HIGH (ref 0.44–1.00)
GFR, Estimated: 55 mL/min — ABNORMAL LOW (ref >60)
Glucose, Bld: 115 mg/dL — ABNORMAL HIGH (ref 70–99)
Potassium: 3.9 mmol/L (ref 3.5–5.1)
Sodium: 137 mmol/L (ref 135–145)

## 2023-05-12 LAB — CK: Total CK: 227 U/L (ref 38–234)

## 2023-05-12 MED ORDER — ACETAMINOPHEN 500 MG PO TABS
1000.0000 mg | ORAL_TABLET | Freq: Once | ORAL | Status: AC
  Administered 2023-05-12: 1000 mg via ORAL
  Filled 2023-05-12: qty 2

## 2023-05-12 MED ORDER — LIDOCAINE 5 % EX PTCH
1.0000 | MEDICATED_PATCH | CUTANEOUS | Status: DC
  Administered 2023-05-12: 1 via TRANSDERMAL
  Filled 2023-05-12: qty 1

## 2023-05-12 MED ORDER — LACTATED RINGERS IV BOLUS
1000.0000 mL | Freq: Once | INTRAVENOUS | Status: AC
  Administered 2023-05-12: 1000 mL via INTRAVENOUS

## 2023-05-12 MED ORDER — LIDOCAINE 5 % EX PTCH: 1.0000 | MEDICATED_PATCH | Freq: Two times a day (BID) | CUTANEOUS | 0 refills | Status: AC

## 2023-05-12 NOTE — ED Notes (Signed)
PT was ambulated per DR. Larinda Buttery.  Pt was able to ambulate without difficulty

## 2023-05-12 NOTE — ED Provider Notes (Signed)
[facility] Provider Note    Event Date/Time   First MD Initiated Contact with Patient 05/12/23 1923     (approximate)   History   Chief Complaint Fall   HPI  Jaclyn Galvan is a 82 y.o. female with a past medical history of hypertension, hyperlipidemia, atrial fibrillation, CKD, anemia, and fibromyalgia who presents to the ED following fall.  Patient reports that she lost her balance earlier this morning and got tripped up where part of her wall sticks out at the assisted living facility.  She fell to the ground and struck her upper back as well as the lower part of her neck, denies hitting her head or losing consciousness.  She does state that she had a hard time getting up off of the ground due to the pain in her back after the fall.  She denies any pain in her extremities, chest, or abdomen.  She does not take any blood thinners.  She spent about 9 hours on the floor before being found by her daughter.     Physical Exam   Triage Vital Signs: ED Triage Vitals [05/12/23 1940]  Encounter Vitals Group     BP (!) 175/52     Systolic BP Percentile      Diastolic BP Percentile      Pulse Rate 82     Resp 18     Temp 97.9 F (36.6 C)     Temp Source Oral     SpO2 98 %     Weight      Height      Head Circumference      Peak Flow      Pain Score 7     Pain Loc      Pain Education      Exclude from Growth Chart     Most recent vital signs: Vitals:   05/12/23 2000 05/12/23 2100  BP: (!) 180/66 (!) 171/76  Pulse: 79 83  Resp: 18 17  Temp:    SpO2: 97% 97%    Constitutional: Alert and oriented. Eyes: Conjunctivae are normal. Head: Atraumatic. Nose: No congestion/rhinnorhea. Mouth/Throat: Mucous membranes are moist.  Neck: Midline cervical spine tenderness to palpation noted. Cardiovascular: Normal rate, regular rhythm. Grossly normal heart sounds.  2+ radial pulses bilaterally. Respiratory: Normal respiratory effort.  No retractions.  Lungs CTAB.  No chest wall tenderness to palpation. Gastrointestinal: Soft and nontender. No distention. Musculoskeletal: No lower extremity tenderness nor edema.  No upper extremity bony tenderness to palpation.  Midline thoracic and lumbar spinal tenderness to palpation noted. Neurologic:  Normal speech and language. No gross focal neurologic deficits are appreciated.    ED Results / Procedures / Treatments   Labs (all labs ordered are listed, but only abnormal results are displayed) Labs Reviewed  CBC WITH DIFFERENTIAL/PLATELET - Abnormal; Notable for the following components:      Result Value   WBC 12.3 (*)    Neutro Abs 10.6 (*)    All other components within normal limits  BASIC METABOLIC PANEL - Abnormal; Notable for the following components:   Glucose, Bld 115 (*)    Creatinine, Ser 1.02 (*)    GFR, Estimated 55 (*)    All other components within normal limits  CK     EKG  ED ECG REPORT I, Chesley Noon, the attending physician, personally viewed and interpreted this ECG.   Date: 05/12/2023  EKG Time: 19:51  Rate: 84  Rhythm: normal sinus  rhythm  Axis: Normal  Intervals:none  ST&T Change: None  RADIOLOGY CT head reviewed and interpreted by me with no hemorrhage or midline shift.  PROCEDURES:  Critical Care performed: No  Procedures   MEDICATIONS ORDERED IN ED: Medications  lidocaine (LIDODERM) 5 % 1 patch (1 patch Transdermal Patch Applied 05/12/23 2002)  lactated ringers bolus 1,000 mL (1,000 mLs Intravenous New Bag/Given 05/12/23 2007)  acetaminophen (TYLENOL) tablet 1,000 mg (1,000 mg Oral Given 05/12/23 2002)     IMPRESSION / MDM / ASSESSMENT AND PLAN / ED COURSE  I reviewed the triage vital signs and the nursing notes.                              82 y.o. female with past medical history of hypertension, hyperlipidemia, atrial fibrillation, CKD, anemia, and fibromyalgia who presents to the ED following a slip and fall earlier this morning,  subsequently spent 9 hours on the floor due to back pain and difficulty standing up.  Patient's presentation is most consistent with acute presentation with potential threat to life or bodily function.  Differential diagnosis includes, but is not limited to, intracranial injury, cervical spine injury, other spinal injury, rhabdomyolysis, anemia, AKI, electrolyte abnormality.  Patient nontoxic-appearing and in no acute distress, vital signs are unremarkable.  EKG shows no evidence of arrhythmia or ischemia, fall appears to have been mechanical.  We will screen labs, also check CT imaging of her head, cervical spine, thoracic spine, and lumbar spine.  No evidence of injury to her chest, abdomen, or extremities.  Patient requesting Tylenol for pain, will also place Lidoderm patch.  CT head, cervical spine, thoracic spine, and lumbar spine are all negative for acute traumatic injury.  Patient does have mild groundglass opacities in the lungs, but she has not had any vomiting or other symptoms concerning for aspiration and I doubt acute pneumonia given no cough or difficulty breathing.  She does report frequent coughing a couple of weeks ago for which she was prescribed antibiotics, may have some residual scarring from this.  Back pain improved following Lidoderm patch and Tylenol, patient able to ambulate without difficulty here in the ED.  She is appropriate for discharge home with PCP follow-up, was counseled to return to the ED for new or worsening symptoms.  Daughter agrees with plan.      FINAL CLINICAL IMPRESSION(S) / ED DIAGNOSES   Final diagnoses:  Fall, initial encounter  Acute midline low back pain without sciatica     Rx / DC Orders   ED Discharge Orders          Ordered    lidocaine (LIDODERM) 5 %  Every 12 hours        05/12/23 2125             Note:  This document was prepared using Dragon voice recognition software and may include unintentional dictation errors.    Chesley Noon, MD 05/12/23 2127

## 2023-05-12 NOTE — ED Triage Notes (Signed)
Pt BIB EMS for fall--pt and EMS state that pt was laying on the floor for hours until daughter found pt. Pt coming from [facility]

## 2023-05-28 ENCOUNTER — Encounter: Payer: Self-pay | Admitting: Emergency Medicine

## 2023-05-28 ENCOUNTER — Other Ambulatory Visit: Payer: Self-pay

## 2023-05-28 ENCOUNTER — Emergency Department
Admission: EM | Admit: 2023-05-28 | Discharge: 2023-05-28 | Disposition: A | Discharge status: Home / Self Care | Payer: Medicare Other | Attending: Emergency Medicine | Admitting: Emergency Medicine

## 2023-05-28 ENCOUNTER — Emergency Department: Payer: Medicare Other

## 2023-05-28 DIAGNOSIS — I6789 Other cerebrovascular disease: Secondary | ICD-10-CM | POA: Insufficient documentation

## 2023-05-28 DIAGNOSIS — M545 Low back pain, unspecified: Secondary | ICD-10-CM | POA: Diagnosis present

## 2023-05-28 DIAGNOSIS — R109 Unspecified abdominal pain: Secondary | ICD-10-CM

## 2023-05-28 DIAGNOSIS — R748 Abnormal levels of other serum enzymes: Secondary | ICD-10-CM | POA: Insufficient documentation

## 2023-05-28 DIAGNOSIS — W010XXA Fall on same level from slipping, tripping and stumbling without subsequent striking against object, initial encounter: Secondary | ICD-10-CM | POA: Diagnosis not present

## 2023-05-28 DIAGNOSIS — R0789 Other chest pain: Secondary | ICD-10-CM | POA: Insufficient documentation

## 2023-05-28 DIAGNOSIS — M25551 Pain in right hip: Secondary | ICD-10-CM | POA: Diagnosis not present

## 2023-05-28 DIAGNOSIS — R1031 Right lower quadrant pain: Secondary | ICD-10-CM | POA: Diagnosis not present

## 2023-05-28 DIAGNOSIS — M2578 Osteophyte, vertebrae: Secondary | ICD-10-CM | POA: Diagnosis not present

## 2023-05-28 DIAGNOSIS — Y92009 Unspecified place in unspecified non-institutional (private) residence as the place of occurrence of the external cause: Secondary | ICD-10-CM

## 2023-05-28 DIAGNOSIS — I10 Essential (primary) hypertension: Secondary | ICD-10-CM | POA: Insufficient documentation

## 2023-05-28 LAB — CBC WITH DIFFERENTIAL/PLATELET
Abs Immature Granulocytes: 0.04 10*3/uL (ref 0.00–0.07)
Basophils Absolute: 0.1 10*3/uL (ref 0.0–0.1)
Basophils Relative: 1 %
Eosinophils Absolute: 0.1 10*3/uL (ref 0.0–0.5)
Eosinophils Relative: 1 %
HCT: 40.1 % (ref 36.0–46.0)
Hemoglobin: 13.1 g/dL (ref 12.0–15.0)
Immature Granulocytes: 0 %
Lymphocytes Relative: 9 %
Lymphs Abs: 1 10*3/uL (ref 0.7–4.0)
MCH: 30.8 pg (ref 26.0–34.0)
MCHC: 32.7 g/dL (ref 30.0–36.0)
MCV: 94.1 fL (ref 80.0–100.0)
Monocytes Absolute: 0.6 10*3/uL (ref 0.1–1.0)
Monocytes Relative: 6 %
Neutro Abs: 9.3 10*3/uL — ABNORMAL HIGH (ref 1.7–7.7)
Neutrophils Relative %: 83 %
Platelets: 262 10*3/uL (ref 150–400)
RBC: 4.26 MIL/uL (ref 3.87–5.11)
RDW: 13.9 % (ref 11.5–15.5)
WBC: 11.1 10*3/uL — ABNORMAL HIGH (ref 4.0–10.5)
nRBC: 0 % (ref 0.0–0.2)

## 2023-05-28 LAB — COMPREHENSIVE METABOLIC PANEL
ALT: 16 U/L (ref 0–44)
AST: 29 U/L (ref 15–41)
Albumin: 4.3 g/dL (ref 3.5–5.0)
Alkaline Phosphatase: 79 U/L (ref 38–126)
Anion gap: 9 (ref 5–15)
BUN: 14 mg/dL (ref 8–23)
CO2: 25 mmol/L (ref 22–32)
Calcium: 9.5 mg/dL (ref 8.9–10.3)
Chloride: 103 mmol/L (ref 98–111)
Creatinine, Ser: 0.86 mg/dL (ref 0.44–1.00)
GFR, Estimated: >60 mL/min (ref >60)
Glucose, Bld: 130 mg/dL — ABNORMAL HIGH (ref 70–99)
Potassium: 3.6 mmol/L (ref 3.5–5.1)
Sodium: 137 mmol/L (ref 135–145)
Total Bilirubin: 1.9 mg/dL — ABNORMAL HIGH (ref 0.3–1.2)
Total Protein: 7.8 g/dL (ref 6.5–8.1)

## 2023-05-28 LAB — CK: Total CK: 395 U/L — ABNORMAL HIGH (ref 38–234)

## 2023-05-28 MED ORDER — SODIUM CHLORIDE 0.9 % IV BOLUS
1000.0000 mL | Freq: Once | INTRAVENOUS | Status: AC
  Administered 2023-05-28: 1000 mL via INTRAVENOUS

## 2023-05-28 MED ORDER — IOHEXOL 300 MG/ML  SOLN
100.0000 mL | Freq: Once | INTRAMUSCULAR | Status: AC | PRN
  Administered 2023-05-28: 100 mL via INTRAVENOUS

## 2023-05-28 MED ORDER — HYDROMORPHONE HCL 1 MG/ML IJ SOLN
0.5000 mg | Freq: Once | INTRAMUSCULAR | Status: AC
  Administered 2023-05-28: 0.5 mg via INTRAVENOUS
  Filled 2023-05-28: qty 0.5

## 2023-05-28 NOTE — ED Provider Notes (Signed)
[facility] Provider Note    Event Date/Time   First MD Initiated Contact with Patient 05/28/23 [phone]     (approximate)   History   Fall   HPI  Jaclyn Galvan is a 82 y.o. female with history of A-fib, hypertension presenting to the emergency department for evaluation after fall.  On Tuesday night she was walking around when she slipped and fell onto her right side.  Does not think she hit her head.  No loss of consciousness.  Unfortunately she had pain over her hip and lower back and she was unable to stand up.  Earlier today, patient's daughter called and when she did not answer came to check on her and found her on the floor.  She had not had anything to eat or drink during this time.  She denies headache, dizziness, loss of consciousness, chest pain, shortness of breath.      Physical Exam   Triage Vital Signs: ED Triage Vitals  Encounter Vitals Group     BP 05/28/23 0103 (!) (P) 180/70     Systolic BP Percentile --      Diastolic BP Percentile --      Pulse Rate 05/28/23 0103 (P) 84     Resp 05/28/23 0103 (P) 18     Temp 05/28/23 0103 (P) 97.9 F (36.6 C)     Temp Source 05/28/23 0103 (P) Oral     SpO2 05/28/23 0103 (P) 98 %     Weight 05/28/23 0100 200 lb (90.7 kg)     Height 05/28/23 0100 5\' 6"  (1.676 m)     Head Circumference --      Peak Flow --      Pain Score 05/28/23 0059 7     Pain Loc --      Pain Education --      Exclude from Growth Chart --     Most recent vital signs: Vitals:   05/28/23 0350 05/28/23 0503  BP: (!) 157/62   Pulse: 78   Resp: 17   Temp:  97.9 F (36.6 C)  SpO2: 94%     Nursing notes and vital signs reviewed.  General: Adult female, laying in bed, awake and interactive Head: Atraumatic Chest: Symmetric chest rise, no tenderness to palpation.  Cardiac: Regular rhythm and rate.  Respiratory: Lungs clear to auscultation.  Tenderness to palpation over the right lateral chest wall. Abdomen: Soft,  nondistended.  Tenderness palpation of the right lower quadrant, remainder of abdomen nontender.   Pelvis: Stable in AP and lateral compression.  No tenderness over the right hip.  MSK: No deformity to bilateral upper and lower extremity.  Able to range bilateral upper and lower extremities, but does report pain in the right hip with attempts at range of motion.  No focal tenderness over the remainder of her joints.   Neuro: Alert, oriented. GCS 15.Normal sensation to light touch in bilateral upper and lower extremity. Skin: No evidence of burns or lacerations.   ED Results / Procedures / Treatments   Labs (all labs ordered are listed, but only abnormal results are displayed) Labs Reviewed  CBC WITH DIFFERENTIAL/PLATELET - Abnormal; Notable for the following components:      Result Value   WBC 11.1 (*)    Neutro Abs 9.3 (*)    All other components within normal limits  COMPREHENSIVE METABOLIC PANEL - Abnormal; Notable for the following components:   Glucose, Bld 130 (*)    Total Bilirubin  1.9 (*)    All other components within normal limits  CK - Abnormal; Notable for the following components:   Total CK 395 (*)    All other components within normal limits     EKG EKG independently reviewed interpreted by myself (ER attending) demonstrates:  EKG demonstrate sinus rhythm at a rate of 79, PR 178, QRS 102, QTc 457, PVC noted, no acute ST changes  RADIOLOGY Imaging independently reviewed and interpreted by myself demonstrates:  CT head without acute bleed CT C, T, L-spine without acute fracture CT chest abdomen pelvis without laceration or fracture PROCEDURES:  Critical Care performed: No  Procedures   MEDICATIONS ORDERED IN ED: Medications  sodium chloride 0.9 % bolus 1,000 mL (0 mLs Intravenous Stopped 05/28/23 0502)  HYDROmorphone (DILAUDID) injection 0.5 mg (0.5 mg Intravenous Given 05/28/23 0141)  iohexol (OMNIPAQUE) 300 MG/ML solution 100 mL (100 mLs Intravenous Contrast  Given 05/28/23 0223)     IMPRESSION / MDM / ASSESSMENT AND PLAN / ED COURSE  I reviewed the triage vital signs and the nursing notes.  Differential diagnosis includes, but is not limited to, intracranial bleed, skull fracture, spine fracture, rib fracture, pneumothorax, hemothorax, intra-abdominal injury, hip fracture or dislocation, rhabdomyolysis, anemia, electrolyte abnormality  Patient's presentation is most consistent with acute presentation with potential threat to life or bodily function.  82 year old female presenting to the emergency department for evaluation after a fall with prolonged downtime.  Hypertensive on presentation, but in the setting of not having access to her blood pressure medication.  Will obtain labs including CK as well as CTs to evaluate for traumatic injuries.  Dilaudid ordered for pain control as well as IV fluids.  Labs with slightly elevated CK at 395, but not consistent with rhabdomyolysis.  Given IV fluids.  Labs otherwise without severe derangement including normal creatinine.  CT imaging fortunately without evidence of acute traumatic injury.  On reevaluation, patient does report feeling improved.  She was able to ambulate with a walker which she uses at baseline with steady gait.  Daughter presented to bedside.  Results reviewed with patient and family.  They are comfortable with discharge home.  Patient is an independent living, but does have help available as needed and family will plan to check on her more regularly.  Strict return precautions provided.  Patient discharged stable condition.     FINAL CLINICAL IMPRESSION(S) / ED DIAGNOSES   Final diagnoses:  Right sided abdominal pain  Pain of right hip  Fall in home, initial encounter     Rx / DC Orders   ED Discharge Orders     None        Note:  This document was prepared using Dragon voice recognition software and may include unintentional dictation errors.   Trinna Post, MD 05/28/23  [phone]

## 2023-05-28 NOTE — ED Triage Notes (Signed)
Pt presents via [facility] from [facility] following a fall on Tuesday night around midnight. She notes losing her balance, slipping and landed on her buttocks. Endorses pain to the R hip, R knee, and lower back. A&Ox4 at this time. Denies LOC, vomiting, dizziness, vision changes, CP or SOB.

## 2023-05-28 NOTE — Discharge Instructions (Signed)
You were seen in the ER today for evaluation after a fall.  Fortunately we did not find any serious injuries.  Please follow with your primary care doctor within a few days for reevaluation.  Return to the ER for new or worsening symptoms.

## 2024-06-04 ENCOUNTER — Emergency Department

## 2024-06-04 ENCOUNTER — Other Ambulatory Visit: Payer: Self-pay

## 2024-06-04 ENCOUNTER — Inpatient Hospital Stay
Admission: EM | Admit: 2024-06-04 | Discharge: 2024-06-09 | DRG: 565 | Disposition: A | Discharge status: Skilled Nursing Facility | Attending: Internal Medicine | Admitting: Internal Medicine

## 2024-06-04 DIAGNOSIS — T796XXA Traumatic ischemia of muscle, initial encounter: Principal | ICD-10-CM | POA: Diagnosis present

## 2024-06-04 DIAGNOSIS — E66811 Obesity, class 1: Secondary | ICD-10-CM | POA: Diagnosis present

## 2024-06-04 DIAGNOSIS — M6282 Rhabdomyolysis: Secondary | ICD-10-CM | POA: Diagnosis present

## 2024-06-04 DIAGNOSIS — R0902 Hypoxemia: Secondary | ICD-10-CM | POA: Diagnosis not present

## 2024-06-04 DIAGNOSIS — Z9181 History of falling: Secondary | ICD-10-CM

## 2024-06-04 DIAGNOSIS — M8580 Other specified disorders of bone density and structure, unspecified site: Secondary | ICD-10-CM | POA: Diagnosis present

## 2024-06-04 DIAGNOSIS — D72829 Elevated white blood cell count, unspecified: Secondary | ICD-10-CM | POA: Diagnosis not present

## 2024-06-04 DIAGNOSIS — Z96653 Presence of artificial knee joint, bilateral: Secondary | ICD-10-CM | POA: Diagnosis present

## 2024-06-04 DIAGNOSIS — R410 Disorientation, unspecified: Secondary | ICD-10-CM | POA: Diagnosis present

## 2024-06-04 DIAGNOSIS — I480 Paroxysmal atrial fibrillation: Secondary | ICD-10-CM | POA: Diagnosis not present

## 2024-06-04 DIAGNOSIS — Z6832 Body mass index (BMI) 32.0-32.9, adult: Secondary | ICD-10-CM

## 2024-06-04 DIAGNOSIS — W010XXA Fall on same level from slipping, tripping and stumbling without subsequent striking against object, initial encounter: Secondary | ICD-10-CM | POA: Diagnosis present

## 2024-06-04 DIAGNOSIS — D72828 Other elevated white blood cell count: Secondary | ICD-10-CM | POA: Diagnosis present

## 2024-06-04 DIAGNOSIS — Y9301 Activity, walking, marching and hiking: Secondary | ICD-10-CM | POA: Diagnosis present

## 2024-06-04 DIAGNOSIS — Z515 Encounter for palliative care: Secondary | ICD-10-CM

## 2024-06-04 DIAGNOSIS — N1831 Chronic kidney disease, stage 3a: Secondary | ICD-10-CM | POA: Diagnosis present

## 2024-06-04 DIAGNOSIS — R531 Weakness: Secondary | ICD-10-CM | POA: Diagnosis present

## 2024-06-04 DIAGNOSIS — W19XXXA Unspecified fall, initial encounter: Principal | ICD-10-CM

## 2024-06-04 DIAGNOSIS — M25511 Pain in right shoulder: Secondary | ICD-10-CM | POA: Diagnosis present

## 2024-06-04 DIAGNOSIS — Z7982 Long term (current) use of aspirin: Secondary | ICD-10-CM

## 2024-06-04 DIAGNOSIS — F411 Generalized anxiety disorder: Secondary | ICD-10-CM | POA: Diagnosis present

## 2024-06-04 DIAGNOSIS — G4733 Obstructive sleep apnea (adult) (pediatric): Secondary | ICD-10-CM | POA: Diagnosis present

## 2024-06-04 DIAGNOSIS — F0392 Unspecified dementia, unspecified severity, with psychotic disturbance: Secondary | ICD-10-CM | POA: Diagnosis present

## 2024-06-04 DIAGNOSIS — F0393 Unspecified dementia, unspecified severity, with mood disturbance: Secondary | ICD-10-CM | POA: Diagnosis present

## 2024-06-04 DIAGNOSIS — I10 Essential (primary) hypertension: Secondary | ICD-10-CM | POA: Diagnosis not present

## 2024-06-04 DIAGNOSIS — K219 Gastro-esophageal reflux disease without esophagitis: Secondary | ICD-10-CM | POA: Diagnosis present

## 2024-06-04 DIAGNOSIS — I459 Conduction disorder, unspecified: Secondary | ICD-10-CM | POA: Diagnosis present

## 2024-06-04 DIAGNOSIS — F32A Depression, unspecified: Secondary | ICD-10-CM | POA: Diagnosis present

## 2024-06-04 DIAGNOSIS — Z885 Allergy status to narcotic agent status: Secondary | ICD-10-CM

## 2024-06-04 DIAGNOSIS — Z66 Do not resuscitate: Secondary | ICD-10-CM | POA: Diagnosis present

## 2024-06-04 DIAGNOSIS — Z803 Family history of malignant neoplasm of breast: Secondary | ICD-10-CM

## 2024-06-04 DIAGNOSIS — M25512 Pain in left shoulder: Secondary | ICD-10-CM | POA: Diagnosis present

## 2024-06-04 DIAGNOSIS — I129 Hypertensive chronic kidney disease with stage 1 through stage 4 chronic kidney disease, or unspecified chronic kidney disease: Secondary | ICD-10-CM | POA: Diagnosis present

## 2024-06-04 DIAGNOSIS — Z9049 Acquired absence of other specified parts of digestive tract: Secondary | ICD-10-CM

## 2024-06-04 DIAGNOSIS — M25562 Pain in left knee: Secondary | ICD-10-CM | POA: Diagnosis present

## 2024-06-04 DIAGNOSIS — Z8673 Personal history of transient ischemic attack (TIA), and cerebral infarction without residual deficits: Secondary | ICD-10-CM

## 2024-06-04 DIAGNOSIS — M797 Fibromyalgia: Secondary | ICD-10-CM | POA: Diagnosis present

## 2024-06-04 DIAGNOSIS — D631 Anemia in chronic kidney disease: Secondary | ICD-10-CM | POA: Diagnosis present

## 2024-06-04 DIAGNOSIS — T503X5A Adverse effect of electrolytic, caloric and water-balance agents, initial encounter: Secondary | ICD-10-CM | POA: Diagnosis not present

## 2024-06-04 DIAGNOSIS — I493 Ventricular premature depolarization: Secondary | ICD-10-CM | POA: Diagnosis present

## 2024-06-04 DIAGNOSIS — Z79899 Other long term (current) drug therapy: Secondary | ICD-10-CM

## 2024-06-04 DIAGNOSIS — E538 Deficiency of other specified B group vitamins: Secondary | ICD-10-CM | POA: Diagnosis present

## 2024-06-04 DIAGNOSIS — M542 Cervicalgia: Secondary | ICD-10-CM | POA: Diagnosis present

## 2024-06-04 DIAGNOSIS — Y92031 Bathroom in apartment as the place of occurrence of the external cause: Secondary | ICD-10-CM

## 2024-06-04 DIAGNOSIS — F0390 Unspecified dementia without behavioral disturbance: Secondary | ICD-10-CM | POA: Insufficient documentation

## 2024-06-04 DIAGNOSIS — M25561 Pain in right knee: Secondary | ICD-10-CM | POA: Diagnosis present

## 2024-06-04 DIAGNOSIS — Z860101 Personal history of adenomatous and serrated colon polyps: Secondary | ICD-10-CM

## 2024-06-04 DIAGNOSIS — Z881 Allergy status to other antibiotic agents status: Secondary | ICD-10-CM

## 2024-06-04 DIAGNOSIS — M545 Low back pain, unspecified: Secondary | ICD-10-CM | POA: Diagnosis present

## 2024-06-04 DIAGNOSIS — E785 Hyperlipidemia, unspecified: Secondary | ICD-10-CM

## 2024-06-04 DIAGNOSIS — E7800 Pure hypercholesterolemia, unspecified: Secondary | ICD-10-CM | POA: Diagnosis present

## 2024-06-04 DIAGNOSIS — E8779 Other fluid overload: Secondary | ICD-10-CM | POA: Diagnosis not present

## 2024-06-04 LAB — CBC WITH DIFFERENTIAL/PLATELET
Abs Immature Granulocytes: 0.11 K/uL — ABNORMAL HIGH (ref 0.00–0.07)
Basophils Absolute: 0 K/uL (ref 0.0–0.1)
Basophils Relative: 0 %
Eosinophils Absolute: 0 K/uL (ref 0.0–0.5)
Eosinophils Relative: 0 %
HCT: 35.1 % — ABNORMAL LOW (ref 36.0–46.0)
Hemoglobin: 11 g/dL — ABNORMAL LOW (ref 12.0–15.0)
Immature Granulocytes: 1 %
Lymphocytes Relative: 3 %
Lymphs Abs: 0.5 K/uL — ABNORMAL LOW (ref 0.7–4.0)
MCH: 27.7 pg (ref 26.0–34.0)
MCHC: 31.3 g/dL (ref 30.0–36.0)
MCV: 88.4 fL (ref 80.0–100.0)
Monocytes Absolute: 0.8 K/uL (ref 0.1–1.0)
Monocytes Relative: 5 %
Neutro Abs: 15.6 K/uL — ABNORMAL HIGH (ref 1.7–7.7)
Neutrophils Relative %: 91 %
Platelets: 370 K/uL (ref 150–400)
RBC: 3.97 MIL/uL (ref 3.87–5.11)
RDW: 14.6 % (ref 11.5–15.5)
WBC: 17.1 K/uL — ABNORMAL HIGH (ref 4.0–10.5)
nRBC: 0 % (ref 0.0–0.2)

## 2024-06-04 LAB — COMPREHENSIVE METABOLIC PANEL WITH GFR
ALT: 14 U/L (ref 0–44)
AST: 36 U/L (ref 15–41)
Albumin: 4 g/dL (ref 3.5–5.0)
Alkaline Phosphatase: 68 U/L (ref 38–126)
Anion gap: 13 (ref 5–15)
BUN: 28 mg/dL — ABNORMAL HIGH (ref 8–23)
CO2: 22 mmol/L (ref 22–32)
Calcium: 9.1 mg/dL (ref 8.9–10.3)
Chloride: 103 mmol/L (ref 98–111)
Creatinine, Ser: 0.92 mg/dL (ref 0.44–1.00)
GFR, Estimated: >60 mL/min (ref >60)
Glucose, Bld: 132 mg/dL — ABNORMAL HIGH (ref 70–99)
Potassium: 3.8 mmol/L (ref 3.5–5.1)
Sodium: 138 mmol/L (ref 135–145)
Total Bilirubin: 1.8 mg/dL — ABNORMAL HIGH (ref 0.0–1.2)
Total Protein: 7.8 g/dL (ref 6.5–8.1)

## 2024-06-04 LAB — CK: Total CK: 1099 U/L — ABNORMAL HIGH (ref 38–234)

## 2024-06-04 MED ORDER — ONDANSETRON HCL 4 MG PO TABS: 4.0000 mg | ORAL_TABLET | Freq: Four times a day (QID) | ORAL | Status: DC | PRN

## 2024-06-04 MED ORDER — DULOXETINE HCL 30 MG PO CPEP
30.0000 mg | ORAL_CAPSULE | Freq: Every day | ORAL | Status: DC
  Administered 2024-06-05 – 2024-06-09 (×5): 30 mg via ORAL
  Filled 2024-06-04 (×5): qty 1

## 2024-06-04 MED ORDER — PANTOPRAZOLE SODIUM 20 MG PO TBEC
20.0000 mg | DELAYED_RELEASE_TABLET | Freq: Every day | ORAL | Status: DC
  Filled 2024-06-04 (×2): qty 1

## 2024-06-04 MED ORDER — ACETAMINOPHEN 650 MG RE SUPP: 650.0000 mg | Freq: Four times a day (QID) | RECTAL | Status: DC | PRN

## 2024-06-04 MED ORDER — VITAMIN D 25 MCG (1000 UNIT) PO TABS
500.0000 [IU] | ORAL_TABLET | Freq: Every day | ORAL | Status: DC
  Administered 2024-06-05 – 2024-06-09 (×5): 500 [IU] via ORAL
  Filled 2024-06-04 (×5): qty 1

## 2024-06-04 MED ORDER — AMLODIPINE-ATORVASTATIN 5-10 MG PO TABS: 1.0000 | ORAL_TABLET | Freq: Every day | ORAL | Status: DC

## 2024-06-04 MED ORDER — MORPHINE SULFATE (PF) 2 MG/ML IV SOLN
2.0000 mg | Freq: Once | INTRAVENOUS | Status: AC
  Administered 2024-06-04: 2 mg via INTRAVENOUS
  Filled 2024-06-04: qty 1

## 2024-06-04 MED ORDER — CALCIUM CARBONATE 1250 (500 CA) MG PO TABS
1250.0000 mg | ORAL_TABLET | Freq: Two times a day (BID) | ORAL | Status: DC
  Administered 2024-06-05 – 2024-06-09 (×9): 1250 mg via ORAL
  Filled 2024-06-04 (×9): qty 1

## 2024-06-04 MED ORDER — VITAMIN C 500 MG PO TABS
1000.0000 mg | ORAL_TABLET | Freq: Every day | ORAL | Status: DC
  Administered 2024-06-05 – 2024-06-09 (×5): 1000 mg via ORAL
  Filled 2024-06-04 (×5): qty 2

## 2024-06-04 MED ORDER — ADULT MULTIVITAMIN W/MINERALS CH
1.0000 | ORAL_TABLET | Freq: Every day | ORAL | Status: DC
  Administered 2024-06-05 – 2024-06-09 (×5): 1 via ORAL
  Filled 2024-06-04 (×5): qty 1

## 2024-06-04 MED ORDER — ASPIRIN 81 MG PO TBEC
81.0000 mg | DELAYED_RELEASE_TABLET | Freq: Every day | ORAL | Status: DC
  Administered 2024-06-05 – 2024-06-06 (×2): 81 mg via ORAL
  Filled 2024-06-04 (×2): qty 1

## 2024-06-04 MED ORDER — SERTRALINE HCL 50 MG PO TABS: 25.0000 mg | ORAL_TABLET | Freq: Every day | ORAL | Status: DC

## 2024-06-04 MED ORDER — ONDANSETRON HCL 4 MG/2ML IJ SOLN
4.0000 mg | Freq: Once | INTRAMUSCULAR | Status: AC
  Administered 2024-06-04: 4 mg via INTRAVENOUS
  Filled 2024-06-04: qty 2

## 2024-06-04 MED ORDER — VITAMIN B-12 100 MCG PO TABS
100.0000 ug | ORAL_TABLET | Freq: Every day | ORAL | Status: DC
  Administered 2024-06-05 – 2024-06-09 (×5): 100 ug via ORAL
  Filled 2024-06-04 (×5): qty 1

## 2024-06-04 MED ORDER — SODIUM CHLORIDE 0.9 % IV SOLN: INTRAVENOUS | Status: DC

## 2024-06-04 MED ORDER — DONEPEZIL HCL 5 MG PO TABS: 5.0000 mg | ORAL_TABLET | Freq: Every day | ORAL | Status: DC

## 2024-06-04 MED ORDER — TRAZODONE HCL 50 MG PO TABS
25.0000 mg | ORAL_TABLET | Freq: Every evening | ORAL | Status: DC | PRN
  Administered 2024-06-06: 25 mg via ORAL
  Filled 2024-06-04 (×2): qty 1

## 2024-06-04 MED ORDER — ONDANSETRON HCL 4 MG/2ML IJ SOLN: 4.0000 mg | Freq: Four times a day (QID) | INTRAMUSCULAR | Status: DC | PRN

## 2024-06-04 MED ORDER — ACETAMINOPHEN 325 MG PO TABS
650.0000 mg | ORAL_TABLET | Freq: Four times a day (QID) | ORAL | Status: DC | PRN
  Administered 2024-06-05 – 2024-06-08 (×3): 650 mg via ORAL
  Filled 2024-06-04 (×3): qty 2

## 2024-06-04 MED ORDER — MAGNESIUM HYDROXIDE 400 MG/5ML PO SUSP: 30.0000 mL | Freq: Every day | ORAL | Status: DC | PRN

## 2024-06-04 MED ORDER — ENOXAPARIN SODIUM 60 MG/0.6ML IJ SOSY
0.5000 mg/kg | PREFILLED_SYRINGE | Freq: Every day | INTRAMUSCULAR | Status: DC
  Administered 2024-06-05 – 2024-06-06 (×3): 45 mg via SUBCUTANEOUS
  Filled 2024-06-04 (×3): qty 0.6

## 2024-06-04 MED ORDER — LOSARTAN POTASSIUM 50 MG PO TABS
100.0000 mg | ORAL_TABLET | Freq: Every day | ORAL | Status: DC
  Administered 2024-06-05: 100 mg via ORAL
  Filled 2024-06-04: qty 2

## 2024-06-04 MED ORDER — LACTATED RINGERS IV BOLUS
1000.0000 mL | Freq: Once | INTRAVENOUS | Status: AC
  Administered 2024-06-04: 1000 mL via INTRAVENOUS

## 2024-06-04 NOTE — H&P (Signed)
 [place]   PATIENT NAME: Jaclyn Galvan    MR#:  [id number]  DATE OF BIRTH:  27-May-1941  DATE OF ADMISSION:  06/04/2024  PRIMARY CARE PHYSICIAN: [name], MD   Patient is coming from: [facility]   REQUESTING/REFERRING PHYSICIAN: Willo Dunnings, MD  CHIEF COMPLAINT:   Chief Complaint  Patient presents with   Fall    HISTORY OF PRESENT ILLNESS:  Jaclyn Galvan is a 83 y.o. Caucasian female with medical history significant for stage III chronic kidney disease, fibromyalgia, hypertension, paroxysmal atrial fibrillation, dyslipidemia, GAD, OSA and CVA, who presented to the ER with acute onset of fall.  The patient was leaving her bathroom on her way to bed at 3 AM this morning when she tripped on the doorway and fell.  She denies any headache or dizziness or blurred vision.  No presyncope or syncope.  She denies any head injuries.  She had a very hard time getting off of the ground.  She was eventually found by her staff at her independent living facility this evening.  She was having back pain and bilateral knee pain.  Per her family she seemed more confused than her usual.  She also reported visual hallucinations.  She denied any fever or chills.  No cough or wheezing or dyspnea.  No dysuria, oliguria or hematuria, urgency or frequency or flank pain.  No nausea or vomiting or abdominal pain.  No other bleeding diathesis.  ED Course: Upon presentation to the emergency room, BP was 145/84 with a heart rate 101 and respiratory to 26 and otherwise normal vital signs.  Labs revealed a BUN of 28.0 glucose of 132 with total bili of 1.8 and total CK was 1099.  CBC showed leukocytosis 17.1 and mild anemia with hemoglobin 11 hematocrit 35.1 compared to 13.1 and 40.1 on 05/28/2023. EKG as reviewed by me : EKG showed atrial fibrillation with a rate of 102 with PVCs and nonspecific intraventricular conduction delay. Imaging: 2 view chest x-ray showed no acute  cardiopulmonary disease.  Bilateral knee x-ray showed herr knee arthroplasty with expected alignment without complications.  Lumbar spine x-ray showed moderate to severe degenerative changes of the lumbar spine with no acute fracture or malalignment.  The patient was given 4 mg of IV Zofran  and 2 mg of IV morphine  sulfate, as well as 1 L bolus of IV lactated ringer .  She will be admitted to an observation medical telemetry bed for further evaluation and management.     PAST MEDICAL HISTORY:   Past Medical History:  Diagnosis Date   Adenomatous colon polyp 07/31/2015   Anemia    resolved from the beginning of this year   Atrial fibrillation [facility])    cardioversion March 2004   Cholelithiasis    Chronic gastritis    CKD (chronic kidney disease)    chronic kidney disease stage III   Diverticulosis    DJD (degenerative joint disease)    B knees, B knee replacements, 1991/1996   DJD (degenerative joint disease)    DJD (degenerative joint disease)    Elbow injury    Fibromyalgia    HH (hiatus hernia)    Hypercholesterolemia    controlled with meds   Hypertension    controlled with meds, checks regularly   Irregular Z line of esophagus 11/21/2014   Liver cyst    Osteopenia    PPD positive    Reflux esophagitis    Renal insufficiency  Sleep apnea    Stroke [facility])    treated by Dr. Loreli    PAST SURGICAL HISTORY:   Past Surgical History:  Procedure Laterality Date   ADENOIDECTOMY     Adenomatous polyp of duodenum     APPENDECTOMY     BACK SURGERY     BILATERAL OOPHORECTOMY Right    BREAST BIOPSY Right 09/25/2015   stereo,benign   BREAST EXCISIONAL BIOPSY Right    1988 negative   BREAST EXCISIONAL BIOPSY Left    ? when,benign   CHOLECYSTECTOMY     COLONOSCOPY WITH PROPOFOL  N/A 10/29/2018   Procedure: COLONOSCOPY WITH PROPOFOL ;  Surgeon: Gaylyn Gladis PENNER, MD;  Location: [facility] ENDOSCOPY;  Service: Endoscopy;  Laterality: N/A;   DILATION AND CURETTAGE OF UTERUS  1966    ESOPHAGOGASTRODUODENOSCOPY N/A 10/29/2018   Procedure: ESOPHAGOGASTRODUODENOSCOPY (EGD);  Surgeon: Gaylyn Gladis PENNER, MD;  Location: [facility] ENDOSCOPY;  Service: Endoscopy;  Laterality: N/A;   ESOPHAGOGASTRODUODENOSCOPY (EGD) WITH PROPOFOL  N/A 07/31/2015   Procedure: ESOPHAGOGASTRODUODENOSCOPY (EGD) WITH PROPOFOL ;  Surgeon: Gladis PENNER Gaylyn, MD;  Location: [facility] ENDOSCOPY;  Service: Endoscopy;  Laterality: N/A;   ESOPHAGOGASTRODUODENOSCOPY (EGD) WITH PROPOFOL  N/A 05/06/2016   Procedure: ESOPHAGOGASTRODUODENOSCOPY (EGD) WITH PROPOFOL ;  Surgeon: Gladis PENNER Gaylyn, MD;  Location: [facility] ENDOSCOPY;  Service: Endoscopy;  Laterality: N/A;   ESOPHAGOGASTRODUODENOSCOPY (EGD) WITH PROPOFOL  N/A 10/30/2017   Procedure: ESOPHAGOGASTRODUODENOSCOPY (EGD) WITH PROPOFOL ;  Surgeon: Gaylyn Gladis PENNER, MD;  Location: [facility] ENDOSCOPY;  Service: Endoscopy;  Laterality: N/A;   ESOPHAGOGASTRODUODENOSCOPY (EGD) WITH PROPOFOL  N/A 04/27/2020   Procedure: ESOPHAGOGASTRODUODENOSCOPY (EGD) WITH PROPOFOL ;  Surgeon: Maryruth Ole DASEN, MD;  Location: [facility] ENDOSCOPY;  Service: Endoscopy;  Laterality: N/A;   ESOPHAGOGASTRODUODENOSCOPY (EGD) WITH PROPOFOL  N/A 06/24/2022   Procedure: ESOPHAGOGASTRODUODENOSCOPY (EGD) WITH PROPOFOL ;  Surgeon: Maryruth Ole DASEN, MD;  Location: [facility] ENDOSCOPY;  Service: Endoscopy;  Laterality: N/A;   JOINT REPLACEMENT Right    total knee Dr. Cleotilde with excessive postoperative bleeding   RHIZOTOMY     TONSILLECTOMY     TOTAL KNEE ARTHROPLASTY Bilateral    2001,2006   TUBAL LIGATION  1971    SOCIAL HISTORY:   Social History   Tobacco Use   Smoking status: Never   Smokeless tobacco: Never  Substance Use Topics   Alcohol use: No    Alcohol/week: 0.0 standard drinks of alcohol    FAMILY HISTORY:   Family History  Problem Relation Age of Onset   Breast cancer Mother 74   Diabetes Neg Hx    Heart disease Neg Hx    Colon cancer Neg Hx    Ovarian cancer Neg Hx     DRUG ALLERGIES:    Allergies  Allergen Reactions   Ciprofloxacin Nausea And Vomiting   Morphine  And Codeine Nausea And Vomiting    REVIEW OF SYSTEMS:   ROS As per history of present illness. All pertinent systems were reviewed above. Constitutional, HEENT, cardiovascular, respiratory, GI, GU, musculoskeletal, neuro, psychiatric, endocrine, integumentary and hematologic systems were reviewed and are otherwise negative/unremarkable except for positive findings mentioned above in the HPI.   MEDICATIONS AT HOME:   Prior to Admission medications   Medication Sig Start Date End Date Taking? Authorizing Provider  amLODipine  (NORVASC ) 5 MG tablet Take 5 mg by mouth daily.    [provider]  AMLODIPINE -ATORVASTATIN  PO Take by mouth.    [provider]  ascorbic acid  (VITAMIN C ) 1000 MG tablet Take 1,000 mg by mouth daily.    [provider]  aspirin  EC 81 MG tablet  Take 81 mg by mouth daily.    [provider]  aspirin -acetaminophen -caffeine (EXCEDRIN MIGRAINE) 250-250-65 MG per tablet Take by mouth every 6 (six) hours as needed for headache.    [provider]  atorvastatin  (LIPITOR) 10 MG tablet Take 5 mg by mouth daily.     [provider]  atorvastatin  (LIPITOR) 10 MG tablet Take 10 mg by mouth daily. 0.5 tablets by mouth at night.    [provider]  Black Cohosh-SoyIsoflav-Magnol (ESTROVEN MENOPAUSE RELIEF) CAPS Take by mouth at bedtime.     [provider]  calcium  carbonate 1250 MG capsule Take 1,250 mg by mouth 2 (two) times daily with a meal.    [provider]  cholecalciferol  (VITAMIN D ) 400 UNITS TABS tablet Take 400 Units by mouth.    [provider]  co-enzyme Q-10 30 MG capsule Take 30 mg by mouth daily.     [provider]  donepezil  (ARICEPT ) 5 MG tablet Take 5 mg by mouth at bedtime.    [provider]  DULoxetine  (CYMBALTA ) 20 MG capsule Take 30 mg by mouth daily.     [provider]  losartan  (COZAAR ) 100 MG tablet Take 100 mg by mouth daily.     [provider]  Multiple Vitamin (MULTIVITAMIN) tablet Take 1 tablet by mouth daily.    [provider]  Nutritional Supplements (ESTROVEN PMS PO) Take by mouth.    [provider]  pantoprazole  (PROTONIX ) 40 MG tablet Take 40 mg by mouth 2 (two) times daily.    [provider]  sertraline  (ZOLOFT ) 25 MG tablet Take 25 mg by mouth daily. 2 tablets everay am.    [provider]  UNABLE TO FIND     [provider]  vitamin B-12 (CYANOCOBALAMIN ) 100 MCG tablet Take 100 mcg by mouth daily.    [provider]      VITAL SIGNS:  Blood pressure (!) 140/59, pulse 97, temperature 97.7 F (36.5 C), temperature source Oral, resp. rate 17, height 5' 6 (1.676 m), weight 90.7 kg, SpO2 100%.  PHYSICAL EXAMINATION:  Physical Exam  GENERAL:  83 y.o.-year-old Caucasian female patient lying in the bed with no acute distress.  EYES: Pupils equal, round, reactive to light and accommodation. No scleral icterus. Extraocular muscles intact.  HEENT: Head atraumatic, normocephalic. Oropharynx and nasopharynx clear.  NECK:  Supple, no jugular venous distention. No thyroid enlargement, no tenderness.  LUNGS: Normal breath sounds bilaterally, no wheezing, rales,rhonchi or crepitation. No use of accessory muscles of respiration.  CARDIOVASCULAR: Regular rate and rhythm, S1, S2 normal. No murmurs, rubs, or gallops.  ABDOMEN: Soft, nondistended, nontender. Bowel sounds present. No organomegaly or mass.  EXTREMITIES: No pedal edema, cyanosis, or clubbing.  NEUROLOGIC: Cranial nerves II through XII are intact. Muscle strength 5/5 in all extremities. Sensation intact. Gait not checked.  PSYCHIATRIC: The patient is alert and oriented x 3.  Normal affect and good eye contact. SKIN: No obvious rash, lesion, or ulcer.   LABORATORY PANEL:   CBC Recent Labs  Lab 06/04/24 1928   WBC 17.1*  HGB 11.0*  HCT 35.1*  PLT 370   ------------------------------------------------------------------------------------------------------------------  Chemistries  Recent Labs  Lab 06/04/24 1928  NA 138  K 3.8  CL 103  CO2 22  GLUCOSE 132*  BUN 28*  CREATININE 0.92  CALCIUM  9.1  AST 36  ALT 14  ALKPHOS 68  BILITOT 1.8*   ------------------------------------------------------------------------------------------------------------------  Cardiac Enzymes No results for input(s): TROPONINI  in the last 168 hours. ------------------------------------------------------------------------------------------------------------------  RADIOLOGY:  DG Lumbar Spine 2-3 Views Result Date: 06/04/2024 CLINICAL DATA:  Fall EXAM: LUMBAR SPINE - 2-3 VIEW COMPARISON:  CT chest abdomen and pelvis 05/28/2023 FINDINGS: The bones are diffusely osteopenic. There is no evidence of lumbar spine fracture. Alignment is normal. There is diffuse moderate to severe intervertebral disc space narrowing with endplate osteophyte formation compatible with degenerative change. There also degenerative changes of facet joints diffusely. There is mild levoconvex curvature of the mid lumbar spine. L3-L5 laminectomy defects are again noted. There are atherosclerotic calcifications of the aorta and cholecystectomy clips. IMPRESSION: 1. No acute fracture or malalignment. 2. Moderate to severe degenerative changes of the lumbar spine. Electronically Signed   By: Greig Pique M.D.   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 No evidence for hardware loosening or acute fracture. No joint effusion. Joint spaces are well maintained. 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 No acute findings.  Electronically Signed   By: Greig Pique M.D.   On: 06/04/2024 20:50   DG Knee Complete 4 Views Left Result Date: 06/04/2024 EXAM: 4 or more VIEW(S) XRAY OF THE LEFT KNEE 06/04/2024 08:30:00 PM COMPARISON: 02/08/2021 CLINICAL HISTORY: Fall. Pt brought in by EMS from [facility] for a fall. FINDINGS: BONES AND JOINTS: Total knee arthroplasty in expected alignment without acute complication. No acute or periprosthetic fracture. Chronic ossific density posteriorly likely an intraarticular body. SOFT TISSUES: The soft tissues are unremarkable. IMPRESSION: 1. Left knee arthroplasty in expected alignment without complication. Electronically signed by: Andrea Gasman MD 06/04/2024 08:49 PM EDT RP Workstation: [id number]   DG Chest 2 View Result Date: 06/04/2024 CLINICAL DATA:  Weakness EXAM: CHEST - 2 VIEW COMPARISON:  None Available. FINDINGS: Lungs are well expanded, symmetric, and clear. No pneumothorax or pleural effusion. Cardiac size within normal limits. Pulmonary vascularity is normal. Osseous structures are age-appropriate. No acute bone abnormality. IMPRESSION: 1. No active cardiopulmonary disease. Electronically Signed   By: Dorethia Molt M.D.   On: 06/04/2024 20:40   CT Cervical Spine Wo Contrast Result Date: 06/04/2024 EXAM: CT CERVICAL SPINE WITHOUT CONTRAST 06/04/2024 07:58:37 PM TECHNIQUE: CT of the cervical spine was performed without the administration of intravenous contrast. Multiplanar reformatted images are provided for review. Automated exposure control, iterative reconstruction, and/or weight based adjustment of the mA/kV was utilized to reduce the radiation dose to as low as reasonably achievable. COMPARISON: CT 8 days ago 05/28/23 CLINICAL HISTORY: Neck trauma (Age >= 65y). Pt brought in by EMS from [facility] for a fall. Per pt, she fell at about 3am when she went to the bathroom. Found by family at 5pm today. Pt denies hitting her head or any  loss of consciousness. FINDINGS: CERVICAL SPINE: BONES AND ALIGNMENT: No acute fracture or traumatic malalignment. DEGENERATIVE CHANGES: Stable diffuse degenerative disc disease and facet hypertrophy. SOFT TISSUES: No prevertebral soft tissue swelling. IMPRESSION: 1. No acute abnormality of the cervical spine related to the reported neck trauma. 2. Stable diffuse degenerative disc disease and facet hypertrophy. Electronically signed by: Andrea Gasman MD 06/04/2024 08:09 PM EDT RP Workstation: [id number]   CT Head Wo Contrast Result Date: 06/04/2024 EXAM: CT HEAD WITHOUT CONTRAST 06/04/2024 07:58:37 PM TECHNIQUE: CT of the head was performed without the administration of intravenous  contrast. Automated exposure control, iterative reconstruction, and/or weight based adjustment of the mA/kV was utilized to reduce the radiation dose to as low as reasonably achievable. COMPARISON: 05/28/2023 CLINICAL HISTORY: Head trauma, minor (Age >= 65y). Pt brought in by EMS from [facility] for a fall. Per pt, she fell at about 3am when she went to the bathroom. Found by family at 5pm today. FINDINGS: BRAIN AND VENTRICLES: No acute hemorrhage. No evidence of acute infarct. No hydrocephalus. No extra-axial collection. No mass effect or midline shift. Stable atrophy and chronic small vessel ischemia. Remote high right frontal infarct. Unchanged lacunar infarcts in the left cerebellum and basal ganglia infarcts. ORBITS: No acute abnormality. SINUSES: No acute abnormality. SOFT TISSUES AND SKULL: Left parietal scalp hematoma. No skull fracture. IMPRESSION: 1. No acute intracranial abnormality. 2. Stable atrophy and chronic small vessel ischemia. Electronically signed by: Andrea Gasman MD 06/04/2024 08:05 PM EDT RP Workstation: [id number]      IMPRESSION AND PLAN:  Assessment and Plan: * Rhabdomyolysis - The patient will be admitted to a medical telemetry observation bed. - Will continue hydration with  IV normal saline. - Will follow CK levels. - Pain management will be provided. - PT consult will be obtained to assess for ambulation.  Paroxysmal atrial fibrillation with RVR (HCC) - She is not on anticoagulation likely for fall risk. - Will place her on small dose of beta-blocker therapy with Lopressor. -Will continue aspirin .  Leukocytosis - This could be related to stress demargination. - We will follow her urinalysis. - She has no current symptoms or signs of infectious etiology at this time  Essential hypertension - Will continue antihypertensive therapy.  Dyslipidemia - Will continue statin therapy.  Depression - Continue Zoloft  and Wellbutrin XL.  GERD without esophagitis - Will continue PPI therapy.  Dementia (HCC) - Will continue Aricept .   DVT prophylaxis: Lovenox .  Advanced Care Planning:  Code Status: full code.  Family Communication:  The plan of care was discussed in details with the patient (and family). I answered all questions. The patient agreed to proceed with the above mentioned plan. Further management will depend upon hospital course. Disposition Plan: Back to previous home environment Consults called: none.  All the records are reviewed and case discussed with ED provider.  Status is: Observation  I certify that at the time of admission, it is my clinical judgment that the patient will require hospital care extending less than 2 midnights.                            Dispo: The patient is from: [facility]               Anticipated d/c is to: [facility]               Patient currently is not medically stable to d/c.              Difficult to place patient: No  Madison DELENA Peaches M.D on 06/04/2024 at 11:18 PM  [facility]   From 7 PM-7 AM, contact night-coverage [email or web address]  CC: Primary care physician; [name], MD

## 2024-06-04 NOTE — Assessment & Plan Note (Signed)
 Will continue statin therapy

## 2024-06-04 NOTE — Assessment & Plan Note (Signed)
-   She is not on anticoagulation likely for fall risk. - Will place her on small dose of beta-blocker therapy with Lopressor. -Will continue aspirin .

## 2024-06-04 NOTE — ED Notes (Signed)
 This RN and Delon, EMT-P provided patient hygiene care prior to transportation to inpatient unit.

## 2024-06-04 NOTE — Assessment & Plan Note (Signed)
-   Will continue PPI therapy.

## 2024-06-04 NOTE — ED Provider Notes (Signed)
 [facility] Provider Note    Event Date/Time   First MD Initiated Contact with Patient 06/04/24 1831     (approximate)   History   Chief Complaint Fall   HPI  Jaclyn Galvan is a 83 y.o. female with past medical history of hypertension, hyperlipidemia, atrial fibrillation, anemia, and CKD who presents to the ED complaining of fall.  Patient reports that she was leaving the bathroom on her way to bed at 3 AM this morning when she tripped on the doorway and fell.  She does not think she hit her head or lost consciousness, but had a hard time getting herself up off of the ground.  She was eventually found by staff at her independent living facility this evening.  She complains of pain in her back and both knees, family is also concerned that she seems more confused than usual and patient reported seeing things that were not there.  She denies any fevers, cough, chest pain, shortness of breath, nausea, vomiting, diarrhea, or dysuria.     Physical Exam   Triage Vital Signs: ED Triage Vitals  Encounter Vitals Group     BP 06/04/24 1850 (!) 145/84     Girls Systolic BP Percentile --      Girls Diastolic BP Percentile --      Boys Systolic BP Percentile --      Boys Diastolic BP Percentile --      Pulse Rate 06/04/24 1850 99     Resp 06/04/24 1850 (!) 26     Temp 06/04/24 1850 97.7 F (36.5 C)     Temp Source 06/04/24 1850 Oral     SpO2 06/04/24 1850 100 %     Weight 06/04/24 1848 200 lb (90.7 kg)     Height 06/04/24 1848 5' 6 (1.676 m)     Head Circumference --      Peak Flow --      Pain Score 06/04/24 1847 5     Pain Loc --      Pain Education --      Exclude from Growth Chart --     Most recent vital signs: Vitals:   06/04/24 2200 06/04/24 2230  BP: (!) 155/105 (!) 152/63  Pulse: (!) 105 100  Resp: (!) 21 19  Temp:    SpO2: 94% 98%    Constitutional: Alert and oriented. Eyes: Conjunctivae are normal. Head: Atraumatic. Nose: No  congestion/rhinnorhea. Mouth/Throat: Mucous membranes are moist.  Neck: No midline cervical spine tenderness to palpation. Cardiovascular: Normal rate, regular rhythm. Grossly normal heart sounds.  2+ radial pulses bilaterally. Respiratory: Normal respiratory effort.  No retractions. Lungs CTAB.  No chest wall tenderness to palpation. Gastrointestinal: Soft and nontender. No distention. Musculoskeletal: Diffuse tenderness to palpation of bilateral knees with no obvious deformity.  No upper extremity bony tenderness to palpation. Neurologic:  Normal speech and language. No gross focal neurologic deficits are appreciated.    ED Results / Procedures / Treatments   Labs (all labs ordered are listed, but only abnormal results are displayed) Labs Reviewed  CBC WITH DIFFERENTIAL/PLATELET - Abnormal; Notable for the following components:      Result Value   WBC 17.1 (*)    Hemoglobin 11.0 (*)    HCT 35.1 (*)    Neutro Abs 15.6 (*)    Lymphs Abs 0.5 (*)    Abs Immature Granulocytes 0.11 (*)    All other components within normal limits  COMPREHENSIVE METABOLIC PANEL WITH  GFR - Abnormal; Notable for the following components:   Glucose, Bld 132 (*)    BUN 28 (*)    Total Bilirubin 1.8 (*)    All other components within normal limits  CK - Abnormal; Notable for the following components:   Total CK 1,099 (*)    All other components within normal limits  URINALYSIS, ROUTINE W REFLEX MICROSCOPIC     EKG  ED ECG REPORT I, Carlin Palin, the attending physician, personally viewed and interpreted this ECG.   Date: 06/04/2024  EKG Time: 18:58  Rate: 102  Rhythm: sinus tachycardia  Axis: Normal  Intervals:none  ST&T Change: None  RADIOLOGY CT head reviewed and interpreted by me with no hemorrhage or midline shift.  PROCEDURES:  Critical Care performed: No  Procedures   MEDICATIONS ORDERED IN ED: Medications  lactated ringers  bolus 1,000 mL (1,000 mLs Intravenous New  Bag/Given 06/04/24 2037)  morphine  (PF) 2 MG/ML injection 2 mg (2 mg Intravenous Given 06/04/24 2035)  ondansetron  (ZOFRAN ) injection 4 mg (4 mg Intravenous Given 06/04/24 2034)     IMPRESSION / MDM / ASSESSMENT AND PLAN / ED COURSE  I reviewed the triage vital signs and the nursing notes.                              83 y.o. female with past medical history of hyperlipidemia, hypertension, atrial fibrillation, anemia, and CKD who presents to the ED following fall greater than 12 hours ago with difficulty standing back up again.  Patient's presentation is most consistent with acute presentation with potential threat to life or bodily function.  Differential diagnosis includes, but is not limited to, sepsis, UTI, pneumonia, intracranial injury, cervical spine injury, anemia, electrolyte abnormality, AKI, rhabdomyolysis.  Patient nontoxic-appearing and in no acute distress, vital signs are unremarkable.  We will check CT head and cervical spine for evidence of traumatic injury, also check lumbar x-ray and x-rays of her bilateral knees.  Family reports patient has been more disoriented since the fall, we will screen chest x-ray and urinalysis for infectious process.  Labs with leukocytosis but no significant anemia, electrolyte abnormality, or AKI.  She does have significant elevation in CK level at over 1000, will hydrate with IV fluids.  CT head and cervical spine are negative for acute process, x-rays of bilateral knees and lumbar spine are also unremarkable.  Chest x-ray negative for acute finding, urinalysis pending.  Case discussed with hospitalist for admission.      FINAL CLINICAL IMPRESSION(S) / ED DIAGNOSES   Final diagnoses:  Fall, initial encounter  Generalized weakness  Traumatic rhabdomyolysis, initial encounter [facility])     Rx / DC Orders   ED Discharge Orders     None        Note:  This document was prepared using Dragon voice recognition software and may include  unintentional dictation errors.   Palin Carlin, MD 06/04/24 [phone]

## 2024-06-04 NOTE — Assessment & Plan Note (Signed)
-   This could be related to stress demargination. - We will follow her urinalysis. - She has no current symptoms or signs of infectious etiology at this time

## 2024-06-04 NOTE — Assessment & Plan Note (Signed)
 Continue Zoloft and Wellbutrin XL.

## 2024-06-04 NOTE — Assessment & Plan Note (Signed)
-   Will continue antihypertensive therapy.

## 2024-06-04 NOTE — ED Triage Notes (Signed)
 Pt brought in by EMS from [facility] for a fall. Per pt, she fell at about 3am when she went to the bathroom. Found by family at 5pm today. Pt denies hitting her head or any loss of consciousness.

## 2024-06-04 NOTE — Assessment & Plan Note (Signed)
-   Will continue Aricept .

## 2024-06-04 NOTE — Assessment & Plan Note (Signed)
-   The patient will be admitted to a medical telemetry observation bed. - Will continue hydration with IV normal saline. - Will follow CK levels. - Pain management will be provided. - PT consult will be obtained to assess for ambulation.

## 2024-06-05 DIAGNOSIS — F32A Depression, unspecified: Secondary | ICD-10-CM | POA: Diagnosis present

## 2024-06-05 DIAGNOSIS — N1831 Chronic kidney disease, stage 3a: Secondary | ICD-10-CM | POA: Diagnosis present

## 2024-06-05 DIAGNOSIS — W010XXA Fall on same level from slipping, tripping and stumbling without subsequent striking against object, initial encounter: Secondary | ICD-10-CM | POA: Diagnosis present

## 2024-06-05 DIAGNOSIS — F03A0 Unspecified dementia, mild, without behavioral disturbance, psychotic disturbance, mood disturbance, and anxiety: Secondary | ICD-10-CM

## 2024-06-05 DIAGNOSIS — D72829 Elevated white blood cell count, unspecified: Secondary | ICD-10-CM | POA: Diagnosis not present

## 2024-06-05 DIAGNOSIS — F0393 Unspecified dementia, unspecified severity, with mood disturbance: Secondary | ICD-10-CM | POA: Diagnosis present

## 2024-06-05 DIAGNOSIS — Z7189 Other specified counseling: Secondary | ICD-10-CM | POA: Diagnosis not present

## 2024-06-05 DIAGNOSIS — M6282 Rhabdomyolysis: Secondary | ICD-10-CM

## 2024-06-05 DIAGNOSIS — D631 Anemia in chronic kidney disease: Secondary | ICD-10-CM | POA: Diagnosis present

## 2024-06-05 DIAGNOSIS — D638 Anemia in other chronic diseases classified elsewhere: Secondary | ICD-10-CM | POA: Diagnosis not present

## 2024-06-05 DIAGNOSIS — R410 Disorientation, unspecified: Secondary | ICD-10-CM | POA: Diagnosis present

## 2024-06-05 DIAGNOSIS — W19XXXA Unspecified fall, initial encounter: Secondary | ICD-10-CM | POA: Diagnosis not present

## 2024-06-05 DIAGNOSIS — I129 Hypertensive chronic kidney disease with stage 1 through stage 4 chronic kidney disease, or unspecified chronic kidney disease: Secondary | ICD-10-CM | POA: Diagnosis present

## 2024-06-05 DIAGNOSIS — R531 Weakness: Secondary | ICD-10-CM | POA: Diagnosis present

## 2024-06-05 DIAGNOSIS — Z66 Do not resuscitate: Secondary | ICD-10-CM | POA: Diagnosis present

## 2024-06-05 DIAGNOSIS — E66811 Obesity, class 1: Secondary | ICD-10-CM | POA: Diagnosis present

## 2024-06-05 DIAGNOSIS — I493 Ventricular premature depolarization: Secondary | ICD-10-CM | POA: Diagnosis present

## 2024-06-05 DIAGNOSIS — M25561 Pain in right knee: Secondary | ICD-10-CM | POA: Diagnosis present

## 2024-06-05 DIAGNOSIS — K219 Gastro-esophageal reflux disease without esophagitis: Secondary | ICD-10-CM | POA: Diagnosis present

## 2024-06-05 DIAGNOSIS — T796XXA Traumatic ischemia of muscle, initial encounter: Secondary | ICD-10-CM | POA: Diagnosis present

## 2024-06-05 DIAGNOSIS — F0392 Unspecified dementia, unspecified severity, with psychotic disturbance: Secondary | ICD-10-CM | POA: Diagnosis present

## 2024-06-05 DIAGNOSIS — Y92031 Bathroom in apartment as the place of occurrence of the external cause: Secondary | ICD-10-CM | POA: Diagnosis not present

## 2024-06-05 DIAGNOSIS — E785 Hyperlipidemia, unspecified: Secondary | ICD-10-CM | POA: Diagnosis not present

## 2024-06-05 DIAGNOSIS — Z96653 Presence of artificial knee joint, bilateral: Secondary | ICD-10-CM | POA: Diagnosis present

## 2024-06-05 DIAGNOSIS — M8580 Other specified disorders of bone density and structure, unspecified site: Secondary | ICD-10-CM | POA: Diagnosis present

## 2024-06-05 DIAGNOSIS — M25562 Pain in left knee: Secondary | ICD-10-CM | POA: Diagnosis present

## 2024-06-05 DIAGNOSIS — I480 Paroxysmal atrial fibrillation: Secondary | ICD-10-CM | POA: Diagnosis present

## 2024-06-05 DIAGNOSIS — M797 Fibromyalgia: Secondary | ICD-10-CM | POA: Diagnosis present

## 2024-06-05 DIAGNOSIS — Z515 Encounter for palliative care: Secondary | ICD-10-CM

## 2024-06-05 DIAGNOSIS — M542 Cervicalgia: Secondary | ICD-10-CM | POA: Diagnosis present

## 2024-06-05 DIAGNOSIS — E7800 Pure hypercholesterolemia, unspecified: Secondary | ICD-10-CM | POA: Diagnosis present

## 2024-06-05 DIAGNOSIS — Y9301 Activity, walking, marching and hiking: Secondary | ICD-10-CM | POA: Diagnosis present

## 2024-06-05 DIAGNOSIS — E8779 Other fluid overload: Secondary | ICD-10-CM | POA: Diagnosis not present

## 2024-06-05 DIAGNOSIS — Z7982 Long term (current) use of aspirin: Secondary | ICD-10-CM | POA: Diagnosis not present

## 2024-06-05 LAB — URINALYSIS, ROUTINE W REFLEX MICROSCOPIC
Bacteria, UA: NONE SEEN
Bilirubin Urine: NEGATIVE
Glucose, UA: 50 mg/dL — AB
Hgb urine dipstick: NEGATIVE
Ketones, ur: 5 mg/dL — AB
Leukocytes,Ua: NEGATIVE
Nitrite: NEGATIVE
Protein, ur: 30 mg/dL — AB
Specific Gravity, Urine: 1.016 (ref 1.005–1.030)
Squamous Epithelial / HPF: 0 /HPF (ref 0–5)
pH: 6 (ref 5.0–8.0)

## 2024-06-05 LAB — CBC
HCT: 29.3 % — ABNORMAL LOW (ref 36.0–46.0)
Hemoglobin: 9.1 g/dL — ABNORMAL LOW (ref 12.0–15.0)
MCH: 27.8 pg (ref 26.0–34.0)
MCHC: 31.1 g/dL (ref 30.0–36.0)
MCV: 89.6 fL (ref 80.0–100.0)
Platelets: 277 K/uL (ref 150–400)
RBC: 3.27 MIL/uL — ABNORMAL LOW (ref 3.87–5.11)
RDW: 14.8 % (ref 11.5–15.5)
WBC: 12.7 K/uL — ABNORMAL HIGH (ref 4.0–10.5)
nRBC: 0 % (ref 0.0–0.2)

## 2024-06-05 LAB — BASIC METABOLIC PANEL WITH GFR
Anion gap: 10 (ref 5–15)
BUN: 35 mg/dL — ABNORMAL HIGH (ref 8–23)
CO2: 23 mmol/L (ref 22–32)
Calcium: 8.5 mg/dL — ABNORMAL LOW (ref 8.9–10.3)
Chloride: 106 mmol/L (ref 98–111)
Creatinine, Ser: 1.29 mg/dL — ABNORMAL HIGH (ref 0.44–1.00)
GFR, Estimated: 41 mL/min — ABNORMAL LOW (ref >60)
Glucose, Bld: 117 mg/dL — ABNORMAL HIGH (ref 70–99)
Potassium: 3.8 mmol/L (ref 3.5–5.1)
Sodium: 139 mmol/L (ref 135–145)

## 2024-06-05 LAB — CK: Total CK: 892 U/L — ABNORMAL HIGH (ref 38–234)

## 2024-06-05 MED ORDER — ATORVASTATIN CALCIUM 10 MG PO TABS
5.0000 mg | ORAL_TABLET | Freq: Every day | ORAL | Status: DC
  Administered 2024-06-05: 5 mg via ORAL
  Filled 2024-06-05: qty 0.5

## 2024-06-05 MED ORDER — SODIUM CHLORIDE 0.9 % IV SOLN: INTRAVENOUS | Status: DC

## 2024-06-05 MED ORDER — AMLODIPINE BESYLATE 5 MG PO TABS
5.0000 mg | ORAL_TABLET | Freq: Every day | ORAL | Status: DC
  Administered 2024-06-05 – 2024-06-09 (×5): 5 mg via ORAL
  Filled 2024-06-05 (×5): qty 1

## 2024-06-05 NOTE — Progress Notes (Addendum)
 PROGRESS NOTE    Jaclyn Galvan  FMW:[id number] DOB: [birth year] DOA: 06/04/2024 PCP: [name], MD   Brief Narrative:   83 y.o. Caucasian female with medical history significant for stage III chronic kidney disease, fibromyalgia, hypertension, paroxysmal atrial fibrillation, dyslipidemia, GAD, OSA and CVA, who presented to the ER with acute onset of fall.  The patient was leaving her bathroom on her way to bed at 3 AM this morning when she tripped on the doorway and fell.   8/31: PT, OT eval - SNF   Assessment & Plan:   Principal Problem:   Rhabdomyolysis Active Problems:   Paroxysmal atrial fibrillation with RVR (HCC)   Essential hypertension   Leukocytosis   Dyslipidemia   Dementia (HCC)   GERD without esophagitis   Depression   * Rhabdomyolysis - due to fall - continue hydration with IV normal saline. - monitor CK levels. - Pain management prn - PT recommends SNF    Paroxysmal atrial fibrillation with RVR (HCC) - not on anticoagulation likely for fall risk. - rate controlled - continue aspirin .   Leukocytosis - likely related to stress demargination. - await urinalysis. - She has no current symptoms or signs of infectious etiology at this time   Essential hypertension - norvasc    Dyslipidemia - hold statin therapy due to rhabdo   Depression - Continue Zoloft  and Wellbutrin XL.   GERD without esophagitis - continue PPI therapy.   Dementia (HCC) - continue Aricept .     DVT prophylaxis: (Lovenox       Code Status: DNR Family Communication: None at bedside Disposition Plan: possible D/C in 1-2 days depending on clinical condition, CK improvement. SNF     Subjective:  Denies any new issues, reports mechanical fall   Objective: Vitals:   06/05/24 0846 06/05/24 0856 06/05/24 1204 06/05/24 1313  BP: 102/82 (!) 106/56 (!) 112/54 (!) 112/54  Pulse: (!) 102 98 (!) 104 (!) 106  Resp: 18 20 18    Temp:   98.5 F (36.9 C)   TempSrc:    Oral   SpO2: 94% 94% 93%   Weight:      Height:        Intake/Output Summary (Last 24 hours) at 06/05/2024 1327 Last data filed at 06/05/2024 0900 Gross per 24 hour  Intake 0 ml  Output --  Net 0 ml   Filed Weights   06/04/24 1848  Weight: 90.7 kg    Examination:  General exam: Appears calm and comfortable  Respiratory system: Clear to auscultation. Respiratory effort normal. Cardiovascular system: S1 & S2 heard, RRR. No JVD, murmurs, rubs, gallops or clicks. No pedal edema. Gastrointestinal system: Abdomen is soft, benign Central nervous system: Alert and oriented. No focal neurological deficits. Extremities: Symmetric 5 x 5 power. Skin: No rashes, lesions or ulcers Psychiatry: Judgement and insight appear normal. Mood & affect appropriate.     Data Reviewed: I have personally reviewed following labs and imaging studies  CBC: Recent Labs  Lab 06/04/24 1928 06/05/24 0505  WBC 17.1* 12.7*  NEUTROABS 15.6*  --   HGB 11.0* 9.1*  HCT 35.1* 29.3*  MCV 88.4 89.6  PLT 370 277   Basic Metabolic Panel: Recent Labs  Lab 06/04/24 1928 06/05/24 0505  NA 138 139  K 3.8 3.8  CL 103 106  CO2 22 23  GLUCOSE 132* 117*  BUN 28* 35*  CREATININE 0.92 1.29*  CALCIUM  9.1 8.5*   GFR: Estimated Creatinine Clearance: 37.5 mL/min (A) (by C-G formula  based on SCr of 1.29 mg/dL (H)). Liver Function Tests: Recent Labs  Lab 06/04/24 1928  AST 36  ALT 14  ALKPHOS 68  BILITOT 1.8*  PROT 7.8  ALBUMIN 4.0   No results for input(s): LIPASE, AMYLASE in the last 168 hours. No results for input(s): AMMONIA in the last 168 hours. Coagulation Profile: No results for input(s): INR, PROTIME in the last 168 hours. Cardiac Enzymes: Recent Labs  Lab 06/04/24 1928 06/05/24 0505  CKTOTAL 1,099* 892*      Radiology Studies: DG Lumbar Spine 2-3 Views Result Date: 06/04/2024 CLINICAL DATA:  Fall EXAM: LUMBAR SPINE - 2-3 VIEW COMPARISON:  CT chest abdomen and pelvis  05/28/2023 FINDINGS: The bones are diffusely osteopenic. There is no evidence of lumbar spine fracture. Alignment is normal. There is diffuse moderate to severe intervertebral disc space narrowing with endplate osteophyte formation compatible with degenerative change. There also degenerative changes of facet joints diffusely. There is mild levoconvex curvature of the mid lumbar spine. L3-L5 laminectomy defects are again noted. There are atherosclerotic calcifications of the aorta and cholecystectomy clips. IMPRESSION: 1. No acute fracture or malalignment. 2. Moderate to severe degenerative changes of the lumbar spine. Electronically Signed   By: Greig Pique M.D.   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 No evidence for hardware loosening or acute fracture. No joint effusion. Joint spaces are well maintained. 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 No acute findings. Electronically Signed   By: Greig Pique M.D.   On: 06/04/2024 20:50   DG Knee Complete 4 Views Left Result Date: 06/04/2024 EXAM: 4 or more VIEW(S) XRAY OF THE LEFT KNEE 06/04/2024 08:30:00 PM COMPARISON: 02/08/2021 CLINICAL HISTORY: Fall. Pt brought in by EMS from [facility] for a fall. FINDINGS: BONES AND JOINTS: Total knee arthroplasty in expected alignment without acute complication. No acute or periprosthetic fracture. Chronic ossific density posteriorly likely an intraarticular body. SOFT TISSUES: The soft tissues are unremarkable. IMPRESSION: 1. Left knee arthroplasty in expected alignment without complication. Electronically signed by: Andrea Gasman MD 06/04/2024 08:49 PM EDT RP Workstation: [id number]   DG Chest 2 View Result Date: 06/04/2024 CLINICAL DATA:  Weakness EXAM: CHEST - 2 VIEW  COMPARISON:  None Available. FINDINGS: Lungs are well expanded, symmetric, and clear. No pneumothorax or pleural effusion. Cardiac size within normal limits. Pulmonary vascularity is normal. Osseous structures are age-appropriate. No acute bone abnormality. IMPRESSION: 1. No active cardiopulmonary disease. Electronically Signed   By: Dorethia Molt M.D.   On: 06/04/2024 20:40   CT Cervical Spine Wo Contrast Result Date: 06/04/2024 EXAM: CT CERVICAL SPINE WITHOUT CONTRAST 06/04/2024 07:58:37 PM TECHNIQUE: CT of the cervical spine was performed without the administration of intravenous contrast. Multiplanar reformatted images are provided for review. Automated exposure control, iterative reconstruction, and/or weight based adjustment of the mA/kV was utilized to reduce the radiation dose to as low as reasonably achievable. COMPARISON: CT 8 days ago 05/28/23 CLINICAL HISTORY: Neck trauma (Age >= 65y). Pt brought in by EMS from [facility] for a fall. Per pt, she fell at about 3am when she went to the bathroom. Found by family at 5pm today. Pt denies hitting her head or any loss of consciousness. FINDINGS: CERVICAL SPINE: BONES AND ALIGNMENT: No acute fracture or traumatic malalignment. DEGENERATIVE  CHANGES: Stable diffuse degenerative disc disease and facet hypertrophy. SOFT TISSUES: No prevertebral soft tissue swelling. IMPRESSION: 1. No acute abnormality of the cervical spine related to the reported neck trauma. 2. Stable diffuse degenerative disc disease and facet hypertrophy. Electronically signed by: Andrea Gasman MD 06/04/2024 08:09 PM EDT RP Workstation: [id number]   CT Head Wo Contrast Result Date: 06/04/2024 EXAM: CT HEAD WITHOUT CONTRAST 06/04/2024 07:58:37 PM TECHNIQUE: CT of the head was performed without the administration of intravenous contrast. Automated exposure control, iterative reconstruction, and/or weight based adjustment of the mA/kV was utilized to reduce the radiation  dose to as low as reasonably achievable. COMPARISON: 05/28/2023 CLINICAL HISTORY: Head trauma, minor (Age >= 65y). Pt brought in by EMS from [facility] for a fall. Per pt, she fell at about 3am when she went to the bathroom. Found by family at 5pm today. FINDINGS: BRAIN AND VENTRICLES: No acute hemorrhage. No evidence of acute infarct. No hydrocephalus. No extra-axial collection. No mass effect or midline shift. Stable atrophy and chronic small vessel ischemia. Remote high right frontal infarct. Unchanged lacunar infarcts in the left cerebellum and basal ganglia infarcts. ORBITS: No acute abnormality. SINUSES: No acute abnormality. SOFT TISSUES AND SKULL: Left parietal scalp hematoma. No skull fracture. IMPRESSION: 1. No acute intracranial abnormality. 2. Stable atrophy and chronic small vessel ischemia. Electronically signed by: Andrea Gasman MD 06/04/2024 08:05 PM EDT RP Workstation: [id number]        Scheduled Meds:  amLODipine   5 mg Oral Daily   ascorbic acid   1,000 mg Oral Daily   aspirin  EC  81 mg Oral Daily   calcium  carbonate  1,250 mg Oral BID   cholecalciferol   500 Units Oral Daily   DULoxetine   30 mg Oral Daily   enoxaparin  (LOVENOX ) injection  0.5 mg/kg Subcutaneous QHS   multivitamin with minerals  1 tablet Oral Daily   pantoprazole   20 mg Oral Daily   vitamin B-12  100 mcg Oral Daily   Continuous Infusions:  sodium chloride  100 mL/hr at 06/05/24 1100     LOS: 0 days    Time spent: 35 mins    Reygan Heagle Maree, MD [facility] Pager [phone]  If 7PM-7AM, please contact night-coverage [email or web address]  06/05/2024, 1:27 PM

## 2024-06-05 NOTE — Consult Note (Signed)
 Consultation Note Date: 06/05/2024   Patient Name: Jaclyn Galvan  DOB: January 17, 1941  MRN: [id number]  Age / Sex: 83 y.o., female  PCP: [name], MD Referring Physician: Maree Hue, MD  Reason for Consultation: Establishing goals of care   HPI/Brief Hospital Course: 83 y.o. female  with past medical history of CKD stage 3a, fibromyalgia, HTN, PAF not on AC, HLD, OSA, CVA, osteopenia, mild cognitive impairment, vocal cord dysfunction and vitamin B12 deficiency admitted from [facility] on 06/04/2024 s/p fall, found by son-in-law and neighbors, thought to have been down about 15 hours. Noted AMS and visual hallucinations when found on floor.  In ED, imaging negative for acute fractures, CK 1099, leukocytosis Admitted and bring treated for rhabdomyolysis, possible infection--urine culture pending  Palliative medicine was consulted for assisting with goals of care conversations.  Subjective:  Extensive chart review has been completed prior to meeting patient including labs, vital signs, imaging, progress notes, orders, and available advanced directive documents from current and previous encounters.  Visited with Ms. Foskett at her bedside. She is awake, alert, lying in bed, answers orientation questions appropriately. No family or visitors at bedside during time of visit.  Introduced myself as a Publishing rights manager as a member of the palliative care team. Explained palliative medicine is specialized medical care for people living with serious illness. It focuses on providing relief from the symptoms and stress of a serious illness. The goal is to improve quality of life for both the patient and the family.   Ms. Boxley shares a brief life review. She has been a resident at [facility] for 2 years. 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 She is currently widowed, has two  daughters that live local and visit with her about 3-4 times per week. She enjoys spending her time in her apartment watching television, she shares she does not live an active life. At baseline she is able to independently perform ADL's and does not require assistance. She is not involved in activities held at her facility.  Ms. Charles shares she recalls falling on the floor, was headed to the bathroom to get ready for bed. She did not have a way to get in contact with anyone and was not able to get herself off of the floor.  Assessed symptoms, she denies acute pain or discomfort. She reports after fall initially she was having neck, low back and bilateral shoulder pain. She shares the pain and discomfort has resolved. She denies difficulty sleeping. She reports no recent changes in appetite, she practices intermittent fasting.  We discussed patient's current illness and what it means in the larger context of patient's on-going co-morbidities.   Ms. Plazola shares she has completed Advanced Directives in the past, she has appointed her daughter [name] as [place]. We discussed Code Status and the difference between Full Code and Do Not Resuscitate. Ms. Fukushima is clear in sharing that he desire is for DNR/DNI.  Called and spoke with daughter-Mary. [name] confirms above information provided by Ms. Loge. [name] speaks to her mothers cognitive and functional decline over the last several months. [name] shares she has been in communication with her sister and they have discussed possibility of hiring aids to visit with Ms. Ekstrand on the days they are not able to visit. They currently have cameras up in her apartment to check in on her as well. [name] also confirms Ms. Manthe wishes for  DNR/DNI status.  I discussed importance of continued conversations with family/support persons and all members of their medical team regarding overall plan of care and treatment options ensuring decisions are in alignment with patients  goals of care.  All questions/concerns addressed. Emotional support provided to patient/family/support persons. PMT will continue to follow and support patient as needed.  Objective: Primary Diagnoses: Present on Admission:  Rhabdomyolysis   Physical Exam Constitutional:      General: She is not in acute distress.    Appearance: She is not ill-appearing.  Pulmonary:     Effort: Pulmonary effort is normal. No respiratory distress.  Skin:    General: Skin is warm and dry.  Neurological:     Mental Status: She is alert and oriented to person, place, and time.     Motor: Weakness present.  Psychiatric:        Mood and Affect: Mood normal.        Behavior: Behavior normal.        Thought Content: Thought content normal.     Vital Signs: BP (!) 112/54   Pulse (!) 106 Comment: End of session 125  Temp 98.5 F (36.9 C) (Oral)   Resp 18   Ht 5' 6 (1.676 m)   Wt 90.7 kg   SpO2 93%   BMI 32.28 kg/m  Pain Scale: 0-10   Pain Score: 2    IO: Intake/output summary:  Intake/Output Summary (Last 24 hours) at 06/05/2024 1339 Last data filed at 06/05/2024 0900 Gross per 24 hour  Intake 0 ml  Output --  Net 0 ml    LBM: Last BM Date : 06/02/24 Baseline Weight: Weight: 90.7 kg Most recent weight: Weight: 90.7 kg       Palliative Assessment/Data: 80%   Assessment and Plan  SUMMARY OF RECOMMENDATIONS   DNR/DNI  Palliative Prophylaxis:   Bowel Regimen, Delirium Protocol and Frequent Pain Assessment  Discussed With: Primary team   Thank you for this consult and allowing Palliative Medicine to participate in the care of Hafsah Hendler. [place]. Palliative medicine will continue to follow and assist as needed.   Time Total: 75 minutes  Time spent includes: Detailed review of medical records (labs, imaging, vital signs), medically appropriate exam (mental status, respiratory, cardiac, skin), discussed with treatment team, counseling and educating patient, family and staff,  documenting clinical information, medication management and coordination of care.   Signed by: Waddell Lesches, DNP, AGNP-C Palliative Medicine    Please contact Palliative Medicine Team phone at [phone] for questions and concerns.  For individual provider: See Tracey

## 2024-06-05 NOTE — Plan of Care (Signed)

## 2024-06-05 NOTE — Evaluation (Signed)
 Physical Therapy Evaluation Patient Details Name: Jaclyn Galvan MRN: [id number] DOB: [birth year] Today's Date: 06/05/2024  History of Present Illness  Jaclyn Galvan is a 83 y.o. Caucasian female with medical history significant for stage III chronic kidney disease, fibromyalgia, hypertension, paroxysmal atrial fibrillation, dyslipidemia, GAD, OSA and CVA, who presented to the ER with acute onset of fall.  The patient was leaving her bathroom on her way to bed at 3 AM this morning when she tripped on the doorway and fell.  She denies any headache or dizziness or blurred vision.  No presyncope or syncope.  She denies any head injuries.  She had a very hard time getting off of the ground.  She was eventually found by her staff at her independent living facility this evening.  She was having back pain and bilateral knee pain.  Per her family she seemed more confused than her usual.  She also reported visual hallucinations seeing things on the ceiling.  She denied any fever or chills.  No cough or wheezing or dyspnea.  No dysuria, oliguria or hematuria, urgency or frequency or flank pain.  No nausea or vomiting or abdominal pain.  No other bleeding diathesis.   Clinical Impression  Pt admitted with above diagnosis. Pt currently with functional limitations due to the deficits listed below (see PT Problem List). Pt received upright in bed agreeable to PT eval. Reports PTA being mod-I with her rollator and ADL's. Daughter provides transportation and facility provides meals.   To date, pt needs moderate assist for bed mobility, static sitting due to heavy posterior lean, and for STS. Pt with severe posterior lean in standing but improves upon standing to RW. Pt ambulates 70' CGA needing min A on RW and mod multi modal cuing for RW placement and body positioning within RW. As pt fatigues pt displays worsening forward lean onto RW greatly increasing forward LOB despite multi modal cuing to correct. Pt left in  recliner post gait with HR up to 130 BPM. HR lowers with seated rest. Pt with all needs in reach. Pt with poor insight into deficits and poor use of AD safely with recurrent falls at ILF. Due to this pt will benefit from skilled PT services < 3 hours/day to address these deficits and maximize return to PLOF.      If plan is discharge home, recommend the following: A little help with bathing/dressing/bathroom;A lot of help with walking and/or transfers;Assistance with cooking/housework;Assist for transportation;Help with stairs or ramp for entrance   Can travel by private vehicle   Yes    Equipment Recommendations Other (comment) (TBD by next venue of care)  Recommendations for Other Services       Functional Status Assessment Patient has had a recent decline in their functional status and demonstrates the ability to make significant improvements in function in a reasonable and predictable amount of time.     Precautions / Restrictions Precautions Precautions: Fall Restrictions Weight Bearing Restrictions Per Provider Order: No      Mobility  Bed Mobility Overal bed mobility: Needs Assistance Bed Mobility: Supine to Sit     Supine to sit: Mod assist, HOB elevated, Used rails     General bed mobility comments: mod multi modal cuing for form/technique Patient Response: Cooperative, Flat affect  Transfers Overall transfer level: Needs assistance Equipment used: Rolling walker (2 wheels) Transfers: Sit to/from Stand Sit to Stand: Mod assist           General transfer comment: ModA with  heavy posterior lean. Poor ability to translater body anterior    Ambulation/Gait Ambulation/Gait assistance: Contact guard assist, Min assist Gait Distance (Feet): 70 Feet Assistive device: Rolling walker (2 wheels) Gait Pattern/deviations: Step-to pattern, Trunk flexed       General Gait Details: pt with very poor insight into deficits. Forward truncal lean difficulty keeping RW  closer to BOS. With turns pt keeps L foot outside of RW BOS needing multi modal cuing for only fair carryover  Stairs            Wheelchair Mobility     Tilt Bed Tilt Bed Patient Response: Cooperative, Flat affect  Modified Rankin (Stroke Patients Only)       Balance Overall balance assessment: Needs assistance Sitting-balance support: Bilateral upper extremity supported, No upper extremity supported Sitting balance-Leahy Scale: Fair     Standing balance support: Bilateral upper extremity supported Standing balance-Leahy Scale: Poor Standing balance comment: heavy BUE support on RW                             Pertinent Vitals/Pain Pain Assessment Pain Assessment: Faces Pain Location: cervical spine Pain Descriptors / Indicators: Discomfort Pain Intervention(s): Limited activity within patient's tolerance, Monitored during session, Repositioned    Home Living                     Additional Comments: Independent living at [facility]    Prior Function Prior Level of Function : Independent/Modified Independent             Mobility Comments: Patient uses a rollator ADLs Comments: Patient independent in bathing and dressing wearing mostly slippers.  Patient has a Sports administrator and a sock aid, patient has bars and walk-in shower with a chair.  Raised toilet seat.  Patient does her own medication.  Daughter does grocery shopping.  Patient goes for supper to dining room     Extremity/Trunk Assessment   Upper Extremity Assessment Upper Extremity Assessment: Overall WFL for tasks assessed (Patient is left shoulder range of motion limited because of rotator cuff issues or arthritis per patient)    Lower Extremity Assessment Lower Extremity Assessment: Generalized weakness       Communication   Communication Communication: No apparent difficulties    Cognition Arousal: Alert Behavior During Therapy: WFL for tasks assessed/performed   PT -  Cognitive impairments: No apparent impairments                         Following commands: Intact       Cueing Cueing Techniques: Verbal cues     General Comments General comments (skin integrity, edema, etc.): HR up to 130 BPM.    Exercises Other Exercises Other Exercises: Role of PT in acute setting. Safe use of DME.   Assessment/Plan    PT Assessment Patient needs continued PT services  PT Problem List Decreased strength;Decreased activity tolerance;Decreased balance;Decreased mobility;Decreased knowledge of use of DME;Decreased safety awareness       PT Treatment Interventions DME instruction;Balance training;Gait training;Neuromuscular re-education;Stair training;Functional mobility training;Patient/family education;Therapeutic activities;Therapeutic exercise    PT Goals (Current goals can be found in the Care Plan section)  Acute Rehab PT Goals Patient Stated Goal: to return to ILF PT Goal Formulation: With patient Time For Goal Achievement: 06/19/24 Potential to Achieve Goals: Good    Frequency Min 3X/week     Co-evaluation  AM-PAC PT 6 Clicks Mobility  Outcome Measure Help needed turning from your back to your side while in a flat bed without using bedrails?: A Lot Help needed moving from lying on your back to sitting on the side of a flat bed without using bedrails?: A Little Help needed moving to and from a bed to a chair (including a wheelchair)?: A Little Help needed standing up from a chair using your arms (e.g., wheelchair or bedside chair)?: A Lot Help needed to walk in hospital room?: A Lot Help needed climbing 3-5 steps with a railing? : Total 6 Click Score: 13    End of Session Equipment Utilized During Treatment: Gait belt Activity Tolerance: Patient tolerated treatment well Patient left: in chair;with call bell/phone within reach;with chair alarm set Nurse Communication: Mobility status PT Visit Diagnosis: Other  abnormalities of gait and mobility (R26.89);Repeated falls (R29.6);Muscle weakness (generalized) (M62.81);Difficulty in walking, not elsewhere classified (R26.2)    Time: 8881-8856 PT Time Calculation (min) (ACUTE ONLY): 25 min   Charges:   PT Evaluation $PT Eval Moderate Complexity: 1 Mod PT Treatments $Gait Training: 8-22 mins PT General Charges $$ ACUTE PT VISIT: 1 Visit        Dorina HERO. Fairly IV, PT, DPT Physical Therapist- [place]  [facility] 06/05/2024, 1:27 PM

## 2024-06-05 NOTE — Evaluation (Signed)
 Occupational Therapy Evaluation Patient Details Name: Jaclyn Galvan MRN: [id number] DOB: July 18, 1941 Today's Date: 06/05/2024   History of Present Illness   Jaclyn Galvan is a 83 y.o. Caucasian female with medical history significant for stage III chronic kidney disease, fibromyalgia, hypertension, paroxysmal atrial fibrillation, dyslipidemia, GAD, OSA and CVA, who presented to the ER with acute onset of fall.  The patient was leaving her bathroom on her way to bed at 3 AM this morning when she tripped on the doorway and fell.  She denies any headache or dizziness or blurred vision.  No presyncope or syncope.  She denies any head injuries.  She had a very hard time getting off of the ground.  She was eventually found by her staff at her independent living facility this evening.  She was having back pain and bilateral knee pain.  Per her family she seemed more confused than her usual.  She also reported visual hallucinations seeing things on the ceiling.  She denied any fever or chills.  No cough or wheezing or dyspnea.  No dysuria, oliguria or hematuria, urgency or frequency or flank pain.  No nausea or vomiting or abdominal pain.  No other bleeding diathesis.     Clinical Impressions Upon arrival by OT patient up in recliner.  Willing to work.  Patient reports she had 7 falls in the last few months.  Mostly when picking up something off the floor or over the threshold.  Patient do live in independent living.  Using a rollator.  Has a walk-in shower with a chair.  No hand-held shower.  Patient is life alert was charging and could not get to it.  Daughter does grocery shopping.  Patient goes for supper to the dining hall.  Bilateral upper extremities within functional limits with patient with decreased shoulder range of motion and strength secondary to rotator cuff or arthritis per patient.  Static sitting balance good but upon attempts for hip flexion patient fell backwards.  Patient sit to stand mod  assist by OT.  Patient uses a lift chair at home.  Patient contact-guard with rolling walker to bathroom.  Need mod verbal cueing for staying close to walker and picking up her feet.  Patient shuffles.  Attempts with hip flexion patient leaned backward.  Sit to stand from bedside commode with min assist.  Clothing management contact-guard to supervision.  Patient has adaptive equipment for lower body ADLs.  But for the moment needs min to mod assist for bathing and lower body ADLs.  Discussed with patient doing a prone or back for walker to carry phone with her.  Patient does not have a mobile phone or cell phone.  Discussed with her getting a cheap patient go phone and keep it in a.  Altamese back on the walker together with her medication and remote.  Patient limited in ADLs and IADLs by decreased strength decreased balance and activity tolerance.  Patient's heart rate at start of care was 104 and at the end 125.  Would recommend for patient going for rehab.  Patient is willing and interested.  Patient can continue to benefit from skilled OT services in this setting for increase strength increase balance increase independence in ADLs.     If plan is discharge home, recommend the following:     Rehab    Functional Status Assessment         Equipment Recommendations    Rolling walker     Recommendations for Other Services  OT and PT     Precautions/Restrictions   Precautions Precautions: None;Fall Restrictions Weight Bearing Restrictions Per Provider Order: No     Mobility Bed Mobility                    Transfers   Equipment used: Rolling walker (2 wheels)               General transfer comment: Mod assist for sit to stand.  Patient reports using a lift chair or daughter to assist her.  Toilet transfers min assist from raised seat      Balance Overall balance assessment: Needs assistance Sitting-balance support: No upper extremity supported Sitting  balance-Leahy Scale: Fair Sitting balance - Comments: With hip flexion patient follows posteriorly Postural control: Posterior lean Standing balance support: Bilateral upper extremity supported Standing balance-Leahy Scale: Poor Standing balance comment: Needs support on walker.                           ADL either performed or assessed with clinical judgement   ADL                                         General ADL Comments: Eating and grooming independent with set up ;upper body dressing independent with set up; assistance with back for bathing.  Lower body dressing patient wears slippers.  And a reacher for putting her pants on.  Patient has a sock aid at home.  Lower body bathing min assist     Vision Baseline Vision/History: 1 Wears glasses Patient Visual Report: No change from baseline       Perception         Praxis         Pertinent Vitals/Pain Pain Assessment Pain Assessment: 0-10 Pain Score: 1  Pain Location: Posterior neck since the fall Pain Descriptors / Indicators: Discomfort     Extremity/Trunk Assessment Upper Extremity Assessment Upper Extremity Assessment: Overall WFL for tasks assessed (Patient is left shoulder range of motion limited because of rotator cuff issues or arthritis per patient)   Lower Extremity Assessment Lower Extremity Assessment: Generalized weakness       Communication Communication Communication: No apparent difficulties   Cognition Arousal: Alert Behavior During Therapy: WFL for tasks assessed/performed                                 Following commands: Intact       Cueing  General Comments   Cueing Techniques: Verbal cues (Safety using a walker with hand placements and staying in the walker, picking up her feet.  However safety sitting down)  HR up to 130 BPM.   Exercises Other Exercises Other Exercises: Reviewed with patient safety staying inside her walker picking up  her feet.  With backing up to the chair sitting down needs to feel chair behind her legs and reaching back with her hands.  Needs verbal cueing Other Exercises: Patient has a reacher and sock aid needs review and education for lower body ADLs. Other Exercises: Patient has had life alert but it was charging.  Recommend for patient to get a cheap pay as you go phone and carry that with her and back on her walker or putting a little apron on with pockets and carrying her  medication in her phone and a remote with her   Shoulder Instructions      Home Living                                   Additional Comments: Independent living at [facility]      Prior Functioning/Environment Prior Level of Function : Independent/Modified Independent             Mobility Comments: Patient uses a rollator ADLs Comments: Patient independent in bathing and dressing wearing mostly slippers.  Patient has a Sports administrator and a sock aid, patient has bars and walk-in shower with a chair.  Raised toilet seat.  Patient does her own medication.  Daughter does grocery shopping.  Patient goes for supper to dining room    OT Problem List: Decreased strength;Decreased safety awareness;Decreased activity tolerance;Decreased knowledge of use of DME or AE;Impaired balance (sitting and/or standing)   OT Treatment/Interventions: Self-care/ADL training;Therapeutic activities;Therapeutic exercise;Neuromuscular education;Patient/family education;DME and/or AE instruction;Balance training      OT Goals(Current goals can be found in the care plan section)   Acute Rehab OT Goals Patient Stated Goal: Would like to get my balance better than I do not fall as much OT Goal Formulation: With patient Time For Goal Achievement: 06/13/24 Potential to Achieve Goals: Good   OT Frequency:  Min 2X/week    Co-evaluation              AM-PAC OT 6 Clicks Daily Activity     Outcome Measure Help from another person  eating meals?: A Little Help from another person taking care of personal grooming?: A Little Help from another person toileting, which includes using toliet, bedpan, or urinal?: A Little Help from another person bathing (including washing, rinsing, drying)?: A Lot Help from another person to put on and taking off regular upper body clothing?: A Little Help from another person to put on and taking off regular lower body clothing?: A Lot 6 Click Score: 16   End of Session Equipment Utilized During Treatment: Gait belt;Rolling walker (2 wheels) Nurse Communication: Mobility status  Activity Tolerance: Patient tolerated treatment well Patient left: in chair;with call bell/phone within reach;with chair alarm set  OT Visit Diagnosis: Unsteadiness on feet (R26.81);Repeated falls (R29.6);Muscle weakness (generalized) (M62.81);History of falling (Z91.81)                Time: 1150-1230 OT Time Calculation (min): 40 min Charges:  OT General Charges $OT Visit: 1 Visit OT Evaluation $OT Eval Low Complexity: 1 Low OT Treatments $Self Care/Home Management : 8-22 mins   Jonne Rote OTR/L,CLT 06/05/2024, 1:24 PM

## 2024-06-06 DIAGNOSIS — I480 Paroxysmal atrial fibrillation: Secondary | ICD-10-CM | POA: Diagnosis not present

## 2024-06-06 DIAGNOSIS — R531 Weakness: Secondary | ICD-10-CM

## 2024-06-06 DIAGNOSIS — Z515 Encounter for palliative care: Secondary | ICD-10-CM | POA: Diagnosis not present

## 2024-06-06 DIAGNOSIS — Z7189 Other specified counseling: Secondary | ICD-10-CM

## 2024-06-06 DIAGNOSIS — Z66 Do not resuscitate: Secondary | ICD-10-CM

## 2024-06-06 DIAGNOSIS — M6282 Rhabdomyolysis: Secondary | ICD-10-CM | POA: Diagnosis not present

## 2024-06-06 DIAGNOSIS — Z789 Other specified health status: Secondary | ICD-10-CM

## 2024-06-06 DIAGNOSIS — W19XXXA Unspecified fall, initial encounter: Secondary | ICD-10-CM

## 2024-06-06 DIAGNOSIS — K219 Gastro-esophageal reflux disease without esophagitis: Secondary | ICD-10-CM | POA: Diagnosis not present

## 2024-06-06 DIAGNOSIS — D72829 Elevated white blood cell count, unspecified: Secondary | ICD-10-CM | POA: Diagnosis not present

## 2024-06-06 LAB — CBC
HCT: 25.3 % — ABNORMAL LOW (ref 36.0–46.0)
Hemoglobin: 7.9 g/dL — ABNORMAL LOW (ref 12.0–15.0)
MCH: 28.2 pg (ref 26.0–34.0)
MCHC: 31.2 g/dL (ref 30.0–36.0)
MCV: 90.4 fL (ref 80.0–100.0)
Platelets: 199 K/uL (ref 150–400)
RBC: 2.8 MIL/uL — ABNORMAL LOW (ref 3.87–5.11)
RDW: 15.2 % (ref 11.5–15.5)
WBC: 5.7 K/uL (ref 4.0–10.5)
nRBC: 0 % (ref 0.0–0.2)

## 2024-06-06 LAB — BASIC METABOLIC PANEL WITH GFR
Anion gap: 6 (ref 5–15)
BUN: 27 mg/dL — ABNORMAL HIGH (ref 8–23)
CO2: 25 mmol/L (ref 22–32)
Calcium: 8.4 mg/dL — ABNORMAL LOW (ref 8.9–10.3)
Chloride: 107 mmol/L (ref 98–111)
Creatinine, Ser: 1.22 mg/dL — ABNORMAL HIGH (ref 0.44–1.00)
GFR, Estimated: 44 mL/min — ABNORMAL LOW (ref >60)
Glucose, Bld: 111 mg/dL — ABNORMAL HIGH (ref 70–99)
Potassium: 3.7 mmol/L (ref 3.5–5.1)
Sodium: 138 mmol/L (ref 135–145)

## 2024-06-06 NOTE — Progress Notes (Signed)
 Mobility Specialist - Progress Note     06/06/24 1453  Mobility  Activity Ambulated with assistance;Stood at bedside  Level of Assistance Moderate assist, patient does 50-74%  Assistive Device Front wheel walker  Distance Ambulated (ft) 80 ft  Range of Motion/Exercises Active  Activity Response Tolerated well  Mobility Referral Yes  Mobility visit 1 Mobility   Pt resting in recliner on RA upon entry. Pt STS MaxA and ambulates to hallway Min/ModA for safety with RW. Pt had a difficult time STS and required manual assistance using the gait belt for support due to instability when standing and backward trunk lean. Pt returned to recliner and left with needs in reach. Chair alarm activated.    Guido Rumble Mobility Specialist 06/06/24, 3:03 PM

## 2024-06-06 NOTE — Progress Notes (Signed)
 PROGRESS NOTE    Jaclyn Galvan  FMW:[id number] DOB: 06/21/1941 DOA: 06/04/2024 PCP: [name], MD   Brief Narrative:   83 y.o. Caucasian female with medical history significant for stage III chronic kidney disease, fibromyalgia, hypertension, paroxysmal atrial fibrillation, dyslipidemia, GAD, OSA and CVA, who presented to the ER with acute onset of fall.  The patient was leaving her bathroom on her way to bed at 3 AM this morning when she tripped on the doorway and fell.   8/31: PT, OT eval - SNF 9/1: patient refused SNF   Assessment & Plan:   Principal Problem:   Rhabdomyolysis Active Problems:   Paroxysmal atrial fibrillation with RVR (HCC)   Essential hypertension   Leukocytosis   Dyslipidemia   Dementia (HCC)   GERD without esophagitis   Depression   * Rhabdomyolysis - due to fall - continue hydration with IV normal saline. - monitor CK levels. improving - Pain management prn - PT recommends SNF but patient refusing   Paroxysmal atrial fibrillation with RVR (HCC) - not on anticoagulation likely for fall risk. - rate controlled - continue aspirin .   Leukocytosis - likely related to stress demargination. Now resolved - She has no current symptoms or signs of infectious etiology at this time. UA neg   Essential hypertension - norvasc    Dyslipidemia - hold statin therapy due to rhabdo   Depression - Continue Zoloft  and Wellbutrin XL.   GERD without esophagitis - continue PPI therapy.   Dementia (HCC) - continue Aricept .     DVT prophylaxis: Lovenox       Code Status: DNR Family Communication: None at bedside Disposition Plan: possible D/C tomorrow     Subjective:  Feeling some better, wants to discuss about SNF option with CM  Objective: Vitals:   06/06/24 0024 06/06/24 0417 06/06/24 0750 06/06/24 1701  BP: (!) 130/42 132/63 (!) 142/55 (!) 133/57  Pulse: 94 90 88 87  Resp: 18 18 16 19   Temp: 98.3 F (36.8 C) 98.4 F (36.9 C)  98.2 F (36.8 C) 98.6 F (37 C)  TempSrc:   Oral Oral  SpO2: 94% (!) 89% 91% 93%  Weight:      Height:        Intake/Output Summary (Last 24 hours) at 06/06/2024 1724 Last data filed at 06/06/2024 0900 Gross per 24 hour  Intake 2100.75 ml  Output --  Net 2100.75 ml   Filed Weights   06/04/24 1848  Weight: 90.7 kg    Examination:  General exam: Appears calm and comfortable  Respiratory system: Clear to auscultation. Respiratory effort normal. Cardiovascular system: S1 & S2 heard, RRR. No JVD, murmurs, rubs, gallops or clicks. No pedal edema. Gastrointestinal system: Abdomen is soft, benign Central nervous system: Alert and oriented. No focal neurological deficits. Extremities: Symmetric 5 x 5 power. Skin: No rashes, lesions or ulcers Psychiatry: Judgement and insight appear normal. Mood & affect appropriate.     Data Reviewed: I have personally reviewed following labs and imaging studies  CBC: Recent Labs  Lab 06/04/24 1928 06/05/24 0505 06/06/24 0501  WBC 17.1* 12.7* 5.7  NEUTROABS 15.6*  --   --   HGB 11.0* 9.1* 7.9*  HCT 35.1* 29.3* 25.3*  MCV 88.4 89.6 90.4  PLT 370 277 199   Basic Metabolic Panel: Recent Labs  Lab 06/04/24 1928 06/05/24 0505 06/06/24 0501  NA 138 139 138  K 3.8 3.8 3.7  CL 103 106 107  CO2 22 23 25   GLUCOSE 132*  117* 111*  BUN 28* 35* 27*  CREATININE 0.92 1.29* 1.22*  CALCIUM  9.1 8.5* 8.4*   GFR: Estimated Creatinine Clearance: 39.7 mL/min (A) (by C-G formula based on SCr of 1.22 mg/dL (H)). Liver Function Tests: Recent Labs  Lab 06/04/24 1928  AST 36  ALT 14  ALKPHOS 68  BILITOT 1.8*  PROT 7.8  ALBUMIN 4.0   No results for input(s): LIPASE, AMYLASE in the last 168 hours. No results for input(s): AMMONIA in the last 168 hours. Coagulation Profile: No results for input(s): INR, PROTIME in the last 168 hours. Cardiac Enzymes: Recent Labs  Lab 06/04/24 1928 06/05/24 0505  CKTOTAL 1,099* 892*       Radiology Studies: DG Lumbar Spine 2-3 Views Result Date: 06/04/2024 CLINICAL DATA:  Fall EXAM: LUMBAR SPINE - 2-3 VIEW COMPARISON:  CT chest abdomen and pelvis 05/28/2023 FINDINGS: The bones are diffusely osteopenic. There is no evidence of lumbar spine fracture. Alignment is normal. There is diffuse moderate to severe intervertebral disc space narrowing with endplate osteophyte formation compatible with degenerative change. There also degenerative changes of facet joints diffusely. There is mild levoconvex curvature of the mid lumbar spine. L3-L5 laminectomy defects are again noted. There are atherosclerotic calcifications of the aorta and cholecystectomy clips. IMPRESSION: 1. No acute fracture or malalignment. 2. Moderate to severe degenerative changes of the lumbar spine. Electronically Signed   By: Greig Pique M.D.   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 No evidence for hardware loosening or acute fracture. No joint effusion. Joint spaces are well maintained. 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 No acute findings. Electronically Signed   By: Greig Pique M.D.   On: 06/04/2024 20:50   DG Knee Complete 4 Views Left Result Date: 06/04/2024 EXAM: 4 or more VIEW(S) XRAY OF THE LEFT KNEE 06/04/2024 08:30:00 PM COMPARISON: 02/08/2021 CLINICAL HISTORY: Fall. Pt brought in by EMS from [facility] for a fall. FINDINGS: BONES AND JOINTS: Total knee arthroplasty in expected alignment without acute complication. No acute or periprosthetic fracture. Chronic ossific density posteriorly likely an intraarticular body. SOFT TISSUES: The soft tissues are unremarkable. IMPRESSION: 1. Left knee arthroplasty in expected alignment without complication. Electronically  signed by: Andrea Gasman MD 06/04/2024 08:49 PM EDT RP Workstation: [id number]   DG Chest 2 View Result Date: 06/04/2024 CLINICAL DATA:  Weakness EXAM: CHEST - 2 VIEW COMPARISON:  None Available. FINDINGS: Lungs are well expanded, symmetric, and clear. No pneumothorax or pleural effusion. Cardiac size within normal limits. Pulmonary vascularity is normal. Osseous structures are age-appropriate. No acute bone abnormality. IMPRESSION: 1. No active cardiopulmonary disease. Electronically Signed   By: Dorethia Molt M.D.   On: 06/04/2024 20:40   CT Cervical Spine Wo Contrast Result Date: 06/04/2024 EXAM: CT CERVICAL SPINE WITHOUT CONTRAST 06/04/2024 07:58:37 PM TECHNIQUE: CT of the cervical spine was performed without the administration of intravenous contrast. Multiplanar reformatted images are provided for review. Automated exposure control, iterative reconstruction, and/or weight based adjustment of the mA/kV was utilized to reduce the radiation dose to as low as reasonably achievable. COMPARISON: CT 8 days ago 05/28/23 CLINICAL HISTORY: Neck trauma (Age >= 65y). Pt brought in by EMS from [facility] for a fall. Per pt, she fell at about 3am when she went to the bathroom.  Found by family at 5pm today. Pt denies hitting her head or any loss of consciousness. FINDINGS: CERVICAL SPINE: BONES AND ALIGNMENT: No acute fracture or traumatic malalignment. DEGENERATIVE CHANGES: Stable diffuse degenerative disc disease and facet hypertrophy. SOFT TISSUES: No prevertebral soft tissue swelling. IMPRESSION: 1. No acute abnormality of the cervical spine related to the reported neck trauma. 2. Stable diffuse degenerative disc disease and facet hypertrophy. Electronically signed by: Andrea Gasman MD 06/04/2024 08:09 PM EDT RP Workstation: [id number]   CT Head Wo Contrast Result Date: 06/04/2024 EXAM: CT HEAD WITHOUT CONTRAST 06/04/2024 07:58:37 PM TECHNIQUE: CT of the head was performed without the  administration of intravenous contrast. Automated exposure control, iterative reconstruction, and/or weight based adjustment of the mA/kV was utilized to reduce the radiation dose to as low as reasonably achievable. COMPARISON: 05/28/2023 CLINICAL HISTORY: Head trauma, minor (Age >= 65y). Pt brought in by EMS from [facility] for a fall. Per pt, she fell at about 3am when she went to the bathroom. Found by family at 5pm today. FINDINGS: BRAIN AND VENTRICLES: No acute hemorrhage. No evidence of acute infarct. No hydrocephalus. No extra-axial collection. No mass effect or midline shift. Stable atrophy and chronic small vessel ischemia. Remote high right frontal infarct. Unchanged lacunar infarcts in the left cerebellum and basal ganglia infarcts. ORBITS: No acute abnormality. SINUSES: No acute abnormality. SOFT TISSUES AND SKULL: Left parietal scalp hematoma. No skull fracture. IMPRESSION: 1. No acute intracranial abnormality. 2. Stable atrophy and chronic small vessel ischemia. Electronically signed by: Andrea Gasman MD 06/04/2024 08:05 PM EDT RP Workstation: [id number]        Scheduled Meds:  amLODipine   5 mg Oral Daily   ascorbic acid   1,000 mg Oral Daily   aspirin  EC  81 mg Oral Daily   calcium  carbonate  1,250 mg Oral BID   cholecalciferol   500 Units Oral Daily   DULoxetine   30 mg Oral Daily   enoxaparin  (LOVENOX ) injection  0.5 mg/kg Subcutaneous QHS   multivitamin with minerals  1 tablet Oral Daily   vitamin B-12  100 mcg Oral Daily   Continuous Infusions:  sodium chloride  100 mL/hr at 06/06/24 1529     LOS: 1 day    Time spent: 35 mins    Tamsyn Owusu Maree, MD [facility] Pager [phone]  If 7PM-7AM, please contact night-coverage [email or web address]  06/06/2024, 5:24 PM

## 2024-06-06 NOTE — Plan of Care (Signed)

## 2024-06-06 NOTE — TOC Progression Note (Addendum)
 Transition of Care [facility]) - Progression Note    Patient Details  Name: Jaclyn Galvan MRN: [id number] Date of Birth: 11-28-1940  [facility]) CM/SW Contact  Marinda Cooks, RN Phone Number: 06/06/2024, 4:46 PM  Clinical Narrative:    This CM  confirmed with bedside RN Pt is A&Ox.4 and spoke with pt introduced role and discussed dc plan / dc recommendations. Pt  declined going to a SNF for rehab and expressed she would like to have Out Patient PT services coordinated at [facility] at [employer] informing she's had that before coordinated , This CM informed pt of HH option for PT at which time pt verbalized understanding and declined. Pt reports living in an Apt [facility] the [facility] with elevator access to reach her apt. . Pt has family support provided by her daughters. Pt shared her daughter [name]  lives in [place] & her daughter [name] lives in [place]. Pt uses  [facility] on [facility] pharmacy & PCP is listed as Dr. Ophelia Makua  and has DME  equipment in home that includes rollator , shower chair .Pt reports not  being in SNF prior to  this admission or having  HH services .Pt  confirms she attends community programs within [facility]. TOC will cont to follow pt dc planning / care coordination  during hospital stay and update as applicable. This CM called and confirmed with business office [facility] community does not have a SNF level of care  that residents fleeta transition too.     Expected Discharge Plan and Services  TBD       Social Drivers of Health (SDOH) Interventions SDOH Screenings   Food Insecurity: No Food Insecurity (06/05/2024)  Housing: Low Risk  (06/05/2024)  Transportation Needs: No Transportation Needs (06/05/2024)  Utilities: Not At Risk (06/05/2024)  Financial Resource Strain: Low Risk  (03/17/2024)   Received from [facility]  Social Connections: Moderately Integrated (06/05/2024)  Tobacco Use: Low Risk  (06/04/2024)    Readmission Risk  Interventions     No data to display

## 2024-06-06 NOTE — Progress Notes (Signed)
 Daily Progress Note   Date: 06/06/2024   Patient Name: Jaclyn Galvan  DOB: October 17, 1940  MRN: [id number]  Age / Sex: 84 y.o., female  Attending Physician: Maree Hue, MD Primary Care Physician: [name], MD Admit Date: 06/04/2024 Length of Stay: 1 day  Reason for Follow-up: Establishing goals of care  Past Medical History:  Diagnosis Date   Adenomatous colon polyp 07/31/2015   Anemia    resolved from the beginning of this year   Atrial fibrillation [facility])    cardioversion March 2004   Cholelithiasis    Chronic gastritis    CKD (chronic kidney disease)    chronic kidney disease stage III   Diverticulosis    DJD (degenerative joint disease)    B knees, B knee replacements, 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 Loreli    Subjective:   Subjective: Chart Reviewed. Updates received. Patient Assessed. Created space and opportunity for patient  and family to explore thoughts and feelings regarding current medical situation.  Today's Discussion: Today before meeting with the patient/family, I reviewed the chart notes including admission H&P, nursing notes from yesterday, internal medicine note from yesterday, nursing note from today. I also reviewed vital signs, nursing flowsheets, medication administrations record, labs, and imaging. Labs reviewed include CK from yesterday which showed a downward trend in the setting of rhabdomyolysis from 1099 2 days ago to 892 today.  No CK lab admin ordered yet for today.  CBC shows drop in hemoglobin to 7.9 toda no note of plan transfusion given hemoglobin  above 7.  BMP shows slight improvement in kidney function in the setting of rhabdo myelitis from 1.29 yesterday to 1.22 today, BUN improved from 35-27.  Today saw the patient the bedside, no family was present.  She awakens to voice, makes and keeps eye contact, is pleasantly conversational.  She does describe some neck pain but indicates that Tylenol  helps.  I offered to asked the nurse to provide a dose of Tylenol  and she agrees.  We discussed current plans and goals including DNR/DNI status, continued medical management, likely discharge to SNF/rehab.  Patient is in agreement and open to rehab.  After seeing the patient I reach out to her daughter and we discussed the patient's current status which is somewhat improved.  She provided substantial history including recent physical and mental decline over the past few months.  I also indicated that the patient seems open to rehab which is reassuring to the patient's daughter.  They stay with the patient or visit with the patient 3 times a week and they are planning to hire aides to be there for times a week.  They feel that if she can get stronger with rehab, between themselves and a nursing aide in the home they will try to get her up and remain mobile daily to prevent onward decline.  We also discussed, given recent mental status and functional decline, the utility of outpatient palliative medicine to continue  ongoing goals of care conversations as her health evolves.  Family is in agreement with this as well.  At this point goals seem clear including DNR/DNI, continue to treat the treatable, work towards discharge to SNF/rehab with outpatient palliative care to follow-up for clinical trajectory in the long-term.  I provided emotional and general support through therapeutic listening, empathy, sharing of stories, and other techniques. I answered all questions and addressed all concerns to the best of my ability.  Review of Systems  Constitutional:   Positive for fatigue.  Respiratory:  Negative for shortness of breath.   Cardiovascular:  Negative for chest pain.  Gastrointestinal:  Negative for abdominal pain, nausea and vomiting.  Musculoskeletal:        Neck pain    Objective:   Primary Diagnoses: Present on Admission:  Rhabdomyolysis   Vital Signs:  BP (!) 142/55 (BP Location: Right Arm)   Pulse 88   Temp 98.2 F (36.8 C) (Oral)   Resp 16   Ht 5' 6 (1.676 m)   Wt 90.7 kg   SpO2 91%   BMI 32.28 kg/m   Physical Exam Vitals and nursing note reviewed.  Constitutional:      General: She is not in acute distress.    Appearance: She is ill-appearing. She is not toxic-appearing.  HENT:     Head: Normocephalic and atraumatic.  Cardiovascular:     Rate and Rhythm: Normal rate.  Pulmonary:     Effort: Pulmonary effort is normal. No respiratory distress.  Abdominal:     General: Abdomen is flat. There is no distension.     Palpations: Abdomen is soft.  Skin:    General: Skin is warm and dry.  Neurological:     General: No focal deficit present.     Mental Status: She is alert.  Psychiatric:        Mood and Affect: Mood normal.        Behavior: Behavior normal.     Palliative Assessment/Data: 60-70%   Existing Vynca/ACP Documentation: None  Assessment & Plan:   HPI/Patient Profile:  83 y.o. female  with past medical history of CKD stage 3a, fibromyalgia, HTN, PAF not on AC, HLD, OSA, CVA, osteopenia, mild cognitive impairment, vocal cord dysfunction and vitamin B12 deficiency admitted from [facility] on 06/04/2024 s/p fall, found by son-in-law and neighbors, thought to have been down about 15 hours. Noted AMS and visual hallucinations when found on floor.   In ED, imaging negative for acute fractures, CK 1099, leukocytosis Admitted and bring treated for rhabdomyolysis, possible infection--urine culture pending   Palliative medicine was consulted for assisting with goals of care  conversations.  SUMMARY OF RECOMMENDATIONS   DNR-limited (DNR/DNI) Continue current scope of treatment Work toward discharge to SNF/rehab [facility] consult for outpatient palliative care Palliative medicine will follow-up in a couple days if patient remains admitted Please notify us  of any significant change or new palliative needs in the interim  Symptom Management:  Per primary team PMT is available to assist as needed  Code Status: DNR - Limited (DNR/DNI)  Prognosis: Unable to determine  Discharge Planning: Skilled Nursing Facility for rehab with Palliative care service follow-up  Discussed with: Patient, family, medical team, nursing team, [facility] team  Thank you for allowing us  to participate in the care of TAUSHA MILHOAN PMT will continue to support holistically.  Billing based on MDM: High  Problems Addressed: One acute or chronic illness or injury that poses a threat to  life or bodily function  Amount and/or Complexity of Data: Category 1:Review of prior external note(s) from each unique source, Review of the result(s) of each unique test, and Assessment requiring an independent historian(s) and Category 3:Discussion of management or test interpretation with external physician/other qualified health care professional/appropriate source (not separately reported)  Risks: N/A  Detailed review of medical records (labs, imaging, vital signs), medically appropriate exam, discussed with treatment team, counseling and education to patient, family, & staff, documenting clinical information, medication management, coordination of care  Camellia Kays, NP Palliative Medicine Team  Team Phone # [phone] (Nights/Weekends)  06/04/2021, 8:17 AM

## 2024-06-06 NOTE — Plan of Care (Signed)
  Problem: Education: Goal: Knowledge of General Education information will improve Description: Including pain rating scale, medication(s)/side effects and non-pharmacologic comfort measures Outcome: Progressing   Problem: Clinical Measurements: Goal: Will remain free from infection Outcome: Progressing   Problem: Activity: Goal: Risk for activity intolerance will decrease Outcome: Progressing   Problem: Nutrition: Goal: Adequate nutrition will be maintained Outcome: Progressing   Problem: Elimination: Goal: Will not experience complications related to urinary retention Outcome: Progressing   Problem: Pain Managment: Goal: General experience of comfort will improve and/or be controlled Outcome: Progressing   Problem: Safety: Goal: Ability to remain free from injury will improve Outcome: Progressing   Problem: Skin Integrity: Goal: Risk for impaired skin integrity will decrease Outcome: Progressing

## 2024-06-06 NOTE — Progress Notes (Addendum)
 Patient is refusing protonix . States that it makes her hands cramp. MD notified.

## 2024-06-07 ENCOUNTER — Other Ambulatory Visit: Payer: Self-pay

## 2024-06-07 DIAGNOSIS — M6282 Rhabdomyolysis: Secondary | ICD-10-CM | POA: Diagnosis not present

## 2024-06-07 DIAGNOSIS — D72829 Elevated white blood cell count, unspecified: Secondary | ICD-10-CM | POA: Diagnosis not present

## 2024-06-07 DIAGNOSIS — D638 Anemia in other chronic diseases classified elsewhere: Secondary | ICD-10-CM

## 2024-06-07 DIAGNOSIS — E785 Hyperlipidemia, unspecified: Secondary | ICD-10-CM | POA: Diagnosis not present

## 2024-06-07 LAB — BASIC METABOLIC PANEL WITH GFR
Anion gap: 7 (ref 5–15)
BUN: 18 mg/dL (ref 8–23)
CO2: 25 mmol/L (ref 22–32)
Calcium: 8.1 mg/dL — ABNORMAL LOW (ref 8.9–10.3)
Chloride: 108 mmol/L (ref 98–111)
Creatinine, Ser: 0.98 mg/dL (ref 0.44–1.00)
GFR, Estimated: 57 mL/min — ABNORMAL LOW (ref >60)
Glucose, Bld: 98 mg/dL (ref 70–99)
Potassium: 3.3 mmol/L — ABNORMAL LOW (ref 3.5–5.1)
Sodium: 140 mmol/L (ref 135–145)

## 2024-06-07 LAB — CBC
HCT: 23.4 % — ABNORMAL LOW (ref 36.0–46.0)
Hemoglobin: 7.3 g/dL — ABNORMAL LOW (ref 12.0–15.0)
MCH: 28.2 pg (ref 26.0–34.0)
MCHC: 31.2 g/dL (ref 30.0–36.0)
MCV: 90.3 fL (ref 80.0–100.0)
Platelets: 173 K/uL (ref 150–400)
RBC: 2.59 MIL/uL — ABNORMAL LOW (ref 3.87–5.11)
RDW: 15.1 % (ref 11.5–15.5)
WBC: 3.7 K/uL — ABNORMAL LOW (ref 4.0–10.5)
nRBC: 0 % (ref 0.0–0.2)

## 2024-06-07 LAB — CK: Total CK: 207 U/L (ref 38–234)

## 2024-06-07 LAB — ABO/RH: ABO/RH(D): O POS

## 2024-06-07 LAB — PREPARE RBC (CROSSMATCH)

## 2024-06-07 MED ORDER — ENOXAPARIN SODIUM 40 MG/0.4ML IJ SOSY: 40.0000 mg | PREFILLED_SYRINGE | INTRAMUSCULAR | Status: DC

## 2024-06-07 MED ORDER — HYDRALAZINE HCL 20 MG/ML IJ SOLN
5.0000 mg | Freq: Once | INTRAMUSCULAR | Status: AC
  Administered 2024-06-07: 5 mg via INTRAVENOUS
  Filled 2024-06-07: qty 1

## 2024-06-07 MED ORDER — SODIUM CHLORIDE 0.9% IV SOLUTION: Freq: Once | INTRAVENOUS | Status: AC

## 2024-06-07 MED ORDER — FAMOTIDINE 20 MG PO TABS
20.0000 mg | ORAL_TABLET | Freq: Every day | ORAL | Status: DC
  Administered 2024-06-07 – 2024-06-09 (×3): 20 mg via ORAL
  Filled 2024-06-07 (×3): qty 1

## 2024-06-07 MED ORDER — ASPIRIN 81 MG PO TBEC
81.0000 mg | DELAYED_RELEASE_TABLET | Freq: Every day | ORAL | Status: DC
  Administered 2024-06-07 – 2024-06-09 (×3): 81 mg via ORAL
  Filled 2024-06-07 (×3): qty 1

## 2024-06-07 MED ORDER — FAMOTIDINE 20 MG PO TABS
20.0000 mg | ORAL_TABLET | Freq: Every day | ORAL | 0 refills | Status: AC
  Filled 2024-06-07: qty 30, 30d supply, fill #0

## 2024-06-07 MED ORDER — ENOXAPARIN SODIUM 60 MG/0.6ML IJ SOSY
45.0000 mg | PREFILLED_SYRINGE | INTRAMUSCULAR | Status: DC
  Administered 2024-06-07 – 2024-06-08 (×2): 45 mg via SUBCUTANEOUS
  Filled 2024-06-07 (×2): qty 0.6

## 2024-06-07 MED ORDER — FUROSEMIDE 10 MG/ML IJ SOLN
80.0000 mg | INTRAMUSCULAR | Status: AC
  Administered 2024-06-07: 80 mg via INTRAVENOUS
  Filled 2024-06-07: qty 8

## 2024-06-07 MED ORDER — POTASSIUM CHLORIDE CRYS ER 20 MEQ PO TBCR
20.0000 meq | EXTENDED_RELEASE_TABLET | ORAL | Status: AC
  Administered 2024-06-07 (×3): 20 meq via ORAL
  Filled 2024-06-07 (×3): qty 1

## 2024-06-07 NOTE — Progress Notes (Signed)
 SATURATION QUALIFICATIONS: (This note is used to comply with regulatory documentation for home oxygen)  Patient Saturations on Room Air at Rest = 86%   Patient Saturations on 3 Liters of oxygen while at rest = 92%

## 2024-06-07 NOTE — Plan of Care (Signed)

## 2024-06-07 NOTE — Progress Notes (Signed)
 PHARMACIST - PHYSICIAN COMMUNICATION  CONCERNING:  Enoxaparin  (Lovenox ) for DVT Prophylaxis    RECOMMENDATION: Patient was prescribed enoxaprin 40mg  q24 hours for VTE prophylaxis.   Filed Weights   06/04/24 1848  Weight: 90.7 kg (200 lb)    Body mass index is 32.28 kg/m.  Estimated Creatinine Clearance: 49.4 mL/min (by C-G formula based on SCr of 0.98 mg/dL).   Based on [facility] policy patient is candidate for enoxaparin  0.5mg /kg TBW SQ every 24 hours based on BMI being >30.   DESCRIPTION: Pharmacy has adjusted enoxaparin  dose per [facility] policy.  Patient is now receiving enoxaparin  0.5 mg/kg every 24 hours    Adriana JONETTA Bolster, PharmD Clinical Pharmacist  06/07/2024 8:37 PM

## 2024-06-07 NOTE — Plan of Care (Signed)
 Patient seems to be sleepier today. Patient's has been on [place] 2L today for 86-90 oxygen sats on room air.   Problem: Education: Goal: Knowledge of General Education information will improve Description: Including pain rating scale, medication(s)/side effects and non-pharmacologic comfort measures Outcome: Progressing   Problem: Clinical Measurements: Goal: Ability to maintain clinical measurements within normal limits will improve Outcome: Progressing Goal: Will remain free from infection Outcome: Progressing Goal: Diagnostic test results will improve Outcome: Progressing Goal: Respiratory complications will improve Outcome: Progressing Goal: Cardiovascular complication will be avoided Outcome: Progressing   Problem: Coping: Goal: Level of anxiety will decrease Outcome: Progressing   Problem: Elimination: Goal: Will not experience complications related to bowel motility Outcome: Progressing Goal: Will not experience complications related to urinary retention Outcome: Progressing   Problem: Pain Managment: Goal: General experience of comfort will improve and/or be controlled Outcome: Progressing   Problem: Safety: Goal: Ability to remain free from injury will improve Outcome: Progressing   Problem: Skin Integrity: Goal: Risk for impaired skin integrity will decrease Outcome: Progressing   Problem: Health Behavior/Discharge Planning: Goal: Ability to manage health-related needs will improve Outcome: Not Progressing   Problem: Activity: Goal: Risk for activity intolerance will decrease Outcome: Not Progressing   Problem: Nutrition: Goal: Adequate nutrition will be maintained Outcome: Not Progressing

## 2024-06-07 NOTE — Consult Note (Signed)
 Pharmacy Consult Note - Electrolytes  Sodium (mmol/L)  Date Value  06/07/2024 140   Potassium (mmol/L)  Date Value  06/07/2024 3.3 (L)   Calcium  (mg/dL)  Date Value  90/97/7974 8.1 (L)   Albumin (g/dL)  Date Value  91/69/7974 4.0    ASSESSMENT: 83 y.o. female with PMH including CKD3, HTN, paroxysmal Afib who presents with rhabdomyolysis following a mechanical fall. Pharmacy has been consulted to monitor and replace electrolytes. Patient's renal function is currently back at baseline.  Lab Results  Component Value Date   CREATININE 0.98 06/07/2024   CREATININE 1.22 (H) 06/06/2024   CREATININE 1.29 (H) 06/05/2024    mIVF: NS @ 100 mL/hr  Pertinent medications: furosemide  80mg  IV x 1 scheduled for today  Goal of Therapy:  [x]  Electrolytes WNL []  K >=4.0 and Mg >=2.0   PLAN: K 3.3 >> potassium chloride  20 mEq PO x 3 No other electrolyte replacement currently warranted Check electrolytes including Mg and Phos with next AM labs   Thank you for allowing pharmacy to be a part of this patient's care.  Will M. Lenon, PharmD Clinical Pharmacist 06/07/2024 9:11 AM

## 2024-06-07 NOTE — Progress Notes (Signed)
 PROGRESS NOTE    Jaclyn Galvan  FMW:[id number] DOB: 10-Jan-1941 DOA: 06/04/2024 PCP: Jaclyn Ophelia JINNY DOUGLAS, MD   Brief Narrative:   83 y.o. Caucasian female with medical history significant for stage III chronic kidney disease, fibromyalgia, hypertension, paroxysmal atrial fibrillation, dyslipidemia, GAD, OSA and CVA, who presented to the ER with acute onset of fall.  The patient was leaving her bathroom on her way to bed at 3 AM this morning when she tripped on the doorway and fell.   8/31: PT, OT eval - SNF 9/1: patient refused SNF 9/2: 1 PRBC transfusion. Planned for DC home per patient request but later/after hrs she/daughter changed the mind and now requesting SNF   Assessment & Plan:   Principal Problem:   Rhabdomyolysis Active Problems:   Paroxysmal atrial fibrillation with RVR (HCC)   Essential hypertension   Leukocytosis   Dyslipidemia   Dementia (HCC)   GERD without esophagitis   Depression   * Rhabdomyolysis - due to fall - continue hydration with IV normal saline. - monitor CK levels. improving - Pain management prn - PT recommended SNF but patient refusing - after DC paperwork done - she changed the mind and now requesting SNF  Anemia of chronic dz or could be slow GI bleed Patient denies any melena or blood in the urine Hb 7.3 (was 11) 1 PRBC transfusion. Recheck CBC in am - outpt GI work up if patient agreeable   Paroxysmal atrial fibrillation with RVR (HCC) - not on anticoagulation likely for fall risk. - rate controlled - continue aspirin .   Leukocytosis - likely related to stress demargination. Now resolved - She has no current symptoms or signs of infectious etiology at this time. UA neg   Essential hypertension - norvasc    Dyslipidemia - hold statin therapy due to rhabdo   Depression - Continue Zoloft  and Wellbutrin XL.   GERD without esophagitis - continue PPI therapy.   Dementia (HCC) - continue Aricept .  Acute hypoxia Likely  volume overload. She required 2-3 liter O2 - blood transfusion and IVF volume likely contributing Lasix  80 mg IV once - Diuresed and she is on RA now    DVT prophylaxis: Lovenox       Code Status: DNR Family Communication: None at bedside. Updated daughter over the phone Disposition Plan:  D/C tomorrow     Subjective:  Wants to go home, agreeable with blood transfusion for low Hb and also feeling tired.   Objective: Vitals:   06/07/24 1430 06/07/24 1752 06/07/24 1859 06/07/24 1954  BP: (!) 125/58 (!) 180/64 136/66 (!) 117/47  Pulse: 79 65 71 77  Resp: 20 20  18   Temp: 98.2 F (36.8 C) 98.2 F (36.8 C)  98.1 F (36.7 C)  TempSrc: Oral Oral    SpO2: 96% 96%  94%  Weight:      Height:        Intake/Output Summary (Last 24 hours) at 06/07/2024 2030 Last data filed at 06/07/2024 1900 Gross per 24 hour  Intake 594.67 ml  Output --  Net 594.67 ml   Filed Weights   06/04/24 1848  Weight: 90.7 kg    Examination:  General exam: Appears calm and comfortable  Respiratory system: Clear to auscultation. Respiratory effort normal. Cardiovascular system: S1 & S2 heard, RRR. No JVD, murmurs, rubs, gallops or clicks. No pedal edema. Gastrointestinal system: Abdomen is soft, benign Central nervous system: Alert and oriented. No focal neurological deficits. Extremities: Symmetric 5 x 5 power. Skin: No rashes,  lesions or ulcers Psychiatry: Judgement and insight appear normal. Mood & affect appropriate.     Data Reviewed: I have personally reviewed following labs and imaging studies  CBC: Recent Labs  Lab 06/04/24 1928 06/05/24 0505 06/06/24 0501 06/07/24 0414  WBC 17.1* 12.7* 5.7 3.7*  NEUTROABS 15.6*  --   --   --   HGB 11.0* 9.1* 7.9* 7.3*  HCT 35.1* 29.3* 25.3* 23.4*  MCV 88.4 89.6 90.4 90.3  PLT 370 277 199 173   Basic Metabolic Panel: Recent Labs  Lab 06/04/24 1928 06/05/24 0505 06/06/24 0501 06/07/24 0414  NA 138 139 138 140  K 3.8 3.8 3.7 3.3*  CL  103 106 107 108  CO2 22 23 25 25   GLUCOSE 132* 117* 111* 98  BUN 28* 35* 27* 18  CREATININE 0.92 1.29* 1.22* 0.98  CALCIUM  9.1 8.5* 8.4* 8.1*   GFR: Estimated Creatinine Clearance: 49.4 mL/min (by C-G formula based on SCr of 0.98 mg/dL). Liver Function Tests: Recent Labs  Lab 06/04/24 1928  AST 36  ALT 14  ALKPHOS 68  BILITOT 1.8*  PROT 7.8  ALBUMIN 4.0   No results for input(s): LIPASE, AMYLASE in the last 168 hours. No results for input(s): AMMONIA in the last 168 hours. Coagulation Profile: No results for input(s): INR, PROTIME in the last 168 hours. Cardiac Enzymes: Recent Labs  Lab 06/04/24 1928 06/05/24 0505 06/07/24 0414  CKTOTAL 1,099* 892* 207      Radiology Studies: No results found.       Scheduled Meds:  amLODipine   5 mg Oral Daily   ascorbic acid   1,000 mg Oral Daily   calcium  carbonate  1,250 mg Oral BID   cholecalciferol   500 Units Oral Daily   DULoxetine   30 mg Oral Daily   famotidine   20 mg Oral Daily   multivitamin with minerals  1 tablet Oral Daily   vitamin B-12  100 mcg Oral Daily   Continuous Infusions:     LOS: 2 days    Time spent: 35 mins    Cresencio Fairly, MD [facility] Pager [phone]  If 7PM-7AM, please contact night-coverage [email or web address]  06/07/2024, 8:30 PM

## 2024-06-07 NOTE — Plan of Care (Signed)
  Problem: Clinical Measurements: Goal: Ability to maintain clinical measurements within normal limits will improve Outcome: Progressing   Problem: Nutrition: Goal: Adequate nutrition will be maintained Outcome: Progressing   Problem: Coping: Goal: Level of anxiety will decrease Outcome: Progressing   Problem: Skin Integrity: Goal: Risk for impaired skin integrity will decrease Outcome: Progressing

## 2024-06-07 NOTE — Progress Notes (Signed)
 Occupational Therapy Treatment Patient Details Name: LAGRETTA LOSEKE MRN: [id number] DOB: [birth year] Today's Date: 06/07/2024   History of present illness DELORA GRAVATT is a 83 y.o. Caucasian female with medical history significant for stage III chronic kidney disease, fibromyalgia, hypertension, paroxysmal atrial fibrillation, dyslipidemia, GAD, OSA and CVA, who presented to the ER with acute onset of fall.  The patient was leaving her bathroom on her way to bed at 3 AM this morning when she tripped on the doorway and fell.  She denies any headache or dizziness or blurred vision.  No presyncope or syncope.  She denies any head injuries.  She had a very hard time getting off of the ground.  She was eventually found by her staff at her independent living facility this evening.  She was having back pain and bilateral knee pain.  Per her family she seemed more confused than her usual.  She also reported visual hallucinations seeing things on the ceiling.  She denied any fever or chills.  No cough or wheezing or dyspnea.  No dysuria, oliguria or hematuria, urgency or frequency or flank pain.  No nausea or vomiting or abdominal pain.  No other bleeding diathesis.   OT comments  Pt seen for OT tx. Pt in recliner, lethargic but alerts to OT's voice. Pt agreeable to session. Denies complaints aside from being tired. Pt reports staff using Stedy lift to get her from the bed to the recliner ~2min ago. Pt attempted to stand from the recliner with MAX VC for sequencing to attempt to shift her hips forward, ultimately requiring MAX - TOTAL A to shift prior to standing. VC for hand placement on arm rests. Attempted twice and pt unable to clear buttocks from seat. Will require +2 assist to attempt again. RN notified. RN in for meds at end of session. BP 131/55, SpO2 94% on 3 down to 2L O2. Pt continues to benefit from skilled OT Services.       If plan is discharge home, recommend the following:  Two people to help  with walking and/or transfers;A lot of help with bathing/dressing/bathroom;Assistance with cooking/housework;Assist for transportation;Help with stairs or ramp for entrance;Direct supervision/assist for medications management   Equipment Recommendations  Other (comment) (defer)    Recommendations for Other Services      Precautions / Restrictions Precautions Precautions: Fall Recall of Precautions/Restrictions: Impaired Restrictions Weight Bearing Restrictions Per Provider Order: No       Mobility Bed Mobility               General bed mobility comments: NT, in recliner pre and post session    Transfers Overall transfer level: Needs assistance Equipment used: Rolling walker (2 wheels) Transfers: Sit to/from Stand Sit to Stand: Total assist           General transfer comment: ultimately unable to clear buttocks off seat and required TOTAL A to just shift hips forward in preparation     Balance Overall balance assessment: Needs assistance Sitting-balance support: Single extremity supported, Bilateral upper extremity supported, Feet supported Sitting balance-Leahy Scale: Poor Sitting balance - Comments: able to maintain static sitting with BUE s upport on the recliner briefly then posteriorly falls back against the recliner Postural control: Posterior lean   Standing balance-Leahy Scale: Zero                             ADL either performed or assessed with clinical judgement  ADL Overall ADL's : Needs assistance/impaired     Grooming: Sitting;Oral care;Set up;Supervision/safety Grooming Details (indicate cue type and reason): VC to initiate, set up assist for opening toothpaste, increased time to complete and VC to initiate and terminate task                                    Extremity/Trunk Assessment              Vision       Perception     Praxis     Communication Communication Communication: No apparent  difficulties   Cognition Arousal: Lethargic Behavior During Therapy: Flat affect Cognition: No family/caregiver present to determine baseline             OT - Cognition Comments: Pt lethargic, alerts to VC, slow processing                 Following commands: Impaired Following commands impaired: Follows one step commands with increased time, Follows multi-step commands with increased time      Cueing   Cueing Techniques: Verbal cues  Exercises      Shoulder Instructions       General Comments      Pertinent Vitals/ Pain       Pain Assessment Pain Assessment: Faces Faces Pain Scale: Hurts a little bit Pain Location: chest pain - brief and resolved quickly - reports it happens directly after eating Pain Descriptors / Indicators: Grimacing Pain Intervention(s): Limited activity within patient's tolerance, Monitored during session, Repositioned  Home Living                                          Prior Functioning/Environment              Frequency  Min 2X/week        Progress Toward Goals  OT Goals(current goals can now be found in the care plan section)  Progress towards OT goals: OT to reassess next treatment  Acute Rehab OT Goals Patient Stated Goal: would like to get my balance better so I do not fall as much OT Goal Formulation: With patient Time For Goal Achievement: 06/13/24 Potential to Achieve Goals: Fair  Plan      Co-evaluation                 AM-PAC OT 6 Clicks Daily Activity     Outcome Measure   Help from another person eating meals?: A Little Help from another person taking care of personal grooming?: A Little Help from another person toileting, which includes using toliet, bedpan, or urinal?: Total Help from another person bathing (including washing, rinsing, drying)?: A Lot Help from another person to put on and taking off regular upper body clothing?: A Lot Help from another person to put on  and taking off regular lower body clothing?: A Lot 6 Click Score: 13    End of Session Equipment Utilized During Treatment: Oxygen  OT Visit Diagnosis: Unsteadiness on feet (R26.81);Repeated falls (R29.6);Muscle weakness (generalized) (M62.81);History of falling (Z91.81)   Activity Tolerance Patient tolerated treatment well;Patient limited by lethargy   Patient Left in chair;with call bell/phone within reach;with chair alarm set;with nursing/sitter in room   Nurse Communication Mobility status        Time: 8891-8873 OT Time Calculation (  min): 18 min  Charges: OT General Charges $OT Visit: 1 Visit OT Treatments $Self Care/Home Management : 8-22 mins  Warren SAUNDERS., MPH, MS, OTR/L ascom [phone] 06/07/24, 12:01 PM

## 2024-06-08 DIAGNOSIS — Z7189 Other specified counseling: Secondary | ICD-10-CM | POA: Diagnosis not present

## 2024-06-08 DIAGNOSIS — T796XXA Traumatic ischemia of muscle, initial encounter: Secondary | ICD-10-CM | POA: Diagnosis not present

## 2024-06-08 DIAGNOSIS — Z515 Encounter for palliative care: Secondary | ICD-10-CM | POA: Diagnosis not present

## 2024-06-08 DIAGNOSIS — W19XXXA Unspecified fall, initial encounter: Secondary | ICD-10-CM | POA: Diagnosis not present

## 2024-06-08 LAB — CBC
HCT: 29.4 % — ABNORMAL LOW (ref 36.0–46.0)
Hemoglobin: 9.8 g/dL — ABNORMAL LOW (ref 12.0–15.0)
MCH: 29.5 pg (ref 26.0–34.0)
MCHC: 33.3 g/dL (ref 30.0–36.0)
MCV: 88.6 fL (ref 80.0–100.0)
Platelets: 223 K/uL (ref 150–400)
RBC: 3.32 MIL/uL — ABNORMAL LOW (ref 3.87–5.11)
RDW: 15 % (ref 11.5–15.5)
WBC: 4.3 K/uL (ref 4.0–10.5)
nRBC: 0 % (ref 0.0–0.2)

## 2024-06-08 LAB — BASIC METABOLIC PANEL WITH GFR
Anion gap: 10 (ref 5–15)
BUN: 17 mg/dL (ref 8–23)
CO2: 28 mmol/L (ref 22–32)
Calcium: 8.7 mg/dL — ABNORMAL LOW (ref 8.9–10.3)
Chloride: 101 mmol/L (ref 98–111)
Creatinine, Ser: 1 mg/dL (ref 0.44–1.00)
GFR, Estimated: 56 mL/min — ABNORMAL LOW (ref >60)
Glucose, Bld: 89 mg/dL (ref 70–99)
Potassium: 3.9 mmol/L (ref 3.5–5.1)
Sodium: 139 mmol/L (ref 135–145)

## 2024-06-08 LAB — PHOSPHORUS: Phosphorus: 3.2 mg/dL (ref 2.5–4.6)

## 2024-06-08 LAB — MAGNESIUM: Magnesium: 2.1 mg/dL (ref 1.7–2.4)

## 2024-06-08 NOTE — Consult Note (Signed)
 Pharmacy Consult Note - Electrolytes  Sodium (mmol/L)  Date Value  06/08/2024 139   Potassium (mmol/L)  Date Value  06/08/2024 3.9   Magnesium  (mg/dL)  Date Value  90/96/7974 2.1   Calcium  (mg/dL)  Date Value  90/96/7974 8.7 (L)   Albumin (g/dL)  Date Value  91/69/7974 4.0   Phosphorus (mg/dL)  Date Value  90/96/7974 3.2    ASSESSMENT: 83 y.o. female with PMH including CKD3, HTN, paroxysmal Afib who presents with rhabdomyolysis following a mechanical fall. Pharmacy has been consulted to monitor and replace electrolytes. Patient's renal function is currently back at baseline.  Lab Results  Component Value Date   CREATININE 1.00 06/08/2024   CREATININE 0.98 06/07/2024   CREATININE 1.22 (H) 06/06/2024    mIVF: NS @ 100 mL/hr  Pertinent medications: furosemide  80mg  IV x 1 scheduled for today  Goal of Therapy:  [x]  Electrolytes WNL []  K >=4.0 and Mg >=2.0   PLAN: Electrolytes currently within normal limits No electrolyte replacement currently warranted Check BMP with next AM labs   Thank you for allowing pharmacy to be a part of this patient's care.  Leonor Argyle, PharmD PGY1 06/08/2024 6:30 AM

## 2024-06-08 NOTE — Progress Notes (Signed)
 Physical Therapy Treatment Patient Details Name: Jaclyn Galvan MRN: [id number] DOB: 01-18-1941 Today's Date: 06/08/2024   History of Present Illness Jaclyn Galvan is a 83 y.o. Caucasian female with medical history significant for stage III chronic kidney disease, fibromyalgia, hypertension, paroxysmal atrial fibrillation, dyslipidemia, GAD, OSA and CVA, who presented to the ER with acute onset of fall.  The patient was leaving her bathroom on her way to bed at 3 AM this morning when she tripped on the doorway and fell.  She denies any headache or dizziness or blurred vision.  No presyncope or syncope.  She denies any head injuries.  She had a very hard time getting off of the ground.  She was eventually found by her staff at her independent living facility this evening.  She was having back pain and bilateral knee pain.  Per her family she seemed more confused than her usual.  She also reported visual hallucinations seeing things on the ceiling.  She denied any fever or chills.  No cough or wheezing or dyspnea.  No dysuria, oliguria or hematuria, urgency or frequency or flank pain.  No nausea or vomiting or abdominal pain.  No other bleeding diathesis.    PT Comments  Pt A&Ox4, agreeable to participate in PT treatment. Pt reported chest pain of 4/10 intensity at beginning of session, reported it as heartburn pain that improved by end of session. Pt was met supine in bed, able to partially initiate supine > sit transfer with increased time and cuing, ultimately required maxA at trunk/BLE to achieve sitting EOB. Stedy lift used for transfer to recliner, minAx2 to stand with stedy. Pt completed additional STS from recliner with RW and minAx2. Pt able to take a few short, shuffled steps forward/backward with RW and minAx2 for RW management. Pt was left seated in recliner with all needs within reach. The patient would benefit from further skilled PT intervention to continue to progress towards goals.      If  plan is discharge home, recommend the following: A lot of help with walking and/or transfers;A lot of help with bathing/dressing/bathroom;Assistance with cooking/housework;Assist for transportation;Help with stairs or ramp for entrance   Can travel by private vehicle     No  Equipment Recommendations  Other (comment) (TBD at next venue of care)    Recommendations for Other Services       Precautions / Restrictions Precautions Precautions: Fall Recall of Precautions/Restrictions: Impaired Restrictions Weight Bearing Restrictions Per Provider Order: No     Mobility  Bed Mobility Overal bed mobility: Needs Assistance Bed Mobility: Supine to Sit     Supine to sit: Max assist, HOB elevated, Used rails     General bed mobility comments: Pt partially initiated supine > sit, ultimately required maxA for trunk and BLE to achieve sitting EOB    Transfers Overall transfer level: Needs assistance Equipment used: Rolling walker (2 wheels) Transfers: Sit to/from Stand, Bed to chair/wheelchair/BSC Sit to Stand: Min assist, +2 physical assistance, +2 safety/equipment           General transfer comment: initial STS from EOB and transfer to recliner with Stedy. Additional STS from recliner with RW and minAx2 Transfer via Lift Equipment: Stedy  Ambulation/Gait Ambulation/Gait assistance: Min assist, +2 physical assistance, +2 safety/equipment Gait Distance (Feet): 3 Feet Assistive device: Rolling walker (2 wheels) Gait Pattern/deviations: Step-to pattern, Trunk flexed       General Gait Details: pt able to take a few shuffled steps forward/backward with RW and minAx2  for RW management.   Stairs             Wheelchair Mobility     Tilt Bed    Modified Rankin (Stroke Patients Only)       Balance Overall balance assessment: Needs assistance Sitting-balance support: Feet supported Sitting balance-Leahy Scale: Poor Sitting balance - Comments: pt with heavy  posterior lean in sitting without back support, initially required mod-maxA to maintain upright, progressively able to self-correct Postural control: Posterior lean Standing balance support: Bilateral upper extremity supported Standing balance-Leahy Scale: Fair Standing balance comment: heavy BUE support                            Communication Communication Communication: No apparent difficulties  Cognition Arousal: Alert Behavior During Therapy: Flat affect, WFL for tasks assessed/performed   PT - Cognitive impairments: No apparent impairments                       PT - Cognition Comments: A&Ox4 Following commands: Impaired Following commands impaired: Follows one step commands inconsistently, Follows one step commands with increased time    Cueing Cueing Techniques: Verbal cues, Tactile cues  Exercises Other Exercises Other Exercises: HR in low 90s after supine > sit; mid 80s at end of session. SpO2 remained within 92-95% throughout with pt on RA.    General Comments        Pertinent Vitals/Pain Pain Assessment Pain Assessment: 0-10 Pain Score: 4  Pain Location: chest pain- reported it as heartburn type pain 2/2 pt just eating breakfast Pain Intervention(s): Monitored during session, Repositioned    Home Living                          Prior Function            PT Goals (current goals can now be found in the care plan section) Progress towards PT goals: Progressing toward goals    Frequency    Min 3X/week      PT Plan      Co-evaluation              AM-PAC PT 6 Clicks Mobility   Outcome Measure  Help needed turning from your back to your side while in a flat bed without using bedrails?: A Lot Help needed moving from lying on your back to sitting on the side of a flat bed without using bedrails?: A Lot Help needed moving to and from a bed to a chair (including a wheelchair)?: A Lot Help needed standing up from a  chair using your arms (e.g., wheelchair or bedside chair)?: A Lot Help needed to walk in hospital room?: A Lot Help needed climbing 3-5 steps with a railing? : Total 6 Click Score: 11    End of Session Equipment Utilized During Treatment: Gait belt Activity Tolerance: Patient limited by fatigue Patient left: in chair;with call bell/phone within reach;with chair alarm set Nurse Communication: Mobility status PT Visit Diagnosis: Other abnormalities of gait and mobility (R26.89);Repeated falls (R29.6);Muscle weakness (generalized) (M62.81);Difficulty in walking, not elsewhere classified (R26.2)     Time: 9140-9076 PT Time Calculation (min) (ACUTE ONLY): 24 min  Charges:    $Therapeutic Activity: 23-37 mins PT General Charges $$ ACUTE PT VISIT: 1 Visit                     Cleburne Savini, SPT

## 2024-06-08 NOTE — Progress Notes (Signed)
 Daily Progress Note   Date: 06/08/2024   Patient Name: Jaclyn Galvan  DOB: May 26, 1941  MRN: [id number]  Age / Sex: 83 y.o., female  Attending Physician: Trudy Anthony CHRISTELLA, MD Primary Care Physician: [name], MD Admit Date: 06/04/2024 Length of Stay: 3 days  Reason for Follow-up: Establishing goals of care  Past Medical History:  Diagnosis Date   Adenomatous colon polyp 07/31/2015   Anemia    resolved from the beginning of this year   Atrial fibrillation [facility])    cardioversion March 2004   Cholelithiasis    Chronic gastritis    CKD (chronic kidney disease)    chronic kidney disease stage III   Diverticulosis    DJD (degenerative joint disease)    B knees, B knee replacements, 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 Loreli    Subjective:   Subjective: Chart Reviewed. Updates received. Patient Assessed. Created space and opportunity for patient  and family to explore thoughts and feelings regarding current medical situation.  Today's Discussion: Today before meeting with the patient/family, I reviewed the chart notes including pharmacy note from today, PT note from today, internal medicine note from today, TOC note from today.  I also reviewed vital signs, nursing flowsheets, medication administrations record, labs, and imaging. Labs reviewed include CK from yesterday which showed resolution of rhabdo myelitis with CK normalized at 207.  BMP today shows normalization of creatinine yesterday to 0.98 and stable today at 1.00 in the setting of rhabdomyelitis.  CBC shows stable white count  at 4.3 in the setting of UTI, stable/improved hemoglobin at 9.8 from 7.3 yesterday and 7.9 the day before.  Today saw the patient the bedside, her daughter from out of town was present.  The patient was sitting in a bedside chair, states she feels pretty good today.  She is complaining of some heartburn and states that she previously had taken Protonix  but this causes hand cramps so she is typically on Pepcid .  It appears she is currently on Pepcid  in the hospital as well and I encouraged her to continue this outpatient if it helps, which she states it does.  We discussed plans for discharging to SNF/rehab for strengthening to allow her to go back home in a more independent fashion.  She seems on board with this and daughter encourages this as well.  We also discussed the importance of nutrition as her lunch plate was in front of her.  We discussed how nutrition help support strengthening and independence and she seems to agree with this.  I shared the palliative medicine would likely back off as goals are clear and there is a good plan in place.  I encouraged him to reach out to us  for any significant changes or new needs while in the hospital.  I provided emotional and general support through therapeutic listening, empathy, sharing of stories, and other techniques. I answered all questions and addressed all concerns to the best of my  ability.  Review of Systems  Respiratory:  Negative for shortness of breath.   Cardiovascular:  Negative for chest pain.  Gastrointestinal:  Negative for abdominal pain, nausea and vomiting.       Reflux discomfort    Objective:   Primary Diagnoses: Present on Admission:  Rhabdomyolysis   Vital Signs:  BP (!) 146/56 (BP Location: Right Arm)   Pulse 76   Temp 98.9 F (37.2 C) (Oral)   Resp 18   Ht 5' 6 (1.676 m)   Wt 90.7 kg   SpO2 96%   BMI 32.28 kg/m   Physical Exam Vitals and nursing note reviewed.  Constitutional:      General: She is not in  acute distress.    Appearance: She is ill-appearing. She is not toxic-appearing.  HENT:     Head: Normocephalic and atraumatic.  Cardiovascular:     Rate and Rhythm: Normal rate.  Pulmonary:     Effort: Pulmonary effort is normal. No respiratory distress.  Abdominal:     General: Abdomen is flat. There is no distension.     Palpations: Abdomen is soft.  Skin:    General: Skin is warm and dry.  Neurological:     General: No focal deficit present.     Mental Status: She is alert.  Psychiatric:        Mood and Affect: Mood normal.        Behavior: Behavior normal.     Palliative Assessment/Data: 60-70%   Existing Vynca/ACP Documentation: None  Assessment & Plan:   HPI/Patient Profile:  83 y.o. female  with past medical history of CKD stage 3a, fibromyalgia, HTN, PAF not on AC, HLD, OSA, CVA, osteopenia, mild cognitive impairment, vocal cord dysfunction and vitamin B12 deficiency admitted from [facility] on 06/04/2024 s/p fall, found by son-in-law and neighbors, thought to have been down about 15 hours. Noted AMS and visual hallucinations when found on floor.   In ED, imaging negative for acute fractures, CK 1099, leukocytosis Admitted and bring treated for rhabdomyolysis, possible infection--urine culture pending   Palliative medicine was consulted for assisting with goals of care conversations.  SUMMARY OF RECOMMENDATIONS   DNR-limited (DNR/DNI) Continue current scope of treatment Work toward discharge to SNF/rehab Planned outpatient palliative care (previously consulted with TOC) Goals are clear, solid plan in place Palliative medicine will back off at this time Please notify us  of any significant change or new palliative needs  Symptom Management:  Per primary team PMT is available to assist as needed  Code Status: DNR - Limited (DNR/DNI)  Prognosis: Unable to determine  Discharge Planning: Skilled Nursing Facility for rehab with Palliative  care service follow-up  Discussed with: Patient, family, medical team, nursing team, [facility] team  Thank you for allowing us  to participate in the care of ZYLIE MUMAW PMT will continue to support holistically.  Billing based on MDM:  Moderate  Detailed review of medical records (labs, imaging, vital signs), medically appropriate exam, discussed with treatment team, counseling and education to patient, family, & staff, documenting clinical information, medication management, coordination of care  Camellia Kays, NP Palliative Medicine Team  Team Phone # [phone] (Nights/Weekends)  06/04/2021, 8:17 AM

## 2024-06-08 NOTE — [place] FL2 (Signed)
 [place]  MEDICAID FL2 LEVEL OF CARE FORM     IDENTIFICATION  Patient Name: Jaclyn Galvan Birthdate: 11-15-1940 Sex: female Admission Date (Current Location): 06/04/2024  [place] and [place] Number:  Chiropodist and Address:  [facility], [address], [place], [place] [address]      Provider Number: [id number]  Attending Physician Name and Address:  Trudy Anthony CHRISTELLA, MD  Relative Name and Phone Number:  Anner Shuck (Daughter)  [phone] (Mobile)    Current Level of Care: SNF Recommended Level of Care: Skilled Nursing Facility Prior Approval Number:    Date Approved/Denied:   PASRR Number: [id number]  Discharge Plan: SNF    Current Diagnoses: Patient Active Problem List   Diagnosis Date Noted   Rhabdomyolysis 06/04/2024   Paroxysmal atrial fibrillation with RVR (HCC) 06/04/2024   Essential hypertension 06/04/2024   Dyslipidemia 06/04/2024   Dementia (HCC) 06/04/2024   GERD without esophagitis 06/04/2024   Depression 06/04/2024   Leukocytosis 06/04/2024   Absolute anemia 06/26/2015   A-fib (HCC) 06/26/2015   Hepatic cyst 06/26/2015   BP (high blood pressure) 06/26/2015   HLD (hyperlipidemia) 06/26/2015   H/O: osteoarthritis 06/26/2015   Arthritis, degenerative 06/26/2015   Chronic kidney disease (CKD), stage III (moderate) (HCC) 06/26/2015   Osteopenia 06/26/2015   Obstructive apnea 04/19/2015    Orientation RESPIRATION BLADDER Height & Weight     Self, Place    Incontinent Weight: 90.7 kg Height:  5' 6 (167.6 cm)  BEHAVIORAL SYMPTOMS/MOOD NEUROLOGICAL BOWEL NUTRITION STATUS      Incontinent Diet (Heart)  AMBULATORY STATUS COMMUNICATION OF NEEDS Skin   Limited Assist Verbally                         Personal Care Assistance Level of Assistance  Bathing, Feeding, Dressing Bathing Assistance: Limited assistance Feeding assistance: Limited assistance Dressing Assistance: Limited assistance      Functional Limitations Info             SPECIAL CARE FACTORS FREQUENCY                       Contractures      Additional Factors Info  Allergies, Code Status Code Status Info: DNR Allergies Info: Cipro, Morphine  and Codeine, Protonix            Current Medications (06/08/2024):  This is the current hospital active medication list Current Facility-Administered Medications  Medication Dose Route Frequency Provider Last Rate Last Admin   acetaminophen  (TYLENOL ) tablet 650 mg  650 mg Oral Q6H PRN Mansy, Jan A, MD   650 mg at 06/06/24 1017   Or   acetaminophen  (TYLENOL ) suppository 650 mg  650 mg Rectal Q6H PRN Mansy, Jan A, MD       amLODipine  (NORVASC ) tablet 5 mg  5 mg Oral Daily Nazari, Walid A, RPH   5 mg at 06/08/24 9176   ascorbic acid  (VITAMIN C ) tablet 1,000 mg  1,000 mg Oral Daily Mansy, Jan A, MD   1,000 mg at 06/08/24 9176   aspirin  EC tablet 81 mg  81 mg Oral Daily Maree Hue, MD   81 mg at 06/08/24 9176   calcium  carbonate (OS-CAL - dosed in mg of elemental calcium ) tablet 1,250 mg  1,250 mg Oral BID Mansy, Jan A, MD   1,250 mg at 06/08/24 9176   cholecalciferol  (VITAMIN D3) 25 MCG (1000 UNIT) tablet 500 Units  500 Units Oral Daily Mansy,  Madison LABOR, MD   500 Units at 06/08/24 9176   DULoxetine  (CYMBALTA ) DR capsule 30 mg  30 mg Oral Daily Mansy, Jan A, MD   30 mg at 06/08/24 9176   enoxaparin  (LOVENOX ) injection 45 mg  45 mg Subcutaneous Q24H Grubb, Rodney D, RPH   45 mg at 06/07/24 2208   famotidine  (PEPCID ) tablet 20 mg  20 mg Oral Daily Shah, Vipul, MD   20 mg at 06/08/24 9176   magnesium  hydroxide (MILK OF MAGNESIA) suspension 30 mL  30 mL Oral Daily PRN Mansy, Jan A, MD       multivitamin with minerals tablet 1 tablet  1 tablet Oral Daily Mansy, Jan A, MD   1 tablet at 06/08/24 9176   ondansetron  (ZOFRAN ) tablet 4 mg  4 mg Oral Q6H PRN Mansy, Jan A, MD       Or   ondansetron  (ZOFRAN ) injection 4 mg  4 mg Intravenous Q6H PRN Mansy, Jan A, MD       traZODone   (DESYREL ) tablet 25 mg  25 mg Oral QHS PRN Mansy, Jan A, MD   25 mg at 06/06/24 2140   vitamin B-12 (CYANOCOBALAMIN ) tablet 100 mcg  100 mcg Oral Daily Mansy, Jan A, MD   100 mcg at 06/08/24 9176     Discharge Medications: Please see discharge summary for a list of discharge medications.  Relevant Imaging Results:  Relevant Lab Results:   Additional Information SSN [id number]  Dalia GORMAN Fuse, RN

## 2024-06-08 NOTE — TOC Progression Note (Signed)
 Transition of Care [facility]) - Progression Note    Patient Details  Name: Jaclyn Galvan MRN: [id number] Date of Birth: [birth year]  [facility] CM/SW Contact  Dalia GORMAN Fuse, RN Phone Number: 06/08/2024, 10:31 AM  Clinical Narrative:     TOC CM spoke with the patient and her daughter in the room. The patient would like to go to SNF. TOC offered choice. The patient would like to stay in [place], her first choice is [facility]. She is also open to [place], [place], and [facility]. FL2 completed and PASRR obtained from [place] MUST. FL2 sent to the facilities named above.   TOC will continue to follow                    Expected Discharge Plan and Services         Expected Discharge Date: 06/07/24                                     Social Drivers of Health (SDOH) Interventions SDOH Screenings   Food Insecurity: No Food Insecurity (06/05/2024)  Housing: Low Risk  (06/05/2024)  Transportation Needs: No Transportation Needs (06/05/2024)  Utilities: Not At Risk (06/05/2024)  Financial Resource Strain: Low Risk  (03/17/2024)   Received from [facility]  Social Connections: Moderately Integrated (06/05/2024)  Tobacco Use: Low Risk  (06/04/2024)    Readmission Risk Interventions     No data to display

## 2024-06-08 NOTE — Care Management Important Message (Signed)
 Important Message  Patient Details  Name: Jaclyn Galvan MRN: [id number] Date of Birth: Jan 25, 1941   Important Message Given:  Yes - Medicare IM     Saloni Lablanc W, CMA 06/08/2024, 11:26 AM

## 2024-06-08 NOTE — Progress Notes (Addendum)
 PROGRESS NOTE    Jaclyn Galvan  FMW:[id number] DOB: 02-23-1941 DOA: 06/04/2024 PCP: [name], MD   Assessment & Plan:   Principal Problem:   Rhabdomyolysis Active Problems:   Paroxysmal atrial fibrillation with RVR (HCC)   Essential hypertension   Leukocytosis   Dyslipidemia   Dementia (HCC)   GERD without esophagitis   Depression  Assessment and Plan: Rhabdomyolysis: secondary to fall. PT/OT recs SNF. Pt is now agreeable to SNF    ACD: s/p 1 unit of pRBC transfused so far. H&H are trending up. Will continue to monitor    PAF: w/ RVR. Not on anticoagulation secondary to high fall risk. Rate controlled currently    Leukocytosis: resolved   HTN: continue on home dose of amlodipine     HLD: holding home statin secondary to rhabdomyolysis    Depression: severity unknown. Continue on home dose of duloxetin   GERD: continue on PPI  Dementia: continue w/ supportive care    Acute hypoxia: likely from volume overload. Resolved      DVT prophylaxis: lovenox   Code Status: DNR Family Communication: discussed pt's care w/ pt's daughter, [name], and answered her questions  Disposition Plan: waiting on SNF placement   Level of care: Telemetry Medical  Status is: Inpatient Remains inpatient appropriate because: medically stable. Needs SNF placement     Consultants:    Procedures:   Antimicrobials:   Subjective: Pt c/o fatigue   Objective: Vitals:   06/07/24 1859 06/07/24 1954 06/08/24 0301 06/08/24 0800  BP: 136/66 (!) 117/47 (!) 159/64 (!) 146/56  Pulse: 71 77 78 76  Resp:  18 16 18   Temp:  98.1 F (36.7 C) 98.9 F (37.2 C) 98.9 F (37.2 C)  TempSrc:    Oral  SpO2:  94% 94% 96%  Weight:      Height:        Intake/Output Summary (Last 24 hours) at 06/08/2024 1014 Last data filed at 06/07/2024 1900 Gross per 24 hour  Intake 594.67 ml  Output --  Net 594.67 ml   Filed Weights   06/04/24 1848  Weight: 90.7 kg    Examination:  General  exam: Appears calm and comfortable  Respiratory system: Clear to auscultation. Respiratory effort normal. Cardiovascular system: S1 & S2 +. No rubs, gallops or clicks Gastrointestinal system: Abdomen is nondistended, soft and nontender.  Normal bowel sounds heard. Central nervous system: Alert and awake. Moves all extremities  Psychiatry: Judgement and insight appears at baseline. Flat mood and affec   Data Reviewed: I have personally reviewed following labs and imaging studies  CBC: Recent Labs  Lab 06/04/24 1928 06/05/24 0505 06/06/24 0501 06/07/24 0414 06/08/24 0501  WBC 17.1* 12.7* 5.7 3.7* 4.3  NEUTROABS 15.6*  --   --   --   --   HGB 11.0* 9.1* 7.9* 7.3* 9.8*  HCT 35.1* 29.3* 25.3* 23.4* 29.4*  MCV 88.4 89.6 90.4 90.3 88.6  PLT 370 277 199 173 223   Basic Metabolic Panel: Recent Labs  Lab 06/04/24 1928 06/05/24 0505 06/06/24 0501 06/07/24 0414 06/08/24 0501  NA 138 139 138 140 139  K 3.8 3.8 3.7 3.3* 3.9  CL 103 106 107 108 101  CO2 22 23 25 25 28   GLUCOSE 132* 117* 111* 98 89  BUN 28* 35* 27* 18 17  CREATININE 0.92 1.29* 1.22* 0.98 1.00  CALCIUM  9.1 8.5* 8.4* 8.1* 8.7*  MG  --   --   --   --  2.1  PHOS  --   --   --   --  3.2   GFR: Estimated Creatinine Clearance: 48.4 mL/min (by C-G formula based on SCr of 1 mg/dL). Liver Function Tests: Recent Labs  Lab 06/04/24 1928  AST 36  ALT 14  ALKPHOS 68  BILITOT 1.8*  PROT 7.8  ALBUMIN 4.0   No results for input(s): LIPASE, AMYLASE in the last 168 hours. No results for input(s): AMMONIA in the last 168 hours. Coagulation Profile: No results for input(s): INR, PROTIME in the last 168 hours. Cardiac Enzymes: Recent Labs  Lab 06/04/24 1928 06/05/24 0505 06/07/24 0414  CKTOTAL 1,099* 892* 207   BNP (last 3 results) No results for input(s): PROBNP in the last 8760 hours. HbA1C: No results for input(s): HGBA1C in the last 72 hours. CBG: No results for input(s): GLUCAP in the last 168  hours. Lipid Profile: No results for input(s): CHOL, HDL, LDLCALC, TRIG, CHOLHDL, LDLDIRECT in the last 72 hours. Thyroid Function Tests: No results for input(s): TSH, T4TOTAL, FREET4, T3FREE, THYROIDAB in the last 72 hours. Anemia Panel: No results for input(s): VITAMINB12, FOLATE, FERRITIN, TIBC, IRON, RETICCTPCT in the last 72 hours. Sepsis Labs: No results for input(s): PROCALCITON, LATICACIDVEN in the last 168 hours.  No results found for this or any previous visit (from the past 240 hours).       Radiology Studies: No results found.      Scheduled Meds:  amLODipine   5 mg Oral Daily   ascorbic acid   1,000 mg Oral Daily   aspirin  EC  81 mg Oral Daily   calcium  carbonate  1,250 mg Oral BID   cholecalciferol   500 Units Oral Daily   DULoxetine   30 mg Oral Daily   enoxaparin  (LOVENOX ) injection  45 mg Subcutaneous Q24H   famotidine   20 mg Oral Daily   multivitamin with minerals  1 tablet Oral Daily   vitamin B-12  100 mcg Oral Daily   Continuous Infusions:   LOS: 3 days      Anthony CHRISTELLA Pouch, MD [facility] Pager [phone]  If 7PM-7AM, please contact night-coverage [email or web address] 06/08/2024, 10:14 AM

## 2024-06-09 DIAGNOSIS — T796XXA Traumatic ischemia of muscle, initial encounter: Secondary | ICD-10-CM | POA: Diagnosis not present

## 2024-06-09 LAB — BASIC METABOLIC PANEL WITH GFR
Anion gap: 9 (ref 5–15)
BUN: 19 mg/dL (ref 8–23)
CO2: 29 mmol/L (ref 22–32)
Calcium: 9.4 mg/dL (ref 8.9–10.3)
Chloride: 100 mmol/L (ref 98–111)
Creatinine, Ser: 1.05 mg/dL — ABNORMAL HIGH (ref 0.44–1.00)
GFR, Estimated: 53 mL/min — ABNORMAL LOW (ref >60)
Glucose, Bld: 100 mg/dL — ABNORMAL HIGH (ref 70–99)
Potassium: 3.4 mmol/L — ABNORMAL LOW (ref 3.5–5.1)
Sodium: 138 mmol/L (ref 135–145)

## 2024-06-09 LAB — MAGNESIUM: Magnesium: 2.1 mg/dL (ref 1.7–2.4)

## 2024-06-09 LAB — PHOSPHORUS: Phosphorus: 4 mg/dL (ref 2.5–4.6)

## 2024-06-09 MED ORDER — POTASSIUM CHLORIDE CRYS ER 20 MEQ PO TBCR
20.0000 meq | EXTENDED_RELEASE_TABLET | Freq: Once | ORAL | Status: AC
  Administered 2024-06-09: 20 meq via ORAL
  Filled 2024-06-09: qty 1

## 2024-06-09 NOTE — Discharge Summary (Signed)
 Physician Discharge Summary  Jaclyn Galvan FMW:[id number] DOB: 12-28-1940 DOA: 06/04/2024  PCP: [name], MD  Admit date: 06/04/2024 Discharge date: 06/09/2024  Admitted From: home  Disposition:  SNF  Recommendations for Outpatient Follow-up:  Follow up with PCP in 1-2 weeks  Home Health: no  Equipment/Devices:  Discharge Condition: stable  CODE STATUS: DNR Diet recommendation: Heart Healthy  Brief/Interim Summary: HPI was taken from Dr. Lawence: Jaclyn Galvan is a 83 y.o. Caucasian female with medical history significant for stage III chronic kidney disease, fibromyalgia, hypertension, paroxysmal atrial fibrillation, dyslipidemia, GAD, OSA and CVA, who presented to the ER with acute onset of fall.  The patient was leaving her bathroom on her way to bed at 3 AM this morning when she tripped on the doorway and fell.  She denies any headache or dizziness or blurred vision.  No presyncope or syncope.  She denies any head injuries.  She had a very hard time getting off of the ground.  She was eventually found by her staff at her independent living facility this evening.  She was having back pain and bilateral knee pain.  Per her family she seemed more confused than her usual.  She also reported visual hallucinations seeing things on the ceiling.  She denied any fever or chills.  No cough or wheezing or dyspnea.  No dysuria, oliguria or hematuria, urgency or frequency or flank pain.  No nausea or vomiting or abdominal pain.  No other bleeding diathesis.   ED Course: Upon presentation to the emergency room, BP was 145/84 with a heart rate 101 and respiratory to 26 and otherwise normal vital signs.  Labs revealed a BUN of 28.0 glucose of 132 with total bili of 1.8 and total CK was 1099.  CBC showed leukocytosis 17.1 and mild anemia with hemoglobin 11 hematocrit 35.1 compared to 13.1 and 40.1 on 05/28/2023. EKG as reviewed by me : EKG showed atrial fibrillation with a rate of 102 with PVCs and  nonspecific intraventricular conduction delay. Imaging: 2 view chest x-ray showed no acute cardiopulmonary disease.  Bilateral knee x-ray showed herr knee arthroplasty with expected alignment without complications.  Lumbar spine x-ray showed moderate to severe degenerative changes of the lumbar spine with no acute fracture or malalignment.   The patient was given 4 mg of IV Zofran  and 2 mg of IV morphine  sulfate, as well as 1 L bolus of IV lactated ringer .  She will be admitted to an observation medical telemetry bed for further evaluation and management.    Discharge Diagnoses:  Principal Problem:   Rhabdomyolysis Active Problems:   Paroxysmal atrial fibrillation with RVR (HCC)   Essential hypertension   Leukocytosis   Dyslipidemia   Dementia (HCC)   GERD without esophagitis   Depression Rhabdomyolysis: secondary to fall. PT/OT recs SNF. Pt is now agreeable to SNF    ACD: s/p 1 unit of pRBC transfused so far. H&H are trending up. Will continue to monitor    PAF: w/ RVR. Not on anticoagulation secondary to high fall risk. Rate controlled currently    Leukocytosis: resolved   HTN: continue on home dose of amlodipine     HLD: holding home statin secondary to rhabdomyolysis    Depression: severity unknown. Continue on home dose of duloxetin   GERD: continue on PPI  Dementia: continue w/ supportive care    Acute hypoxia: likely from volume overload. Resolved    Discharge Instructions  Discharge Instructions     Amb Referral to  Palliative Care   Complete by: As directed    Diet - low sodium heart healthy   Complete by: As directed    Increase activity slowly   Complete by: As directed    No wound care   Complete by: As directed    No wound care   Complete by: As directed       Allergies as of 06/09/2024       Reactions   Ciprofloxacin Nausea And Vomiting   Morphine  And Codeine Nausea And Vomiting   Pantoprazole  Other (See Comments)   Muscle spasms         Medication List     PAUSE taking these medications    atorvastatin  10 MG tablet Wait to take this until your doctor or other care provider tells you to start again. Commonly known as: LIPITOR Take 5 mg by mouth daily.       STOP taking these medications    pantoprazole  20 MG tablet Commonly known as: PROTONIX    UNABLE TO FIND       TAKE these medications    amLODipine  5 MG tablet Commonly known as: NORVASC  Take 5 mg by mouth daily.   ascorbic acid  1000 MG tablet Commonly known as: VITAMIN C  Take 1,000 mg by mouth daily.   aspirin  EC 81 MG tablet Take 81 mg by mouth daily.   aspirin -acetaminophen -caffeine 250-250-65 MG tablet Commonly known as: EXCEDRIN MIGRAINE Take by mouth every 6 (six) hours as needed for headache.   calcium  carbonate 1250 MG capsule Take 1,250 mg by mouth 2 (two) times daily with a meal.   cholecalciferol  10 MCG (400 UNIT) Tabs tablet Commonly known as: VITAMIN D3 Take 400 Units by mouth.   co-enzyme Q-10 30 MG capsule Take 30 mg by mouth daily.   donepezil  5 MG tablet Commonly known as: ARICEPT  Take 5 mg by mouth at bedtime.   DULoxetine  20 MG capsule Commonly known as: CYMBALTA  Take 30 mg by mouth daily.   Estroven Menopause Relief Caps Take by mouth at bedtime.   ESTROVEN PMS PO Take by mouth.   famotidine  20 MG tablet Commonly known as: PEPCID  Take 1 tablet (20 mg total) by mouth daily.   losartan  100 MG tablet Commonly known as: COZAAR  Take 100 mg by mouth daily.   multivitamin tablet Take 1 tablet by mouth daily.   sertraline  25 MG tablet Commonly known as: ZOLOFT  Take 25 mg by mouth daily. 2 tablets everay am.   torsemide 20 MG tablet Commonly known as: DEMADEX Take 20 mg by mouth every other day.   vitamin B-12 100 MCG tablet Commonly known as: CYANOCOBALAMIN  Take 100 mcg by mouth daily.   Wixela Inhub 250-50 MCG/ACT Aepb Generic drug: fluticasone-salmeterol Inhale 1 puff into the lungs in the  morning and at bedtime.        Follow-up Information     Fernande Ophelia PARAS III, MD. Schedule an appointment as soon as possible for a visit in 1 week(s).   Specialty: Internal Medicine Why: hospital follow up, [facility] Discharge F/UP Contact information: [address] [facility] [place] [address] [phone]                Allergies  Allergen Reactions   Ciprofloxacin Nausea And Vomiting   Morphine  And Codeine Nausea And Vomiting   Pantoprazole  Other (See Comments)    Muscle spasms    Consultations: Palliative care   Procedures/Studies: DG Lumbar Spine 2-3 Views Result Date: 06/04/2024  CLINICAL DATA:  Fall EXAM: LUMBAR SPINE - 2-3 VIEW COMPARISON:  CT chest abdomen and pelvis 05/28/2023 FINDINGS: The bones are diffusely osteopenic. There is no evidence of lumbar spine fracture. Alignment is normal. There is diffuse moderate to severe intervertebral disc space narrowing with endplate osteophyte formation compatible with degenerative change. There also degenerative changes of facet joints diffusely. There is mild levoconvex curvature of the mid lumbar spine. L3-L5 laminectomy defects are again noted. There are atherosclerotic calcifications of the aorta and cholecystectomy clips. IMPRESSION: 1. No acute fracture or malalignment. 2. Moderate to severe degenerative changes of the lumbar spine. Electronically Signed   By: Greig Pique M.D.   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 No evidence for hardware loosening or acute fracture. No joint effusion. Joint spaces are well maintained. 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 No acute findings. Electronically Signed   By: Greig Pique M.D.   On: 06/04/2024 20:50   DG Knee Complete 4  Views Left Result Date: 06/04/2024 EXAM: 4 or more VIEW(S) XRAY OF THE LEFT KNEE 06/04/2024 08:30:00 PM COMPARISON: 02/08/2021 CLINICAL HISTORY: Fall. Pt brought in by EMS from [facility] for a fall. FINDINGS: BONES AND JOINTS: Total knee arthroplasty in expected alignment without acute complication. No acute or periprosthetic fracture. Chronic ossific density posteriorly likely an intraarticular body. SOFT TISSUES: The soft tissues are unremarkable. IMPRESSION: 1. Left knee arthroplasty in expected alignment without complication. Electronically signed by: Andrea Gasman MD 06/04/2024 08:49 PM EDT RP Workstation: [id number]   DG Chest 2 View Result Date: 06/04/2024 CLINICAL DATA:  Weakness EXAM: CHEST - 2 VIEW COMPARISON:  None Available. FINDINGS: Lungs are well expanded, symmetric, and clear. No pneumothorax or pleural effusion. Cardiac size within normal limits. Pulmonary vascularity is normal. Osseous structures are age-appropriate. No acute bone abnormality. IMPRESSION: 1. No active cardiopulmonary disease. Electronically Signed   By: Dorethia Molt M.D.   On: 06/04/2024 20:40   CT Cervical Spine Wo Contrast Result Date: 06/04/2024 EXAM: CT CERVICAL SPINE WITHOUT CONTRAST 06/04/2024 07:58:37 PM TECHNIQUE: CT of the cervical spine was performed without the administration of intravenous contrast. Multiplanar reformatted images are provided for review. Automated exposure control, iterative reconstruction, and/or weight based adjustment of the mA/kV was utilized to reduce the radiation dose to as low as reasonably achievable. COMPARISON: CT 8 days ago 05/28/23 CLINICAL HISTORY: Neck trauma (Age >= 65y). Pt brought in by EMS from [facility] for a fall. Per pt, she fell at about 3am when she went to the bathroom. Found by family at 5pm today. Pt denies hitting her head or any loss of consciousness. FINDINGS: CERVICAL SPINE: BONES AND ALIGNMENT: No acute fracture or  traumatic malalignment. DEGENERATIVE CHANGES: Stable diffuse degenerative disc disease and facet hypertrophy. SOFT TISSUES: No prevertebral soft tissue swelling. IMPRESSION: 1. No acute abnormality of the cervical spine related to the reported neck trauma. 2. Stable diffuse degenerative disc disease and facet hypertrophy. Electronically signed by: Andrea Gasman MD 06/04/2024 08:09 PM EDT RP Workstation: [id number]   CT Head Wo Contrast Result Date: 06/04/2024 EXAM: CT HEAD WITHOUT CONTRAST 06/04/2024 07:58:37 PM TECHNIQUE: CT of the head was performed without the administration of intravenous contrast. Automated exposure control, iterative reconstruction, and/or weight based adjustment of the mA/kV was utilized to reduce  the radiation dose to as low as reasonably achievable. COMPARISON: 05/28/2023 CLINICAL HISTORY: Head trauma, minor (Age >= 65y). Pt brought in by EMS from [facility] for a fall. Per pt, she fell at about 3am when she went to the bathroom. Found by family at 5pm today. FINDINGS: BRAIN AND VENTRICLES: No acute hemorrhage. No evidence of acute infarct. No hydrocephalus. No extra-axial collection. No mass effect or midline shift. Stable atrophy and chronic small vessel ischemia. Remote high right frontal infarct. Unchanged lacunar infarcts in the left cerebellum and basal ganglia infarcts. ORBITS: No acute abnormality. SINUSES: No acute abnormality. SOFT TISSUES AND SKULL: Left parietal scalp hematoma. No skull fracture. IMPRESSION: 1. No acute intracranial abnormality. 2. Stable atrophy and chronic small vessel ischemia. Electronically signed by: Andrea Gasman MD 06/04/2024 08:05 PM EDT RP Workstation: [id number]   (Echo, Carotid, EGD, Colonoscopy, ERCP)    Subjective: Pt    Discharge Exam: Vitals:   06/09/24 0357 06/09/24 0753  BP: 138/62 137/88  Pulse:  84  Resp: 17 19  Temp: 97.7 F (36.5 C) 98 F (36.7 C)  SpO2: 92% 96%   Vitals:   06/08/24 1550  06/08/24 1948 06/09/24 0357 06/09/24 0753  BP: (!) 134/59 (!) 134/57 138/62 137/88  Pulse: 69 71  84  Resp: 18 18 17 19   Temp: 97.8 F (36.6 C) 97.9 F (36.6 C) 97.7 F (36.5 C) 98 F (36.7 C)  TempSrc: Oral     SpO2: 96% 97% 92% 96%  Weight:      Height:        General: Pt is alert, awake, not in acute distress Cardiovascular: S1/S2 +, no rubs, no gallops Respiratory: CTA bilaterally, no wheezing, no rhonchi Abdominal: Soft, NT, obese, bowel sounds + Extremities: no cyanosis    The results of significant diagnostics from this hospitalization (including imaging, microbiology, ancillary and laboratory) are listed below for reference.     Microbiology: No results found for this or any previous visit (from the past 240 hours).   Labs: BNP (last 3 results) No results for input(s): BNP in the last 8760 hours. Basic Metabolic Panel: Recent Labs  Lab 06/05/24 0505 06/06/24 0501 06/07/24 0414 06/08/24 0501 06/09/24 0540  NA 139 138 140 139 138  K 3.8 3.7 3.3* 3.9 3.4*  CL 106 107 108 101 100  CO2 23 25 25 28 29   GLUCOSE 117* 111* 98 89 100*  BUN 35* 27* 18 17 19   CREATININE 1.29* 1.22* 0.98 1.00 1.05*  CALCIUM  8.5* 8.4* 8.1* 8.7* 9.4  MG  --   --   --  2.1 2.1  PHOS  --   --   --  3.2 4.0   Liver Function Tests: Recent Labs  Lab 06/04/24 1928  AST 36  ALT 14  ALKPHOS 68  BILITOT 1.8*  PROT 7.8  ALBUMIN 4.0   No results for input(s): LIPASE, AMYLASE in the last 168 hours. No results for input(s): AMMONIA in the last 168 hours. CBC: Recent Labs  Lab 06/04/24 1928 06/05/24 0505 06/06/24 0501 06/07/24 0414 06/08/24 0501  WBC 17.1* 12.7* 5.7 3.7* 4.3  NEUTROABS 15.6*  --   --   --   --   HGB 11.0* 9.1* 7.9* 7.3* 9.8*  HCT 35.1* 29.3* 25.3* 23.4* 29.4*  MCV 88.4 89.6 90.4 90.3 88.6  PLT 370 277 199 173 223   Cardiac Enzymes: Recent Labs  Lab 06/04/24 1928 06/05/24 0505 06/07/24 0414  CKTOTAL 1,099* 892* 207  BNP: Invalid input(s):  POCBNP CBG: No results for input(s): GLUCAP in the last 168 hours. D-Dimer No results for input(s): DDIMER in the last 72 hours. Hgb A1c No results for input(s): HGBA1C in the last 72 hours. Lipid Profile No results for input(s): CHOL, HDL, LDLCALC, TRIG, CHOLHDL, LDLDIRECT in the last 72 hours. Thyroid function studies No results for input(s): TSH, T4TOTAL, T3FREE, THYROIDAB in the last 72 hours.  Invalid input(s): FREET3 Anemia work up No results for input(s): VITAMINB12, FOLATE, FERRITIN, TIBC, IRON, RETICCTPCT in the last 72 hours. Urinalysis    Component Value Date/Time   COLORURINE YELLOW (A) 06/05/2024 1357   APPEARANCEUR CLEAR (A) 06/05/2024 1357   LABSPEC 1.016 06/05/2024 1357   PHURINE 6.0 06/05/2024 1357   GLUCOSEU 50 (A) 06/05/2024 1357   HGBUR NEGATIVE 06/05/2024 1357   BILIRUBINUR NEGATIVE 06/05/2024 1357   KETONESUR 5 (A) 06/05/2024 1357   PROTEINUR 30 (A) 06/05/2024 1357   NITRITE NEGATIVE 06/05/2024 1357   LEUKOCYTESUR NEGATIVE 06/05/2024 1357   Sepsis Labs Recent Labs  Lab 06/05/24 0505 06/06/24 0501 06/07/24 0414 06/08/24 0501  WBC 12.7* 5.7 3.7* 4.3   Microbiology No results found for this or any previous visit (from the past 240 hours).   Time coordinating discharge: 33 mins  SIGNED:   Anthony CHRISTELLA Pouch, MD  [facility] 06/09/2024, 1:55 PM Pager   If 7PM-7AM, please contact night-coverage [email or web address]

## 2024-06-09 NOTE — Progress Notes (Signed)
 Physical Therapy Treatment Patient Details Name: Jaclyn Galvan MRN: [id number] DOB: [birth year] Today's Date: 06/09/2024   History of Present Illness Jaclyn Galvan is a 83 y.o. Caucasian female with medical history significant for stage III chronic kidney disease, fibromyalgia, hypertension, paroxysmal atrial fibrillation, dyslipidemia, GAD, OSA and CVA, who presented to the ER with acute onset of fall.  The patient was leaving her bathroom on her way to bed at 3 AM this morning when she tripped on the doorway and fell.  She denies any headache or dizziness or blurred vision.  No presyncope or syncope.  She denies any head injuries.  She had a very hard time getting off of the ground.  She was eventually found by her staff at her independent living facility this evening.  She was having back pain and bilateral knee pain.  Per her family she seemed more confused than her usual.  She also reported visual hallucinations seeing things on the ceiling.  She denied any fever or chills.  No cough or wheezing or dyspnea.  No dysuria, oliguria or hematuria, urgency or frequency or flank pain.  No nausea or vomiting or abdominal pain.  No other bleeding diathesis.    PT Comments  Patient seen in conjunction with OT to maximize patient's tolerance. Asleep on arrival but awakens to voice. Required modA for supine>sit and easily distracted during movement requiring cues for initiation and continuation. Demonstrated poor sitting balance with heavy posterior lean requiring assist and frequent cues to maintain balance. Stood initially from EOB with modA+2 and RW with heavy L lateral and posterior lean in standing requiring up to maxA+2 to maintain upright. Attempted a second time to stand along with transfer to recliner with patient able to stand with modA+2 but no posterior or L lateral lean noted and able to take steps towards recliner with minA+1. Patient performed better with specific goal of mobility. Discharge plan  remains appropriate.     If plan is discharge home, recommend the following: A lot of help with walking and/or transfers;A lot of help with bathing/dressing/bathroom;Assistance with cooking/housework;Assist for transportation;Help with stairs or ramp for entrance   Can travel by private vehicle     No  Equipment Recommendations  Other (comment) (TBD)    Recommendations for Other Services       Precautions / Restrictions Precautions Precautions: Fall Recall of Precautions/Restrictions: Impaired Restrictions Weight Bearing Restrictions Per Provider Order: No     Mobility  Bed Mobility Overal bed mobility: Needs Assistance Bed Mobility: Supine to Sit     Supine to sit: Mod assist, HOB elevated, Used rails     General bed mobility comments: difficulty sequencing, requiring VC for hand rail assist and VC to continue through task    Transfers Overall transfer level: Needs assistance Equipment used: Rolling M[place]lee Dunk (2 wheels) Transfers: Sit to/from Stand, Bed to chair/wheelchair/BSC Sit to Stand: Mod assist, +2 physical assistance   Step pivot transfers: Min assist, +2 safety/equipment            Ambulation/Gait                   Stairs             Wheelchair Mobility     Tilt Bed    Modified Rankin (Stroke Patients Only)       Balance Overall balance assessment: Needs assistance Sitting-balance support: Feet supported, Bilateral upper extremity supported, Single extremity supported Sitting balance-Leahy Scale: Poor Sitting balance - Comments: pt with  heavy posterior and L lateral lean in sitting without back support, initially required mod-maxA to maintain upright, progressively able to self-correct with VC Postural control: Posterior lean, Left lateral lean Standing balance support: Bilateral upper extremity supported, Reliant on assistive device for balance Standing balance-Leahy Scale: Poor Standing balance comment: heavy BUE support; L  lateral lean on first attempt in standing requiring maxA to correct                            Communication Communication Communication: No apparent difficulties  Cognition Arousal: Alert, Lethargic Behavior During Therapy: Flat affect   PT - Cognitive impairments: Problem solving, Safety/Judgement, Attention, Initiation                       PT - Cognition Comments: poor initiation and problem solving. Mobilized better with specific goal to transfer to recliner Following commands: Impaired Following commands impaired: Follows one step commands inconsistently, Follows one step commands with increased time    Cueing Cueing Techniques: Verbal cues, Tactile cues, Gestural cues, Visual cues  Exercises      General Comments        Pertinent Vitals/Pain Pain Assessment Pain Assessment: No/denies pain    Home Living                          Prior Function            PT Goals (current goals can now be found in the care plan section) Acute Rehab PT Goals PT Goal Formulation: With patient Time For Goal Achievement: 06/19/24 Potential to Achieve Goals: Good Progress towards PT goals: Progressing toward goals    Frequency    Min 2X/week      PT Plan      Co-evaluation PT/OT/SLP Co-Evaluation/Treatment: Yes Reason for Co-Treatment: To address functional/ADL transfers;For patient/therapist safety;Complexity of the patient's impairments (multi-system involvement) PT goals addressed during session: Mobility/safety with mobility;Balance;Proper use of DME OT goals addressed during session: ADL's and self-care;Proper use of Adaptive equipment and DME      AM-PAC PT 6 Clicks Mobility   Outcome Measure  Help needed turning from your back to your side while in a flat bed without using bedrails?: A Lot Help needed moving from lying on your back to sitting on the side of a flat bed without using bedrails?: A Lot Help needed moving to and from  a bed to a chair (including a wheelchair)?: A Lot Help needed standing up from a chair using your arms (e.g., wheelchair or bedside chair)?: A Lot Help needed to walk in hospital room?: A Lot Help needed climbing 3-5 steps with a railing? : Total 6 Click Score: 11    End of Session Equipment Utilized During Treatment: Gait belt Activity Tolerance: Patient tolerated treatment well Patient left: in chair;with call bell/phone within reach;with chair alarm set Nurse Communication: Mobility status PT Visit Diagnosis: Other abnormalities of gait and mobility (R26.89);Repeated falls (R29.6);Muscle weakness (generalized) (M62.81);Difficulty in walking, not elsewhere classified (R26.2)     Time: 9053-8990 PT Time Calculation (min) (ACUTE ONLY): 23 min  Charges:    $Therapeutic Activity: 8-22 mins PT General Charges $$ ACUTE PT VISIT: 1 Visit                     Maryanne Finder, PT, DPT Physical Therapist - [place]  [facility]  A Hassen Bruun 06/09/2024, 1:26 PM

## 2024-06-09 NOTE — Consult Note (Signed)
 Pharmacy Consult Note - Electrolytes  Sodium (mmol/L)  Date Value  06/09/2024 138   Potassium (mmol/L)  Date Value  06/09/2024 3.4 (L)   Magnesium  (mg/dL)  Date Value  90/95/7974 2.1   Calcium  (mg/dL)  Date Value  90/95/7974 9.4   Albumin (g/dL)  Date Value  91/69/7974 4.0   Phosphorus (mg/dL)  Date Value  90/95/7974 4.0    ASSESSMENT: 83 y.o. female with PMH including CKD3, HTN, paroxysmal Afib who presents with rhabdomyolysis following a mechanical fall. Pharmacy has been consulted to monitor and replace electrolytes. Patient's renal function is at baseline.  Lab Results  Component Value Date   CREATININE 1.05 (H) 06/09/2024   CREATININE 1.00 06/08/2024   CREATININE 0.98 06/07/2024    mIVF:   Pertinent medications: cholecalciferol  500 units daily  Goal of Therapy:  [x]  Electrolytes WNL []  K >=4.0 and Mg >=2.0   PLAN: K = 3.4 -> ordered Kcl PO Check BMP with next AM labs   Thank you for allowing pharmacy to be a part of this patient's care.  Leonor Argyle, PharmD PGY1 06/09/2024 7:44 AM

## 2024-06-09 NOTE — Plan of Care (Signed)

## 2024-06-09 NOTE — Progress Notes (Signed)
 Occupational Therapy Treatment Patient Details Name: Jaclyn Galvan MRN: [id number] DOB: [birth year] Today's Date: 06/09/2024   History of present illness Jaclyn Galvan is a 83 y.o. Caucasian female with medical history significant for stage III chronic kidney disease, fibromyalgia, hypertension, paroxysmal atrial fibrillation, dyslipidemia, GAD, OSA and CVA, who presented to the ER with acute onset of fall.  The patient was leaving her bathroom on her way to bed at 3 AM this morning when she tripped on the doorway and fell.  She denies any headache or dizziness or blurred vision.  No presyncope or syncope.  She denies any head injuries.  She had a very hard time getting off of the ground.  She was eventually found by her staff at her independent living facility this evening.  She was having back pain and bilateral knee pain.  Per her family she seemed more confused than her usual.  She also reported visual hallucinations seeing things on the ceiling.  She denied any fever or chills.  No cough or wheezing or dyspnea.  No dysuria, oliguria or hematuria, urgency or frequency or flank pain.  No nausea or vomiting or abdominal pain.  No other bleeding diathesis.   OT comments  Pt seen for OT and PT co-tx focused on ADL mobility. Pt received in the bed and alerts with cues and agreeable to session. Pt required MOD A and VC for initiation and continuation of bed mobility to complete sup>sit EOB. Once EOB, pt demonstrated poor balance with heavy posterior and L lateral lean requiring initial heavy assist to correct improving to VC but pt unable to maintain static sitting balance without correcting for LOB. Pt verbalizes understanding that she's falling over but does not attempt to self correct without cues. STS x2 requiring MOD A +2 with ongoing L lateral and posterior LOB requiring various multimodal cues to correct. Pt again endorses knowing she's not in midline but does not attempt to correct without cues. After  seated rest break pt stood again to pivot to the recliner. Question whether having close by visual target for mobility helped pt improve performance. She was able to complete the step pivot with MIN A overall and +2 for safety. Pt continues to benefit from skilled OT services. Continue to recommend current d/c recommendation.       If plan is discharge home, recommend the following:  Two people to help with walking and/or transfers;A lot of help with bathing/dressing/bathroom;Assistance with cooking/housework;Assist for transportation;Help with stairs or ramp for entrance;Direct supervision/assist for medications management   Equipment Recommendations  Other (comment) (defer)    Recommendations for Other Services      Precautions / Restrictions Precautions Precautions: Fall Recall of Precautions/Restrictions: Impaired Restrictions Weight Bearing Restrictions Per Provider Order: No       Mobility Bed Mobility Overal bed mobility: Needs Assistance Bed Mobility: Supine to Sit     Supine to sit: Mod assist, HOB elevated, Used rails     General bed mobility comments: difficulty sequencing, requiring VC for hand rail assist and VC to continue through task    Transfers Overall transfer level: Needs assistance Equipment used: Rolling walker (2 wheels) Transfers: Sit to/from Stand, Bed to chair/wheelchair/BSC Sit to Stand: Mod assist, +2 physical assistance     Step pivot transfers: Min assist, +2 safety/equipment           Balance Overall balance assessment: Needs assistance Sitting-balance support: Feet supported, Bilateral upper extremity supported, Single extremity supported Sitting balance-Leahy Scale: Poor Sitting balance -  Comments: pt with heavy posterior and L lateral lean in sitting without back support, initially required mod-maxA to maintain upright, progressively able to self-correct with VC Postural control: Posterior lean, Left lateral lean Standing balance  support: Bilateral upper extremity supported, Reliant on assistive device for balance Standing balance-Leahy Scale: Poor Standing balance comment: heavy BUE support                           ADL either performed or assessed with clinical judgement   ADL Overall ADL's : Needs assistance/impaired     Grooming: Sitting;Set up;Wash/dry face                                      Extremity/Trunk Assessment              Vision       Perception     Praxis     Communication Communication Communication: No apparent difficulties   Cognition Arousal: Alert, Lethargic Behavior During Therapy: Flat affect Cognition: No family/caregiver present to determine baseline             OT - Cognition Comments: Pt demo's slow cognitive processing, decreased safety awareness and awareness of deficits                 Following commands: Impaired Following commands impaired: Follows one step commands inconsistently, Follows one step commands with increased time      Cueing   Cueing Techniques: Verbal cues, Tactile cues, Gestural cues, Visual cues  Exercises      Shoulder Instructions       General Comments      Pertinent Vitals/ Pain       Pain Assessment Pain Assessment: No/denies pain  Home Living                                          Prior Functioning/Environment              Frequency  Min 2X/week        Progress Toward Goals  OT Goals(current goals can now be found in the care plan section)  Progress towards OT goals: Progressing toward goals  Acute Rehab OT Goals Patient Stated Goal: would like to get my balance better so I do not fall as much OT Goal Formulation: With patient Time For Goal Achievement: 06/13/24 Potential to Achieve Goals: Fair  Plan      Co-evaluation    PT/OT/SLP Co-Evaluation/Treatment: Yes Reason for Co-Treatment: To address functional/ADL transfers;For patient/therapist  safety;Complexity of the patient's impairments (multi-system involvement) PT goals addressed during session: Mobility/safety with mobility;Balance;Proper use of DME OT goals addressed during session: ADL's and self-care;Proper use of Adaptive equipment and DME      AM-PAC OT 6 Clicks Daily Activity     Outcome Measure   Help from another person eating meals?: None Help from another person taking care of personal grooming?: A Little Help from another person toileting, which includes using toliet, bedpan, or urinal?: Total Help from another person bathing (including washing, rinsing, drying)?: A Lot Help from another person to put on and taking off regular upper body clothing?: A Lot Help from another person to put on and taking off regular lower body clothing?: A Lot 6 Click Score: 14  End of Session Equipment Utilized During Treatment: Rolling walker (2 wheels);Gait belt  OT Visit Diagnosis: Unsteadiness on feet (R26.81);Repeated falls (R29.6);Muscle weakness (generalized) (M62.81);History of falling (Z91.81)   Activity Tolerance Patient tolerated treatment well;Other (comment) (cognition)   Patient Left in chair;with call bell/phone within reach;with chair alarm set;with family/visitor present   Nurse Communication          Time: [phone] OT Time Calculation (min): 23 min  Charges: OT General Charges $OT Visit: 1 Visit OT Treatments $Therapeutic Activity: 8-22 mins  Warren SAUNDERS., MPH, MS, OTR/L ascom [phone] 06/09/24, 11:29 AM

## 2024-06-13 LAB — BPAM RBC
Blood Product Expiration Date: 202509172359
ISSUE DATE / TIME: 202509021407
Unit Type and Rh: 5100

## 2024-06-13 LAB — TYPE AND SCREEN
ABO/RH(D): O POS
Antibody Screen: POSITIVE
Donor AG Type: NEGATIVE
Unit division: 0

## 2024-06-18 ENCOUNTER — Other Ambulatory Visit: Payer: Self-pay

## 2024-06-18 ENCOUNTER — Observation Stay
Admission: EM | Admit: 2024-06-18 | Discharge: 2024-06-20 | Disposition: A | Discharge status: Skilled Nursing Facility | Attending: Student in an Organized Health Care Education/Training Program | Admitting: Student in an Organized Health Care Education/Training Program

## 2024-06-18 ENCOUNTER — Emergency Department

## 2024-06-18 DIAGNOSIS — N3000 Acute cystitis without hematuria: Secondary | ICD-10-CM

## 2024-06-18 DIAGNOSIS — N179 Acute kidney failure, unspecified: Principal | ICD-10-CM | POA: Diagnosis present

## 2024-06-18 DIAGNOSIS — Z7982 Long term (current) use of aspirin: Secondary | ICD-10-CM | POA: Insufficient documentation

## 2024-06-18 DIAGNOSIS — I480 Paroxysmal atrial fibrillation: Secondary | ICD-10-CM | POA: Diagnosis not present

## 2024-06-18 DIAGNOSIS — R0602 Shortness of breath: Secondary | ICD-10-CM | POA: Diagnosis present

## 2024-06-18 DIAGNOSIS — I10 Essential (primary) hypertension: Secondary | ICD-10-CM | POA: Insufficient documentation

## 2024-06-18 DIAGNOSIS — R0902 Hypoxemia: Secondary | ICD-10-CM

## 2024-06-18 DIAGNOSIS — G9341 Metabolic encephalopathy: Secondary | ICD-10-CM

## 2024-06-18 DIAGNOSIS — N390 Urinary tract infection, site not specified: Secondary | ICD-10-CM | POA: Diagnosis not present

## 2024-06-18 DIAGNOSIS — F0390 Unspecified dementia without behavioral disturbance: Secondary | ICD-10-CM | POA: Diagnosis present

## 2024-06-18 DIAGNOSIS — R531 Weakness: Secondary | ICD-10-CM

## 2024-06-18 LAB — URINALYSIS, ROUTINE W REFLEX MICROSCOPIC
Bilirubin Urine: NEGATIVE
Glucose, UA: NEGATIVE mg/dL
Hgb urine dipstick: NEGATIVE
Ketones, ur: NEGATIVE mg/dL
Nitrite: NEGATIVE
Protein, ur: NEGATIVE mg/dL
Specific Gravity, Urine: 1.014 (ref 1.005–1.030)
pH: 5 (ref 5.0–8.0)

## 2024-06-18 LAB — CBC WITH DIFFERENTIAL/PLATELET
Abs Immature Granulocytes: 0.03 K/uL (ref 0.00–0.07)
Basophils Absolute: 0.1 K/uL (ref 0.0–0.1)
Basophils Relative: 1 %
Eosinophils Absolute: 0.2 K/uL (ref 0.0–0.5)
Eosinophils Relative: 3 %
HCT: 36 % (ref 36.0–46.0)
Hemoglobin: 11.2 g/dL — ABNORMAL LOW (ref 12.0–15.0)
Immature Granulocytes: 0 %
Lymphocytes Relative: 8 %
Lymphs Abs: 0.7 K/uL (ref 0.7–4.0)
MCH: 28.4 pg (ref 26.0–34.0)
MCHC: 31.1 g/dL (ref 30.0–36.0)
MCV: 91.4 fL (ref 80.0–100.0)
Monocytes Absolute: 0.6 K/uL (ref 0.1–1.0)
Monocytes Relative: 7 %
Neutro Abs: 7 K/uL (ref 1.7–7.7)
Neutrophils Relative %: 81 %
Platelets: 299 K/uL (ref 150–400)
RBC: 3.94 MIL/uL (ref 3.87–5.11)
RDW: 15.1 % (ref 11.5–15.5)
WBC: 8.6 K/uL (ref 4.0–10.5)
nRBC: 0 % (ref 0.0–0.2)

## 2024-06-18 LAB — RESP PANEL BY RT-PCR (RSV, FLU A&B, COVID)  RVPGX2
Influenza A by PCR: NEGATIVE
Influenza B by PCR: NEGATIVE
Resp Syncytial Virus by PCR: NEGATIVE
SARS Coronavirus 2 by RT PCR: NEGATIVE

## 2024-06-18 LAB — COMPREHENSIVE METABOLIC PANEL WITH GFR
ALT: 14 U/L (ref 0–44)
AST: 26 U/L (ref 15–41)
Albumin: 3.8 g/dL (ref 3.5–5.0)
Alkaline Phosphatase: 70 U/L (ref 38–126)
Anion gap: 12 (ref 5–15)
BUN: 35 mg/dL — ABNORMAL HIGH (ref 8–23)
CO2: 30 mmol/L (ref 22–32)
Calcium: 10.1 mg/dL (ref 8.9–10.3)
Chloride: 97 mmol/L — ABNORMAL LOW (ref 98–111)
Creatinine, Ser: 2.34 mg/dL — ABNORMAL HIGH (ref 0.44–1.00)
GFR, Estimated: 20 mL/min — ABNORMAL LOW (ref >60)
Glucose, Bld: 118 mg/dL — ABNORMAL HIGH (ref 70–99)
Potassium: 4 mmol/L (ref 3.5–5.1)
Sodium: 139 mmol/L (ref 135–145)
Total Bilirubin: 1.9 mg/dL — ABNORMAL HIGH (ref 0.0–1.2)
Total Protein: 7.8 g/dL (ref 6.5–8.1)

## 2024-06-18 LAB — BRAIN NATRIURETIC PEPTIDE: B Natriuretic Peptide: 13.6 pg/mL (ref 0.0–100.0)

## 2024-06-18 LAB — TROPONIN I (HIGH SENSITIVITY): Troponin I (High Sensitivity): 10 ng/L (ref <18)

## 2024-06-18 LAB — MAGNESIUM: Magnesium: 2.5 mg/dL — ABNORMAL HIGH (ref 1.7–2.4)

## 2024-06-18 MED ORDER — LACTATED RINGERS IV BOLUS
1000.0000 mL | Freq: Once | INTRAVENOUS | Status: AC
  Administered 2024-06-18: 1000 mL via INTRAVENOUS

## 2024-06-18 MED ORDER — SODIUM CHLORIDE 0.9 % IV SOLN
1.0000 g | Freq: Once | INTRAVENOUS | Status: AC
  Administered 2024-06-18: 1 g via INTRAVENOUS
  Filled 2024-06-18: qty 10

## 2024-06-18 NOTE — Assessment & Plan Note (Signed)
-  Continue donepezil, duloxetine

## 2024-06-18 NOTE — Assessment & Plan Note (Signed)
 Creatinine 2.34 from baseline of 0.98 Likely prerenal/ATN secondary to dehydration from poor oral intake related to recent illness IV hydration and monitor Avoid nephrotoxins

## 2024-06-18 NOTE — ED Notes (Addendum)
 Fall bundle in place, pt comfortable and resting, no current needs at this time

## 2024-06-18 NOTE — Assessment & Plan Note (Signed)
 Not on [facility] Continue aspirin 

## 2024-06-18 NOTE — ED Notes (Signed)
 Per EMS pt was recently dx with a Uti but states facility says pt was to start abx today but they did not have medication yet.

## 2024-06-18 NOTE — H&P (Incomplete)
 History and Physical    Patient: Jaclyn Galvan DOB: 1940/11/18 DOA: 06/18/2024 DOS: the patient was seen and examined on 06/18/2024 PCP: [name], MD  Patient coming from: Home  Chief Complaint:  Chief Complaint  Patient presents with  . Shortness of Breath    HPI: Jaclyn Galvan is a 83 y.o. female with medical history significant for HTN, dementia, paroxysmal atrial fibrillation not on [facility],  recently hospitalized (8/30-9//25) with rhabdo secondary to fall requiring 1 unit PRBC during his stay, discharged to SNF, now being admitted with UTI and AKI.  The initial call out from [employer] (per triage note) was for O2 sat in the low 80s with shortness of breath, and daughter reports a 2-day history of generalized weakness, poor oral intake and not acting herself. In the ED vitals within normal limits with patient saturating at 96% on room air. Labs notable for normal WBC, troponin 10, BNP 13.6, Hemoglobin 11.2, up from 9.8 on discharge a couple weeks prior.  Creatinine 2.34 up from baseline of 0.98 EKG showing sinus at 76 Chest x-ray nonacute Patient started on Rocephin  and given a 1 L LR bolus. Admission requested.     Review of Systems: {ROS_Text:26778}  Past Medical History:  Diagnosis Date  . Adenomatous colon polyp 07/31/2015  . Anemia    resolved from the beginning of this year  . Atrial fibrillation [facility])    cardioversion March 2004  . Cholelithiasis   . Chronic gastritis   . CKD (chronic kidney disease)    chronic kidney disease stage III  . Diverticulosis   . 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 DJD (degenerative joint disease)   . DJD (degenerative joint disease)   . Elbow injury   . Fibromyalgia   . HH (hiatus hernia)   . Hypercholesterolemia    controlled with meds  . Hypertension    controlled with meds, checks regularly  . Irregular Z line of esophagus 11/21/2014  . Liver cyst   .  Osteopenia   . PPD positive   . Reflux esophagitis   . Renal insufficiency   . Sleep apnea   . Stroke [facility])    treated by Dr. Loreli   Past Surgical History:  Procedure Laterality Date  . ADENOIDECTOMY    . Adenomatous polyp of duodenum    . APPENDECTOMY    . BACK SURGERY    . BILATERAL OOPHORECTOMY Right   . BREAST BIOPSY Right 09/25/2015   stereo,benign  . BREAST EXCISIONAL BIOPSY Right    1988 negative  . BREAST EXCISIONAL BIOPSY Left    ? when,benign  . CHOLECYSTECTOMY    . COLONOSCOPY WITH PROPOFOL  N/A 10/29/2018   Procedure: COLONOSCOPY WITH PROPOFOL ;  Surgeon: Gaylyn Gladis PENNER, MD;  Location: [facility] ENDOSCOPY;  Service: Endoscopy;  Laterality: N/A;  . DILATION AND CURETTAGE OF UTERUS  1966  . ESOPHAGOGASTRODUODENOSCOPY N/A 10/29/2018   Procedure: ESOPHAGOGASTRODUODENOSCOPY (EGD);  Surgeon: Gaylyn Gladis PENNER, MD;  Location: [facility] ENDOSCOPY;  Service: Endoscopy;  Laterality: N/A;  . ESOPHAGOGASTRODUODENOSCOPY (EGD) WITH PROPOFOL  N/A 07/31/2015   Procedure: ESOPHAGOGASTRODUODENOSCOPY (EGD) WITH PROPOFOL ;  Surgeon: Gladis PENNER Gaylyn, MD;  Location: [facility] ENDOSCOPY;  Service: Endoscopy;  Laterality: N/A;  . ESOPHAGOGASTRODUODENOSCOPY (EGD) WITH PROPOFOL  N/A 05/06/2016   Procedure: ESOPHAGOGASTRODUODENOSCOPY (EGD) WITH PROPOFOL ;  Surgeon: Gladis PENNER Gaylyn, MD;  Location: [facility] ENDOSCOPY;  Service: Endoscopy;  Laterality: N/A;  . ESOPHAGOGASTRODUODENOSCOPY (EGD) WITH PROPOFOL  N/A 10/30/2017  Procedure: ESOPHAGOGASTRODUODENOSCOPY (EGD) WITH PROPOFOL ;  Surgeon: Gaylyn Gladis PENNER, MD;  Location: [facility] ENDOSCOPY;  Service: Endoscopy;  Laterality: N/A;  . ESOPHAGOGASTRODUODENOSCOPY (EGD) WITH PROPOFOL  N/A 04/27/2020   Procedure: ESOPHAGOGASTRODUODENOSCOPY (EGD) WITH PROPOFOL ;  Surgeon: Maryruth Ole DASEN, MD;  Location: [facility] ENDOSCOPY;  Service: Endoscopy;  Laterality: N/A;  . ESOPHAGOGASTRODUODENOSCOPY (EGD) WITH PROPOFOL  N/A 06/24/2022   Procedure: ESOPHAGOGASTRODUODENOSCOPY (EGD) WITH  PROPOFOL ;  Surgeon: Maryruth Ole DASEN, MD;  Location: [facility] ENDOSCOPY;  Service: Endoscopy;  Laterality: N/A;  . JOINT REPLACEMENT Right    total knee Dr. Cleotilde with excessive postoperative bleeding  . RHIZOTOMY    . TONSILLECTOMY    . TOTAL KNEE ARTHROPLASTY Bilateral    2001,2006  . TUBAL LIGATION  1971   Social History:  reports that she has never smoked. She has never used smokeless tobacco. She reports that she does not drink alcohol and does not use drugs.  Allergies  Allergen Reactions  . Ciprofloxacin Nausea And Vomiting  . Morphine  And Codeine Nausea And Vomiting  . Pantoprazole  Other (See Comments)    Muscle spasms    Family History  Problem Relation Age of Onset  . Breast cancer Mother 70  . Diabetes Neg Hx   . Heart disease Neg Hx   . Colon cancer Neg Hx   . Ovarian cancer Neg Hx     Prior to Admission medications   Medication Sig Start Date End Date Taking? Authorizing Provider  amLODipine  (NORVASC ) 5 MG tablet Take 5 mg by mouth daily.   Yes [provider]  ascorbic acid  (VITAMIN C ) 1000 MG tablet Take 1,000 mg by mouth daily.   Yes [provider]  aspirin  EC 81 MG tablet Take 81 mg by mouth daily.   Yes [provider]  aspirin -acetaminophen -caffeine (EXCEDRIN MIGRAINE) 250-250-65 MG per tablet Take 1 tablet by mouth every 6 (six) hours as needed for headache.   Yes [provider]  Black Cohosh-SoyIsoflav-Magnol (ESTROVEN MENOPAUSE RELIEF) CAPS Take 1 capsule by mouth at bedtime.   Yes [provider]  calcium  carbonate 1250 MG capsule Take 1,250 mg by mouth 2 (two) times daily with a meal.   Yes [provider]  cholecalciferol  (VITAMIN D ) 400 UNITS TABS tablet Take 400 Units by mouth daily.   Yes [provider]  co-enzyme Q-10 30 MG capsule Take 30 mg by mouth daily.    Yes [provider]  donepezil  (ARICEPT ) 5 MG tablet Take 5 mg by mouth at bedtime.   Yes [provider]   DULoxetine  HCl 30 MG CSDR Take 30 mg by mouth daily.   Yes [provider]  famotidine  (PEPCID ) 20 MG tablet Take 1 tablet (20 mg total) by mouth daily. 06/08/24 07/08/24 Yes Maree Hue, MD  fluticasone-salmeterol [facility] INHUB) 250-50 MCG/ACT AEPB Inhale 1 puff into the lungs in the morning and at bedtime.   Yes [provider]  losartan  (COZAAR ) 100 MG tablet Take 100 mg by mouth daily.    Yes [provider]  Multiple Vitamin (MULTIVITAMIN) tablet Take 1 tablet by mouth daily.   Yes [provider]  ondansetron  (ZOFRAN ) 4 MG tablet Take 4 mg by mouth every 6 (six) hours as needed for nausea or vomiting.   Yes [provider]  torsemide (DEMADEX) 20 MG tablet Take 20 mg by mouth every other day.   Yes [provider]  vitamin B-12 (CYANOCOBALAMIN ) 100 MCG tablet Take 100 mcg by mouth daily.   Yes [provider]  atorvastatin  (LIPITOR) 10 MG tablet Take 5 mg by mouth daily.  Patient not taking: Reported on 06/18/2024    [provider]  Nutritional Supplements (ESTROVEN PMS PO) Take by mouth. Patient not taking: Reported on 06/18/2024    [provider]  sertraline  (ZOLOFT ) 25 MG tablet Take 25 mg by mouth daily. 2 tablets everay am. Patient not taking: Reported on 06/18/2024    [provider]    Physical Exam: Vitals:   06/18/24 2036 06/18/24 2044  BP:  (!) 139/58  Pulse:  83  Resp:  19  Temp:  97.7 F (36.5 C)  TempSrc:  Oral  SpO2:  96%  Weight: 90.7 kg   Height: 5' 6 (1.676 m)    Physical Exam  Labs on Admission: I have personally reviewed following labs and imaging studies  CBC: Recent Labs  Lab 06/18/24 2053  WBC 8.6  NEUTROABS 7.0  HGB 11.2*  HCT 36.0  MCV 91.4  PLT 299   Basic Metabolic Panel: Recent Labs  Lab 06/18/24 2053  NA 139  K 4.0  CL 97*  CO2 30  GLUCOSE 118*  BUN 35*  CREATININE 2.34*  CALCIUM  10.1  MG 2.5*   GFR: Estimated Creatinine Clearance: 20.7  mL/min (A) (by C-G formula based on SCr of 2.34 mg/dL (H)). Liver Function Tests: Recent Labs  Lab 06/18/24 2053  AST 26  ALT 14  ALKPHOS 70  BILITOT 1.9*  PROT 7.8  ALBUMIN 3.8   No results for input(s): LIPASE, AMYLASE in the last 168 hours. No results for input(s): AMMONIA in the last 168 hours. Coagulation Profile: No results for input(s): INR, PROTIME in the last 168 hours. Cardiac Enzymes: No results for input(s): CKTOTAL, CKMB, CKMBINDEX, TROPONINI in the last 168 hours. BNP (last 3 results) No results for input(s): PROBNP in the last 8760 hours. HbA1C: No results for input(s): HGBA1C in the last 72 hours. CBG: No results for input(s): GLUCAP in the last 168 hours. Lipid Profile: No results for input(s): CHOL, HDL, LDLCALC, TRIG, CHOLHDL, LDLDIRECT in the last 72 hours. Thyroid Function Tests: No results for input(s): TSH, T4TOTAL, FREET4, T3FREE, THYROIDAB in the last 72 hours. Anemia Panel: No results for input(s): VITAMINB12, FOLATE, FERRITIN, TIBC, IRON, RETICCTPCT in the last 72 hours. Urine analysis:    Component Value Date/Time   COLORURINE YELLOW (A) 06/18/2024 2234   APPEARANCEUR HAZY (A) 06/18/2024 2234   LABSPEC 1.014 06/18/2024 2234   PHURINE 5.0 06/18/2024 2234   GLUCOSEU NEGATIVE 06/18/2024 2234   HGBUR NEGATIVE 06/18/2024 2234   BILIRUBINUR NEGATIVE 06/18/2024 2234   KETONESUR NEGATIVE 06/18/2024 2234   PROTEINUR NEGATIVE 06/18/2024 2234   NITRITE NEGATIVE 06/18/2024 2234   LEUKOCYTESUR MODERATE (A) 06/18/2024 2234    Radiological Exams on Admission: DG Chest Portable 1 View Result Date: 06/18/2024 CLINICAL DATA:  Shortness of breath EXAM: PORTABLE CHEST 1 VIEW COMPARISON:  06/04/2024 FINDINGS: Cardiac shadow is stable. Aortic calcifications are again noted. Lungs are clear bilaterally. No focal infiltrate or effusion is noted. No acute bony abnormality is seen. Degenerative changes in the  shoulder joints are noted. IMPRESSION: No active disease. Electronically Signed   By: Oneil Devonshire M.D.   On: 06/18/2024 21:15   Data Reviewed for HPI: Relevant notes from primary care and specialist visits, past discharge summaries as available in EHR, including Care Everywhere. Prior diagnostic testing as pertinent to current admission diagnoses Updated medications and problem lists for reconciliation ED course, including vitals, labs, imaging, treatment and response  to treatment Triage notes, nursing and pharmacy notes and ED provider's notes Notable results as noted above in HPI      Assessment and Plan: * AKI (acute kidney injury) (HCC) Creatinine 2.34 from baseline of 0.98 Likely prerenal/ATN secondary to dehydration from poor oral intake related to recent illness IV hydration and monitor Avoid nephrotoxins  Urinary tract infection Rocephin  Follow cultures  Dementia (HCC) Continue donepezil , duloxetine   Paroxysmal atrial fibrillation (HCC) Not on AC Continue aspirin         DVT prophylaxis: Lovenox ***  Consults: none***  Advance Care Planning:   Code Status: Prior ***  Family Communication: none***  Disposition Plan: Back to previous home environment  Severity of Illness: {Observation/Inpatient:21159}  Author: Delayne LULLA Solian, MD 06/18/2024 11:55 PM  For on call review [email or web address].

## 2024-06-18 NOTE — H&P (Addendum)
 History and Physical    Patient: Jaclyn Galvan FMW:[id number] DOB: 1941/07/01 DOA: 06/18/2024 DOS: the patient was seen and examined on 06/18/2024 PCP: [name], MD  Patient coming from: Home  Chief Complaint:  Chief Complaint  Patient presents with   Shortness of Breath    HPI: Jaclyn Galvan is a 83 y.o. female with medical history significant for HTN, dementia, paroxysmal atrial fibrillation not on [facility],  recently hospitalized (8/30-9//25) with rhabdo secondary to fall requiring 1 unit PRBC during his stay, discharged to SNF, now being admitted with UTI and AKI.  The initial call out from [employer] (per triage note) was for O2 sat in the low 80s with shortness of breath I spoke with daughter Jaclyn Galvan on the phone who states that patient had not been herself since her recent fall however over the past 3 days she has really declined.  She was very lethargic, not eating and did not want to participate with physical therapy.  At baseline her mother is able to interact in spite of dementia diagnosis.  The facility did a urinalysis a couple days ago and then tonight they called and said the urine culture was positive but they did not have the antibiotics.  She states the nurse at the facility recommended patient go to the ED due to the lethargy, low O2 sat and low blood pressure.   In the ED vitals within normal limits with patient saturating at 96% on room air. Labs notable for normal WBC, troponin 10, BNP 13.6, Hemoglobin 11.2, up from 9.8 on discharge a couple weeks prior.  Creatinine 2.34 up from baseline of 0.98 EKG showing sinus at 76 Chest x-ray nonacute Patient started on Rocephin  and given a 1 L LR bolus. Admission requested.     Past Medical History:  Diagnosis Date   Adenomatous colon polyp 07/31/2015   Anemia    resolved from the beginning of this year   Atrial fibrillation [facility])    cardioversion March 2004   Cholelithiasis    Chronic gastritis    CKD  (chronic kidney disease)    chronic kidney disease stage III   Diverticulosis    DJD (degenerative joint disease)    B knees, B knee replacements, 1991/1996   DJD (degenerative joint disease)    DJD (degenerative joint disease)    Elbow injury    Fibromyalgia    HH (hiatus hernia)    Hypercholesterolemia    controlled with meds   Hypertension    controlled with meds, checks regularly   Irregular Z line of esophagus 11/21/2014   Liver cyst    Osteopenia    PPD positive    Reflux esophagitis    Renal insufficiency    Sleep apnea    Stroke [facility])    treated by Dr. Loreli   Past Surgical History:  Procedure Laterality Date   ADENOIDECTOMY     Adenomatous polyp of duodenum     APPENDECTOMY     BACK SURGERY     BILATERAL OOPHORECTOMY Right    BREAST BIOPSY Right 09/25/2015   stereo,benign   BREAST EXCISIONAL BIOPSY Right    1988 negative   BREAST EXCISIONAL BIOPSY Left    ? when,benign   CHOLECYSTECTOMY     COLONOSCOPY WITH PROPOFOL  N/A 10/29/2018   Procedure: COLONOSCOPY WITH PROPOFOL ;  Surgeon: Gaylyn Gladis PENNER, MD;  Location: [facility] ENDOSCOPY;  Service: Endoscopy;  Laterality: N/A;   DILATION AND CURETTAGE OF UTERUS  1966  ESOPHAGOGASTRODUODENOSCOPY N/A 10/29/2018   Procedure: ESOPHAGOGASTRODUODENOSCOPY (EGD);  Surgeon: Gaylyn Gladis PENNER, MD;  Location: [facility] ENDOSCOPY;  Service: Endoscopy;  Laterality: N/A;   ESOPHAGOGASTRODUODENOSCOPY (EGD) WITH PROPOFOL  N/A 07/31/2015   Procedure: ESOPHAGOGASTRODUODENOSCOPY (EGD) WITH PROPOFOL ;  Surgeon: Gladis PENNER Gaylyn, MD;  Location: [facility] ENDOSCOPY;  Service: Endoscopy;  Laterality: N/A;   ESOPHAGOGASTRODUODENOSCOPY (EGD) WITH PROPOFOL  N/A 05/06/2016   Procedure: ESOPHAGOGASTRODUODENOSCOPY (EGD) WITH PROPOFOL ;  Surgeon: Gladis PENNER Gaylyn, MD;  Location: [facility] ENDOSCOPY;  Service: Endoscopy;  Laterality: N/A;   ESOPHAGOGASTRODUODENOSCOPY (EGD) WITH PROPOFOL  N/A 10/30/2017   Procedure: ESOPHAGOGASTRODUODENOSCOPY (EGD) WITH PROPOFOL ;   Surgeon: Gaylyn Gladis PENNER, MD;  Location: [facility] ENDOSCOPY;  Service: Endoscopy;  Laterality: N/A;   ESOPHAGOGASTRODUODENOSCOPY (EGD) WITH PROPOFOL  N/A 04/27/2020   Procedure: ESOPHAGOGASTRODUODENOSCOPY (EGD) WITH PROPOFOL ;  Surgeon: Maryruth Ole DASEN, MD;  Location: [facility] ENDOSCOPY;  Service: Endoscopy;  Laterality: N/A;   ESOPHAGOGASTRODUODENOSCOPY (EGD) WITH PROPOFOL  N/A 06/24/2022   Procedure: ESOPHAGOGASTRODUODENOSCOPY (EGD) WITH PROPOFOL ;  Surgeon: Maryruth Ole DASEN, MD;  Location: [facility] ENDOSCOPY;  Service: Endoscopy;  Laterality: N/A;   JOINT REPLACEMENT Right    total knee Dr. Cleotilde with excessive postoperative bleeding   RHIZOTOMY     TONSILLECTOMY     TOTAL KNEE ARTHROPLASTY Bilateral    2001,2006   TUBAL LIGATION  1971   Social History:  reports that she has never smoked. She has never used smokeless tobacco. She reports that she does not drink alcohol and does not use drugs.  Allergies  Allergen Reactions   Ciprofloxacin Nausea And Vomiting   Morphine  And Codeine Nausea And Vomiting   Pantoprazole  Other (See Comments)    Muscle spasms    Family History  Problem Relation Age of Onset   Breast cancer Mother 63   Diabetes Neg Hx    Heart disease Neg Hx    Colon cancer Neg Hx    Ovarian cancer Neg Hx     Prior to Admission medications   Medication Sig Start Date End Date Taking? Authorizing Provider  amLODipine  (NORVASC ) 5 MG tablet Take 5 mg by mouth daily.   Yes [provider]  ascorbic acid  (VITAMIN C ) 1000 MG tablet Take 1,000 mg by mouth daily.   Yes [provider]  aspirin  EC 81 MG tablet Take 81 mg by mouth daily.   Yes [provider]  aspirin -acetaminophen -caffeine (EXCEDRIN MIGRAINE) 250-250-65 MG per tablet Take 1 tablet by mouth every 6 (six) hours as needed for headache.   Yes [provider]  Black Cohosh-SoyIsoflav-Magnol (ESTROVEN MENOPAUSE RELIEF) CAPS Take 1 capsule by mouth at bedtime.   Yes [provider]  calcium  carbonate 1250 MG capsule Take 1,250 mg by mouth 2 (two) times daily with a meal.   Yes [provider]  cholecalciferol  (VITAMIN D ) 400 UNITS TABS tablet Take 400 Units by mouth daily.   Yes [provider]  co-enzyme Q-10 30 MG capsule Take 30 mg by mouth daily.    Yes [provider]  donepezil  (ARICEPT ) 5 MG tablet Take 5 mg by mouth at bedtime.   Yes [provider]  DULoxetine  HCl 30 MG CSDR Take 30 mg by mouth daily.   Yes [provider]  famotidine  (PEPCID ) 20 MG tablet Take 1 tablet (20 mg total) by mouth daily. 06/08/24 07/08/24 Yes Maree Hue, MD  fluticasone-salmeterol [facility] INHUB) 250-50 MCG/ACT AEPB Inhale 1 puff into the lungs in the morning and at bedtime.   Yes [provider]  losartan  (COZAAR ) 100 MG tablet  Take 100 mg by mouth daily.    Yes [provider]  Multiple Vitamin (MULTIVITAMIN) tablet Take 1 tablet by mouth daily.   Yes [provider]  ondansetron  (ZOFRAN ) 4 MG tablet Take 4 mg by mouth every 6 (six) hours as needed for nausea or vomiting.   Yes [provider]  torsemide (DEMADEX) 20 MG tablet Take 20 mg by mouth every other day.   Yes [provider]  vitamin B-12 (CYANOCOBALAMIN ) 100 MCG tablet Take 100 mcg by mouth daily.   Yes [provider]  atorvastatin  (LIPITOR) 10 MG tablet Take 5 mg by mouth daily.  Patient not taking: Reported on 06/18/2024    [provider]  Nutritional Supplements (ESTROVEN PMS PO) Take by mouth. Patient not taking: Reported on 06/18/2024    [provider]  sertraline  (ZOLOFT ) 25 MG tablet Take 25 mg by mouth daily. 2 tablets everay am. Patient not taking: Reported on 06/18/2024    [provider]    Physical Exam: Vitals:   06/18/24 2036 06/18/24 2044  BP:  (!) 139/58  Pulse:  83  Resp:  19  Temp:  97.7 F (36.5 C)  TempSrc:  Oral  SpO2:  96%  Weight: 90.7 kg   Height: 5' 6 (1.676 m)     Physical Exam Vitals and nursing note reviewed.  Constitutional:      General: She is not in acute distress. HENT:     Head: Normocephalic and atraumatic.  Cardiovascular:     Rate and Rhythm: Normal rate and regular rhythm.     Heart sounds: Normal heart sounds.  Pulmonary:     Effort: Pulmonary effort is normal.     Breath sounds: Normal breath sounds.  Abdominal:     Palpations: Abdomen is soft.     Tenderness: There is no abdominal tenderness.  Neurological:     General: No focal deficit present.     Labs on Admission: I have personally reviewed following labs and imaging studies  CBC: Recent Labs  Lab 06/18/24 2053  WBC 8.6  NEUTROABS 7.0  HGB 11.2*  HCT 36.0  MCV 91.4  PLT 299   Basic Metabolic Panel: Recent Labs  Lab 06/18/24 2053  NA 139  K 4.0  CL 97*  CO2 30  GLUCOSE 118*  BUN 35*  CREATININE 2.34*  CALCIUM  10.1  MG 2.5*   GFR: Estimated Creatinine Clearance: 20.7 mL/min (A) (by C-G formula based on SCr of 2.34 mg/dL (H)). Liver Function Tests: Recent Labs  Lab 06/18/24 2053  AST 26  ALT 14  ALKPHOS 70  BILITOT 1.9*  PROT 7.8  ALBUMIN 3.8   No results for input(s): LIPASE, AMYLASE in the last 168 hours. No results for input(s): AMMONIA in the last 168 hours. Coagulation Profile: No results for input(s): INR, PROTIME in the last 168 hours. Cardiac Enzymes: No results for input(s): CKTOTAL, CKMB, CKMBINDEX, TROPONINI in the last 168 hours. BNP (last 3 results) No results for input(s): PROBNP in the last 8760 hours. HbA1C: No results for input(s): HGBA1C in the last 72 hours. CBG: No results for input(s): GLUCAP in the last 168 hours. Lipid Profile: No results for input(s): CHOL, HDL, LDLCALC, TRIG, CHOLHDL, LDLDIRECT in the last 72 hours. Thyroid Function Tests: No results for input(s): TSH, T4TOTAL, FREET4, T3FREE, THYROIDAB in the last 72 hours. Anemia Panel: No results for  input(s): VITAMINB12, FOLATE, FERRITIN, TIBC, IRON, RETICCTPCT in the last 72 hours. Urine analysis:    Component  Value Date/Time   COLORURINE YELLOW (A) 06/18/2024 2234   APPEARANCEUR HAZY (A) 06/18/2024 2234   LABSPEC 1.014 06/18/2024 2234   PHURINE 5.0 06/18/2024 2234   GLUCOSEU NEGATIVE 06/18/2024 2234   HGBUR NEGATIVE 06/18/2024 2234   BILIRUBINUR NEGATIVE 06/18/2024 2234   KETONESUR NEGATIVE 06/18/2024 2234   PROTEINUR NEGATIVE 06/18/2024 2234   NITRITE NEGATIVE 06/18/2024 2234   LEUKOCYTESUR MODERATE (A) 06/18/2024 2234    Radiological Exams on Admission: DG Chest Portable 1 View Result Date: 06/18/2024 CLINICAL DATA:  Shortness of breath EXAM: PORTABLE CHEST 1 VIEW COMPARISON:  06/04/2024 FINDINGS: Cardiac shadow is stable. Aortic calcifications are again noted. Lungs are clear bilaterally. No focal infiltrate or effusion is noted. No acute bony abnormality is seen. Degenerative changes in the shoulder joints are noted. IMPRESSION: No active disease. Electronically Signed   By: Oneil Devonshire M.D.   On: 06/18/2024 21:15   Data Reviewed for HPI: Relevant notes from primary care and specialist visits, past discharge summaries as available in EHR, including Care Everywhere. Prior diagnostic testing as pertinent to current admission diagnoses Updated medications and problem lists for reconciliation ED course, including vitals, labs, imaging, treatment and response to treatment Triage notes, nursing and pharmacy notes and ED provider's notes Notable results as noted above in HPI      Assessment and Plan: * AKI (acute kidney injury) (HCC) Creatinine 2.34 from baseline of 0.98 Likely prerenal/ATN secondary to dehydration from poor oral intake related to recent illness Hold home losartan  and torsemide IV hydration and monitor Avoid nephrotoxins  Hypoxia Elevated D-dimer, concern for PE Was reportedly hypoxic at facility Since arrival not hypoxic tachycardic or  tachypneic D-dimer done following admission however elevated above 10 Elevated creatinine precludes contrasted study Will treat empirically with weight-based Lovenox  pending V/Q VQ scan in the a.m.  Urinary tract infection Rocephin  Follow cultures  Acute metabolic encephalopathy Patient is not at baseline, lethargic,  out of it not at her baseline, worse since her recent fall but acutely worse in the past few days Likely secondary to UTI and dehydration Neurologic checks with fall and aspiration precautions Delirium precautions   Generalized weakness History of recent fall with rhabdomyolysis Hypoxia reported at facility, none since arrival Patient is not hypoxic, tachycardic or tachypneic however in view of recent hospitalization and reports of O2 sats in the 80s, will get D-dimer to evaluate need for further diagnostic workup for thromboembolism  Dementia (HCC) Continue donepezil , duloxetine   Paroxysmal atrial fibrillation (HCC) Not on AC Continue aspirin         DVT prophylaxis: Lovenox   Consults: none  Advance Care Planning:   Code Status: Prior   Family Communication: Daughter, Jaclyn Galvan  Disposition Plan: Back to previous home environment  Severity of Illness: The appropriate patient status for this patient is OBSERVATION. Observation status is judged to be reasonable and necessary in order to provide the required intensity of service to ensure the patient's safety. The patient's presenting symptoms, physical exam findings, and initial radiographic and laboratory data in the context of their medical condition is felt to place them at decreased risk for further clinical deterioration. Furthermore, it is anticipated that the patient will be medically stable for discharge from the hospital within 2 midnights of admission.   Author: Delayne LULLA Solian, MD 06/18/2024 11:55 PM  For on call review [email or web address].

## 2024-06-18 NOTE — Assessment & Plan Note (Signed)
 Rocephin   Follow cultures

## 2024-06-18 NOTE — ED Triage Notes (Addendum)
 Pt BIB EMS from [facility] with complaints of SOB. Per EMS they were called for low )2 saturation in the low 80s  laying  int the supine position. Pt has no complaints at this time. Pt does have DNR in place. Pt is not on O2 at baseline.

## 2024-06-18 NOTE — ED Provider Notes (Signed)
 [facility] Provider Note    Event Date/Time   First MD Initiated Contact with Patient 06/18/24 2035     (approximate)   History   Shortness of Breath   HPI  Jaclyn Galvan is a 83 y.o. female who presents to the ED for evaluation of Shortness of Breath   I review a medical DC summary from 9 days ago.  History of stage III CKD, paroxysmal A-fib, OSA who was found down at a local independent living facility and admitted for rhabdomyolysis.  Discharged to SNF, increased level of care.  Anemia of chronic disease and required a unit of PRBCs while here  Patient presents for evaluation of 2 days of generalized weakness, depressed mental status, poor intake.  Daughter provides majority of history at the bedside, daughter visits her every day at local SNF.  Has noticed a decline dramatically over the past 2 days.  They were told at the facility that she might have a UTI based off of urinalysis from yesterday but none of this information is provided to the ED.  Patient reports feeling a little bit weak but overall well without any particular complaint such as pain.   Physical Exam   Triage Vital Signs: ED Triage Vitals  Encounter Vitals Group     BP      Girls Systolic BP Percentile      Girls Diastolic BP Percentile      Boys Systolic BP Percentile      Boys Diastolic BP Percentile      Pulse      Resp      Temp      Temp src      SpO2      Weight      Height      Head Circumference      Peak Flow      Pain Score      Pain Loc      Pain Education      Exclude from Growth Chart     Most recent vital signs: Vitals:   06/18/24 2044  BP: (!) 139/58  Pulse: 83  Resp: 19  Temp: 97.7 F (36.5 C)  SpO2: 96%    General: Awake, no distress.  CV:  Good peripheral perfusion.  Resp:  Normal effort.  Abd:  No distention.  MSK:  No deformity noted.  Neuro:  No focal deficits appreciated. Other:     ED Results / Procedures / Treatments    Labs (all labs ordered are listed, but only abnormal results are displayed) Labs Reviewed  COMPREHENSIVE METABOLIC PANEL WITH GFR - Abnormal; Notable for the following components:      Result Value   Chloride 97 (*)    Glucose, Bld 118 (*)    BUN 35 (*)    Creatinine, Ser 2.34 (*)    Total Bilirubin 1.9 (*)    GFR, Estimated 20 (*)    All other components within normal limits  CBC WITH DIFFERENTIAL/PLATELET - Abnormal; Notable for the following components:   Hemoglobin 11.2 (*)    All other components within normal limits  MAGNESIUM  - Abnormal; Notable for the following components:   Magnesium  2.5 (*)    All other components within normal limits  URINALYSIS, ROUTINE W REFLEX MICROSCOPIC - Abnormal; Notable for the following components:   Color, Urine YELLOW (*)    APPearance HAZY (*)    Leukocytes,Ua MODERATE (*)    Bacteria, UA MANY (*)  All other components within normal limits  RESP PANEL BY RT-PCR (RSV, FLU A&B, COVID)  [id number]  URINE CULTURE  BRAIN NATRIURETIC PEPTIDE  TROPONIN I (HIGH SENSITIVITY)  TROPONIN I (HIGH SENSITIVITY)    EKG Treatments baseline clouds fine detail, narrow complex rhythm that like atrial fibrillation with a rate of 76 bpm without clear acute ischemic features.  RADIOLOGY CXR interpreted by me without evidence of acute cardiopulmonary pathology.  Official radiology report(s): DG Chest Portable 1 View Result Date: 06/18/2024 CLINICAL DATA:  Shortness of breath EXAM: PORTABLE CHEST 1 VIEW COMPARISON:  06/04/2024 FINDINGS: Cardiac shadow is stable. Aortic calcifications are again noted. Lungs are clear bilaterally. No focal infiltrate or effusion is noted. No acute bony abnormality is seen. Degenerative changes in the shoulder joints are noted. IMPRESSION: No active disease. Electronically Signed   By: Oneil Devonshire M.D.   On: 06/18/2024 21:15    PROCEDURES and INTERVENTIONS:  .1-3 Lead EKG Interpretation  Performed by: Claudene Rover,  MD Authorized by: Claudene Rover, MD     Interpretation: normal     ECG rate:  86   ECG rate assessment: normal     Rhythm: atrial fibrillation     Ectopy: none     Conduction: normal     Medications  cefTRIAXone  (ROCEPHIN ) 1 g in sodium chloride  0.9 % 100 mL IVPB (has no administration in time range)  lactated ringers  bolus 1,000 mL (1,000 mLs Intravenous New Bag/Given 06/18/24 2209)     IMPRESSION / MDM / ASSESSMENT AND PLAN / ED COURSE  I reviewed the triage vital signs and the nursing notes.  Differential diagnosis includes, but is not limited to, dehydration, sepsis, UTI, AKI, viral syndrome, symptomatic anemia  {Patient presents with symptoms of an acute illness or injury that is potentially life-threatening.  Patient presents with 2 days of decreased responsiveness and generalized weakness with signs of UTI and AKI requiring medical admission.  No signs of shock or instability.  No signs of sepsis she has a normal WBC.  Metabolic panel with AKI that I suspect is prerenal due to poor intake.  Negative viral swabs.  Initiate IV fluids, ceftriaxone  and consult medicine for admission.  Clinical Course as of 06/18/24 2314  Sat Jun 18, 2024  2200 Last couple of days, not eating as much, not really coherent. Told she had a UTI today, then immediately called back saying low sats so came to ED. [DS]  2301 S/o from Dr. Claudene: - 83F w/ increased weakness, AKI, c/f underlying UTI - f/u UA  Anticipate admission [MM]    Clinical Course User Index [DS] Claudene Rover, MD [MM] Clarine Ozell LABOR, MD     FINAL CLINICAL IMPRESSION(S) / ED DIAGNOSES   Final diagnoses:  AKI (acute kidney injury) (HCC)  Acute cystitis without hematuria     Rx / DC Orders   ED Discharge Orders     None        Note:  This document was prepared using Dragon voice recognition software and may include unintentional dictation errors.   Claudene Rover, MD 06/18/24 [phone]

## 2024-06-19 DIAGNOSIS — G9341 Metabolic encephalopathy: Secondary | ICD-10-CM

## 2024-06-19 DIAGNOSIS — N179 Acute kidney failure, unspecified: Secondary | ICD-10-CM | POA: Diagnosis not present

## 2024-06-19 DIAGNOSIS — R531 Weakness: Secondary | ICD-10-CM

## 2024-06-19 DIAGNOSIS — R0902 Hypoxemia: Secondary | ICD-10-CM

## 2024-06-19 LAB — CBC
HCT: 32.7 % — ABNORMAL LOW (ref 36.0–46.0)
Hemoglobin: 10.4 g/dL — ABNORMAL LOW (ref 12.0–15.0)
MCH: 28.9 pg (ref 26.0–34.0)
MCHC: 31.8 g/dL (ref 30.0–36.0)
MCV: 90.8 fL (ref 80.0–100.0)
Platelets: 282 K/uL (ref 150–400)
RBC: 3.6 MIL/uL — ABNORMAL LOW (ref 3.87–5.11)
RDW: 15.1 % (ref 11.5–15.5)
WBC: 10 K/uL (ref 4.0–10.5)
nRBC: 0 % (ref 0.0–0.2)

## 2024-06-19 LAB — BASIC METABOLIC PANEL WITH GFR
Anion gap: 11 (ref 5–15)
BUN: 30 mg/dL — ABNORMAL HIGH (ref 8–23)
CO2: 28 mmol/L (ref 22–32)
Calcium: 10.4 mg/dL — ABNORMAL HIGH (ref 8.9–10.3)
Chloride: 103 mmol/L (ref 98–111)
Creatinine, Ser: 1.71 mg/dL — ABNORMAL HIGH (ref 0.44–1.00)
GFR, Estimated: 29 mL/min — ABNORMAL LOW (ref >60)
Glucose, Bld: 108 mg/dL — ABNORMAL HIGH (ref 70–99)
Potassium: 3.4 mmol/L — ABNORMAL LOW (ref 3.5–5.1)
Sodium: 142 mmol/L (ref 135–145)

## 2024-06-19 LAB — TROPONIN I (HIGH SENSITIVITY): Troponin I (High Sensitivity): 12 ng/L (ref <18)

## 2024-06-19 LAB — D-DIMER, QUANTITATIVE: D-Dimer, Quant: 10.68 ug{FEU}/mL — ABNORMAL HIGH (ref 0.00–0.50)

## 2024-06-19 MED ORDER — ACETAMINOPHEN 650 MG RE SUPP: 650.0000 mg | Freq: Four times a day (QID) | RECTAL | Status: DC | PRN

## 2024-06-19 MED ORDER — ENOXAPARIN SODIUM 40 MG/0.4ML IJ SOSY
40.0000 mg | PREFILLED_SYRINGE | INTRAMUSCULAR | Status: DC
  Administered 2024-06-19: 40 mg via SUBCUTANEOUS
  Filled 2024-06-19: qty 0.4

## 2024-06-19 MED ORDER — SODIUM CHLORIDE 0.9 % IV SOLN: INTRAVENOUS | Status: AC

## 2024-06-19 MED ORDER — DONEPEZIL HCL 5 MG PO TABS
5.0000 mg | ORAL_TABLET | Freq: Every day | ORAL | Status: DC
  Administered 2024-06-19 (×2): 5 mg via ORAL
  Filled 2024-06-19 (×2): qty 1

## 2024-06-19 MED ORDER — DULOXETINE HCL 30 MG PO CPEP
30.0000 mg | ORAL_CAPSULE | Freq: Every day | ORAL | Status: DC
  Administered 2024-06-19 – 2024-06-20 (×2): 30 mg via ORAL
  Filled 2024-06-19 (×2): qty 1

## 2024-06-19 MED ORDER — ONDANSETRON HCL 4 MG PO TABS: 4.0000 mg | ORAL_TABLET | Freq: Four times a day (QID) | ORAL | Status: DC | PRN

## 2024-06-19 MED ORDER — ENOXAPARIN SODIUM 40 MG/0.4ML IJ SOSY: 40.0000 mg | PREFILLED_SYRINGE | INTRAMUSCULAR | Status: DC

## 2024-06-19 MED ORDER — ENOXAPARIN SODIUM 100 MG/ML IJ SOSY
1.0000 mg/kg | PREFILLED_SYRINGE | Freq: Once | INTRAMUSCULAR | Status: AC
  Administered 2024-06-19: 90 mg via SUBCUTANEOUS
  Filled 2024-06-19: qty 1

## 2024-06-19 MED ORDER — ONDANSETRON HCL 4 MG/2ML IJ SOLN: 4.0000 mg | Freq: Four times a day (QID) | INTRAMUSCULAR | Status: DC | PRN

## 2024-06-19 MED ORDER — ACETAMINOPHEN 325 MG PO TABS: 650.0000 mg | ORAL_TABLET | Freq: Four times a day (QID) | ORAL | Status: DC | PRN

## 2024-06-19 MED ORDER — ASPIRIN 81 MG PO TBEC
81.0000 mg | DELAYED_RELEASE_TABLET | Freq: Every day | ORAL | Status: DC
  Administered 2024-06-19 – 2024-06-20 (×2): 81 mg via ORAL
  Filled 2024-06-19 (×2): qty 1

## 2024-06-19 MED ORDER — SODIUM CHLORIDE 0.9 % IV SOLN
1.0000 g | INTRAVENOUS | Status: DC
  Administered 2024-06-19: 1 g via INTRAVENOUS
  Filled 2024-06-19: qty 10

## 2024-06-19 NOTE — Assessment & Plan Note (Addendum)
 Patient is not at baseline, lethargic,  out of it not at her baseline, worse since her recent fall but acutely worse in the past few days Likely secondary to UTI and dehydration Neurologic checks with fall and aspiration precautions Delirium precautions

## 2024-06-19 NOTE — Care Management Obs Status (Signed)
 MEDICARE OBSERVATION STATUS NOTIFICATION   Patient Details  Name: Jaclyn Galvan MRN: [id number] Date of Birth: 11-09-1940   Medicare Observation Status Notification Given:  Yes    Victory Jackquline RAMAN, RN 06/19/2024, 11:12 AM

## 2024-06-19 NOTE — ED Notes (Signed)
 Spoke with receiving RN at this time to complete handoff. RN acknowledged.

## 2024-06-19 NOTE — Plan of Care (Signed)

## 2024-06-19 NOTE — Assessment & Plan Note (Addendum)
 Elevated D-dimer, concern for PE Was reportedly hypoxic at facility Since arrival not hypoxic tachycardic or tachypneic D-dimer done following admission however elevated above 10 Elevated creatinine precludes contrasted study Will treat empirically with weight-based Lovenox  pending V/Q VQ scan in the a.m.

## 2024-06-19 NOTE — Plan of Care (Signed)
  Problem: Education: Goal: Knowledge of General Education information will improve Description: Including pain rating scale, medication(s)/side effects and non-pharmacologic comfort measures Outcome: Progressing   Problem: Clinical Measurements: Goal: Ability to maintain clinical measurements within normal limits will improve Outcome: Progressing Goal: Will remain free from infection Outcome: Progressing Goal: Diagnostic test results will improve Outcome: Progressing Goal: Respiratory complications will improve Outcome: Progressing Goal: Cardiovascular complication will be avoided Outcome: Progressing   Problem: Activity: Goal: Risk for activity intolerance will decrease Outcome: Progressing   Problem: Nutrition: Goal: Adequate nutrition will be maintained Outcome: Progressing   Problem: Safety: Goal: Ability to remain free from injury will improve Outcome: Progressing   Problem: Pain Managment: Goal: General experience of comfort will improve and/or be controlled Outcome: Progressing   Problem: Skin Integrity: Goal: Risk for impaired skin integrity will decrease Outcome: Progressing

## 2024-06-19 NOTE — Assessment & Plan Note (Signed)
 History of recent fall with rhabdomyolysis Hypoxia reported at facility, none since arrival Patient is not hypoxic, tachycardic or tachypneic however in view of recent hospitalization and reports of O2 sats in the 80s, will get D-dimer to evaluate need for further diagnostic workup for thromboembolism

## 2024-06-19 NOTE — Progress Notes (Signed)
 PROGRESS NOTE  [name]    DOB: 05/22/1941, 83 y.o.  FMW:[id number]    Code Status: Limited: Do not attempt resuscitation (DNR) -DNR-LIMITED -Do Not Intubate/DNI    DOA: 06/18/2024   LOS: 0   Brief hospital course  Jaclyn Galvan is a 83 y.o. female with a PMH significant for HTN, dementia, paroxysmal atrial fibrillation (not on [facility]),  recently hospitalized (8/30-9//25) with rhabdo secondary to fall requiring 1 unit PRBC during his stay, discharged to SNF, now being admitted with hypoxia, UTI and AKI.   In the ED vitals within normal limits with patient saturating at 96% on room air. Labs notable for normal WBC, troponin 10, BNP 13.6, Hemoglobin 11.2, up from 9.8 on discharge a couple weeks prior.  Creatinine 2.34 up from baseline of 0.98 EKG showing sinus at 76 Chest x-ray nonacute Patient started on Rocephin  and given a 1 L LR bolus.  06/19/24 -urine culture is pending. V/Q scan pending to evaluate for PE. Stable ORA  Assessment & Plan  Principal Problem:   AKI (acute kidney injury) (HCC) Active Problems:   Urinary tract infection   Hypoxia   Acute metabolic encephalopathy   Paroxysmal atrial fibrillation (HCC)   Dementia (HCC)   Generalized weakness  Possible UTI- per report, had urine culture outpatient. Results not available. Do not think she received antibiotics POA. She is denying urinary symptoms currently  AKI- greatly improved. Cr from 2.3>1.7 today s/p IV hydration.  - continue to monitor Hold home losartan  and torsemide Avoid nephrotoxins - UxCx pending. Currently on empiric Abx of rocephin . Will adjust as needed following culture results.    Hypoxia  Elevated D-dimer, concern for PE- per report from facility she was hypoxic. Has been stable ORA. Denies SOB - V/Q scan pending.  - currently on empiric Lovenox  pending V/Q   Acute metabolic encephalopathy- lethargic but answers questions appropriately and is oriented to self and location. Appears to be  improvement from admission. Neurologic checks with fall and aspiration precautions Delirium precautions  Dementia (HCC) Continue donepezil , duloxetine    Paroxysmal atrial fibrillation (HCC)- ECG on arrival showed sinus rhythm. Rate is well controlled. Not on AC at baseline due to high fall risk. Needed blood transfusion on recent hospitalization.  Continue aspirin   Body mass index is 32.08 kg/m.  VTE ppx: enoxaparin  (LOVENOX ) injection 40 mg Start: 06/19/24 2200  Diet:     Diet   Diet Heart Room service appropriate? Yes; Fluid consistency: Thin   Consultants: None   Subjective 06/19/24    Pt reports feeling fine. Denies SOB, dysuria. She is tired and requests to let her rest.    Objective  Blood pressure (!) 157/57, pulse 82, temperature 98 F (36.7 C), temperature source Oral, resp. rate 16, height 5' 7 (1.702 m), weight 92.9 kg, SpO2 95%.  Intake/Output Summary (Last 24 hours) at 06/19/2024 0716 Last data filed at 06/18/2024 2251 Gross per 24 hour  Intake --  Output 150 ml  Net -150 ml   Filed Weights   06/18/24 2036 06/19/24 0438  Weight: 90.7 kg 92.9 kg     Physical Exam:  General: alert to voice. Speaks to me appropriately but with eyes closed and appears to be sleeping throughout our conversation  HEENT: atraumatic, clear conjunctiva, anicteric sclera, MMM, hearing grossly normal Respiratory: normal respiratory effort. Cardiovascular: quick capillary refill, normal S1/S2, RRR, no JVD, murmurs Gastrointestinal: soft, NT, ND Nervous: A&O x3. no gross focal neurologic deficits, normal speech Extremities: moves all equally, no  edema, normal tone Skin: dry, intact, normal temperature, normal color. No rashes, lesions or ulcers on exposed skin Psychiatry: flat mood, congruent affect  Labs   I have personally reviewed the following labs and imaging studies CBC    Component Value Date/Time   WBC 10.0 06/19/2024 0559   RBC 3.60 (L) 06/19/2024 0559   HGB 10.4  (L) 06/19/2024 0559   HCT 32.7 (L) 06/19/2024 0559   PLT 282 06/19/2024 0559   MCV 90.8 06/19/2024 0559   MCH 28.9 06/19/2024 0559   MCHC 31.8 06/19/2024 0559   RDW 15.1 06/19/2024 0559   LYMPHSABS 0.7 06/18/2024 2053   MONOABS 0.6 06/18/2024 2053   EOSABS 0.2 06/18/2024 2053   BASOSABS 0.1 06/18/2024 2053      Latest Ref Rng & Units 06/19/2024    5:59 AM 06/18/2024    8:53 PM 06/09/2024    5:40 AM  BMP  Glucose 70 - 99 mg/dL 891  881  899   BUN 8 - 23 mg/dL 30  35  19   Creatinine 0.44 - 1.00 mg/dL 8.28  7.65  8.94   Sodium 135 - 145 mmol/L 142  139  138   Potassium 3.5 - 5.1 mmol/L 3.4  4.0  3.4   Chloride 98 - 111 mmol/L 103  97  100   CO2 22 - 32 mmol/L 28  30  29    Calcium  8.9 - 10.3 mg/dL 89.5  89.8  9.4    DG Chest Portable 1 View Result Date: 06/18/2024 CLINICAL DATA:  Shortness of breath EXAM: PORTABLE CHEST 1 VIEW COMPARISON:  06/04/2024 FINDINGS: Cardiac shadow is stable. Aortic calcifications are again noted. Lungs are clear bilaterally. No focal infiltrate or effusion is noted. No acute bony abnormality is seen. Degenerative changes in the shoulder joints are noted. IMPRESSION: No active disease. Electronically Signed   By: Oneil Devonshire M.D.   On: 06/18/2024 21:15   Disposition Plan & Communication  Patient status: Observation  Admitted From: SNF Planned disposition location: Skilled nursing facility Anticipated discharge date: 9/15 pending culture data   Family Communication: none at bedside    Author: Marien LITTIE Piety, DO [facility] 06/19/2024, 7:16 AM   Available by Epic secure chat 7AM-7PM. If 7PM-7AM, please contact night-coverage.  [facility] contact information found on [email or web address].

## 2024-06-20 ENCOUNTER — Observation Stay

## 2024-06-20 ENCOUNTER — Other Ambulatory Visit (HOSPITAL_COMMUNITY): Payer: Self-pay

## 2024-06-20 ENCOUNTER — Telehealth (HOSPITAL_COMMUNITY): Payer: Self-pay | Admitting: Pharmacy Technician

## 2024-06-20 DIAGNOSIS — N179 Acute kidney failure, unspecified: Secondary | ICD-10-CM | POA: Diagnosis not present

## 2024-06-20 LAB — CBC
HCT: 32.4 % — ABNORMAL LOW (ref 36.0–46.0)
Hemoglobin: 10.2 g/dL — ABNORMAL LOW (ref 12.0–15.0)
MCH: 28.8 pg (ref 26.0–34.0)
MCHC: 31.5 g/dL (ref 30.0–36.0)
MCV: 91.5 fL (ref 80.0–100.0)
Platelets: 281 K/uL (ref 150–400)
RBC: 3.54 MIL/uL — ABNORMAL LOW (ref 3.87–5.11)
RDW: 14.8 % (ref 11.5–15.5)
WBC: 6.8 K/uL (ref 4.0–10.5)
nRBC: 0 % (ref 0.0–0.2)

## 2024-06-20 LAB — BASIC METABOLIC PANEL WITH GFR
Anion gap: 9 (ref 5–15)
BUN: 22 mg/dL (ref 8–23)
CO2: 28 mmol/L (ref 22–32)
Calcium: 9.7 mg/dL (ref 8.9–10.3)
Chloride: 106 mmol/L (ref 98–111)
Creatinine, Ser: 1.34 mg/dL — ABNORMAL HIGH (ref 0.44–1.00)
GFR, Estimated: 39 mL/min — ABNORMAL LOW (ref >60)
Glucose, Bld: 97 mg/dL (ref 70–99)
Potassium: 3.5 mmol/L (ref 3.5–5.1)
Sodium: 143 mmol/L (ref 135–145)

## 2024-06-20 LAB — URINE CULTURE

## 2024-06-20 MED ORDER — CEPHALEXIN 500 MG PO CAPS: 500.0000 mg | ORAL_CAPSULE | Freq: Two times a day (BID) | ORAL | Status: AC

## 2024-06-20 MED ORDER — APIXABAN 5 MG PO TABS
10.0000 mg | ORAL_TABLET | Freq: Two times a day (BID) | ORAL | Status: DC
  Administered 2024-06-20: 10 mg via ORAL
  Filled 2024-06-20: qty 2

## 2024-06-20 MED ORDER — TECHNETIUM TO 99M ALBUMIN AGGREGATED
4.2500 | Freq: Once | INTRAVENOUS | Status: AC | PRN
  Administered 2024-06-20: 4.25 via INTRAVENOUS

## 2024-06-20 MED ORDER — APIXABAN 5 MG PO TABS: 5.0000 mg | ORAL_TABLET | Freq: Two times a day (BID) | ORAL | Status: DC

## 2024-06-20 MED ORDER — APIXABAN 5 MG PO TABS: ORAL_TABLET | ORAL | Status: AC

## 2024-06-20 NOTE — TOC Transition Note (Signed)
 Transition of Care [facility]) - Discharge Note   Patient Details  Name: Jaclyn Galvan MRN: [id number] Date of Birth: 03-02-1941  [facility]) CM/SW Contact:  Dalia GORMAN Fuse, RN Phone Number: 06/20/2024, 2:24 PM   Clinical Narrative:    Patient is medically clear to discharge to [facility] for STR. TOC spoke with [facility] [place] and she confirmed the patient doesn't need a new 3 night inpt stay because she had a 3 night inpt stay in the past 30 days. TOC spoke with the patient's daughter [name] and she is in agreement with the discharge plan. [facility] will transport. No other TOC needs identified.   Final next level of care: Skilled Nursing Facility Barriers to Discharge: Barriers Resolved   Patient Goals and CMS Choice            Discharge Placement              Patient chooses bed at: [facility] Patient to be transferred to facility by: [facility] Name of family member notified: [name] Patient and family notified of of transfer: 06/20/24  Discharge Plan and Services Additional resources added to the After Visit Summary for                                       Social Drivers of Health (SDOH) Interventions SDOH Screenings   Food Insecurity: No Food Insecurity (06/19/2024)  Housing: Low Risk  (06/19/2024)  Transportation Needs: No Transportation Needs (06/19/2024)  Utilities: Not At Risk (06/19/2024)  Financial Resource Strain: Low Risk  (03/17/2024)   Received from [facility]  Social Connections: Moderately Integrated (06/19/2024)  Tobacco Use: Low Risk  (06/04/2024)     Readmission Risk Interventions     No data to display

## 2024-06-20 NOTE — Discharge Instructions (Addendum)
 Information on my medicine - ELIQUIS  (apixaban )  This medication education was reviewed with me or my healthcare representative as part of my discharge preparation.  Why was Eliquis  prescribed for you? Eliquis  was prescribed to treat blood clots that may have been found in the veins of your legs (deep vein thrombosis) or in your lungs (pulmonary embolism) and to reduce the risk of them occurring again.  What do You need to know about Eliquis  ? The starting dose is 10 mg (two 5 mg tablets) taken TWICE daily for the FIRST SEVEN (7) DAYS, then on (enter date)  06/27/24  the dose is reduced to ONE 5 mg tablet taken TWICE daily.  Eliquis  may be taken with or without food.   Try to take the dose about the same time in the morning and in the evening. If you have difficulty swallowing the tablet whole please discuss with your pharmacist how to take the medication safely.  Take Eliquis  exactly as prescribed and DO NOT stop taking Eliquis  without talking to the doctor who prescribed the medication.  Stopping may increase your risk of developing a new blood clot.  Refill your prescription before you run out.  After discharge, you should have regular check-up appointments with your healthcare provider that is prescribing your Eliquis .    What do you do if you miss a dose? If a dose of ELIQUIS  is not taken at the scheduled time, take it as soon as possible on the same day and twice-daily administration should be resumed. The dose should not be doubled to make up for a missed dose.  Important Safety Information A possible side effect of Eliquis  is bleeding. You should call your healthcare provider right away if you experience any of the following: Bleeding from an injury or your nose that does not stop. Unusual colored urine (red or dark brown) or unusual colored stools (red or black). Unusual bruising for unknown reasons. A serious fall or if you hit your head (even if there is no  bleeding).  Some medicines may interact with Eliquis  and might increase your risk of bleeding or clotting while on Eliquis . To help avoid this, consult your healthcare provider or pharmacist prior to using any new prescription or non-prescription medications, including herbals, vitamins, non-steroidal anti-inflammatory drugs (NSAIDs) and supplements.  This website has more information on Eliquis  (apixaban ): [email or web address] .com/eliquis dena

## 2024-06-20 NOTE — Telephone Encounter (Signed)
 Patient Product/process development scientist completed.    The patient is insured through [employer]. Patient has Medicare and is not eligible for a copay card, but may be able to apply for patient assistance or Medicare RX Payment Plan (Patient Must reach out to their plan, if eligible for payment plan), if available.    Ran test claim for Eliquis  Starter Pack and the current 30 day co-pay is $30.00.   This test claim was processed through [facility]- copay amounts may vary at other pharmacies due to pharmacy/plan contracts, or as the patient moves through the different stages of their insurance plan.     Reyes Sharps, CPHT Pharmacy Technician III Certified Patient Advocate [facility] Patient Advocate Team Direct Number: [phone]  Fax: [phone]

## 2024-06-20 NOTE — Discharge Summary (Addendum)
 Physician Discharge Summary  Patient: Jaclyn Galvan FMW:[id number] DOB: [birth year]   Code Status: Limited: Do not attempt resuscitation (DNR) -DNR-LIMITED -Do Not Intubate/DNI  Admit date: 06/18/2024 Discharge date: 06/20/2024 Disposition: Skilled nursing facility, PT, OT, nurse aid, and RN PCP: [name], MD  Recommendations for Outpatient Follow-up:  Follow up with PCP within 1-2 weeks Regarding general hospital follow up and preventative care Recommend BMP Discuss eliquis  end date for small bilateral peripheral pulmonary emboli.   Discharge Diagnoses:  Principal Problem:   AKI (acute kidney injury) (HCC) Active Problems:   Urinary tract infection   Hypoxia   Acute metabolic encephalopathy   Paroxysmal atrial fibrillation (HCC)   Dementia (HCC)   Generalized weakness  Brief Hospital Course Summary: Jaclyn Galvan is a 83 y.o. female with a PMH significant for HTN, dementia, paroxysmal atrial fibrillation (not on [facility]),  recently hospitalized (8/30-9//25) with rhabdo secondary to fall requiring 1 unit PRBC during this stay, discharged to SNF. Now being admitted with reported hypoxia, lethargy. She has remained stable ORA since presentation. Subsequently found to have UTI and AKI.   In the ED vitals within normal limits with patient saturating at 96% on room air. Labs notable for normal WBC, troponin 10, BNP 13.6, Hemoglobin 11.2, up from 9.8 on discharge a couple weeks prior.  Creatinine 2.34 up from baseline of 0.98 EKG showing sinus at 76. Chest x-ray nonacute. Although she was not hypoxic during admission, had mildly elevated d-dimer so pulmonary perfusion scan was ordered to rule out PE. Showed small bilateral pulmonary emboli. Started on VTE treatment dose of eliquis  on day of dc. Will need at least 3 months of DOAC and follow up with PCP to discuss continuation or not.  Patient started on Rocephin  and given a 1 L LR bolus. Urine culture was collected on admission but  unfortunately was not a clean catch so cannot provide Abx guidance. She has shown marked clinical improvement on the rocephin , normal WBC, afebrile and denies urinary symptoms so transitioned to oral keflex  on day of dc.  AKI has greatly improved. Cr 1.34 on day of discharge with supportive care. Would recommend repeat BMP in 1-2 weeks to ensure return to baseline around 1.    All other chronic conditions were treated with home medications.    Discharge Condition: Stable, improved Recommended discharge diet: Regular healthy diet  Consultations: None   Procedures/Studies: V/Q scan  Allergies as of 06/20/2024       Reactions   Ciprofloxacin Nausea And Vomiting   Morphine  And Codeine Nausea And Vomiting   Pantoprazole  Other (See Comments)   Muscle spasms        Medication List     STOP taking these medications    ESTROVEN PMS PO   sertraline  25 MG tablet Commonly known as: ZOLOFT        TAKE these medications    amLODipine  5 MG tablet Commonly known as: NORVASC  Take 5 mg by mouth daily.   apixaban  5 MG Tabs tablet Commonly known as: ELIQUIS  Take 2 tablets (10 mg total) by mouth 2 (two) times daily for 7 days, THEN 1 tablet (5 mg total) 2 (two) times daily. Start taking on: June 20, 2024   ascorbic acid  1000 MG tablet Commonly known as: VITAMIN C  Take 1,000 mg by mouth daily.   aspirin  EC 81 MG tablet Take 81 mg by mouth daily.   aspirin -acetaminophen -caffeine 250-250-65 MG tablet Commonly known as: EXCEDRIN MIGRAINE Take 1 tablet by  mouth every 6 (six) hours as needed for headache.   atorvastatin  10 MG tablet Commonly known as: LIPITOR Take 5 mg by mouth daily.   calcium  carbonate 1250 MG capsule Take 1,250 mg by mouth 2 (two) times daily with a meal.   cephALEXin  500 MG capsule Commonly known as: KEFLEX  Take 1 capsule (500 mg total) by mouth 2 (two) times daily for 5 days.   cholecalciferol  10 MCG (400 UNIT) Tabs tablet Commonly known as:  VITAMIN D3 Take 400 Units by mouth daily.   co-enzyme Q-10 30 MG capsule Take 30 mg by mouth daily.   donepezil  5 MG tablet Commonly known as: ARICEPT  Take 5 mg by mouth at bedtime.   DULoxetine  HCl 30 MG Csdr Take 30 mg by mouth daily.   Estroven Menopause Relief Caps Take 1 capsule by mouth at bedtime.   famotidine  20 MG tablet Commonly known as: PEPCID  Take 1 tablet (20 mg total) by mouth daily.   losartan  100 MG tablet Commonly known as: COZAAR  Take 100 mg by mouth daily.   multivitamin tablet Take 1 tablet by mouth daily.   ondansetron  4 MG tablet Commonly known as: ZOFRAN  Take 4 mg by mouth every 6 (six) hours as needed for nausea or vomiting.   torsemide 20 MG tablet Commonly known as: DEMADEX Take 20 mg by mouth every other day.   vitamin B-12 100 MCG tablet Commonly known as: CYANOCOBALAMIN  Take 100 mcg by mouth daily.   Wixela Inhub 250-50 MCG/ACT Aepb Generic drug: fluticasone-salmeterol Inhale 1 puff into the lungs in the morning and at bedtime.        Contact information for follow-up providers     [name], MD Follow up.   Specialty: Internal Medicine Why: hospital follow up Contact information: [address] [facility] [place] [address] [phone]              Contact information for after-discharge care     Destination     [facility] .   Service: Skilled Nursing Contact information: [address] [place]  [address] [phone]                     Subjective   Pt reports no complaints today. She is more alert. Denies dysuria. She would like to be discharged. Denies chest pain or SOB.   All questions and concerns were addressed at time of discharge.  Objective  Blood pressure (!) 165/68, pulse 79, temperature 97.6 F (36.4 C), temperature source Oral, resp. rate 16, height 5' 7 (1.702 m), weight  92.9 kg, SpO2 98%.   General: Pt is alert, awake, not in acute distress Cardiovascular: RRR, S1/S2 +, no rubs, no gallops Respiratory: CTA bilaterally, no wheezing, no rhonchi Abdominal: Soft, NT, ND, bowel sounds + Extremities: no edema, no cyanosis  The results of significant diagnostics from this hospitalization (including imaging, microbiology, ancillary and laboratory) are listed below for reference.   Imaging studies: NM Pulmonary Perfusion Result Date: 06/20/2024 CLINICAL DATA:  Short of breath.  Concern pulmonary embolism. EXAM: NUCLEAR MEDICINE PERFUSION LUNG SCAN TECHNIQUE: Perfusion images were obtained in multiple projections after intravenous injection of radiopharmaceutical. RADIOPHARMACEUTICALS:  4.3 mCi Tc-86m MAA COMPARISON:  Radiograph 06/20/2024 FINDINGS: There multiple small bilateral peripheral perfusion defects. Defects involve the upper and lower lobes on LEFT and RIGHT. No corresponding findings on the CT portion exam. IMPRESSION: Findings most consistent with bilateral peripheral  pulmonary emboli. These results will be called to the ordering clinician or representative by the Radiologist Assistant, and communication documented in the PACS or [employer]. Electronically Signed   By: Jackquline Boxer M.D.   On: 06/20/2024 14:18   DG Chest Port 1 View Result Date: 06/20/2024 CLINICAL DATA:  Shortness of breath. EXAM: PORTABLE CHEST 1 VIEW COMPARISON:  06/18/2024. FINDINGS: Mild coarse bronchovascular markings are again seen, which are nonspecific but concerning for chronic bronchitis. Correlate clinically. Linear area of atelectasis/scarring noted in the left retrocardiac region. Bilateral lung fields are otherwise clear. No acute consolidation or lung collapse. Bilateral costophrenic angles are clear. Normal cardio-mediastinal silhouette. No acute osseous abnormalities. The soft tissues are within normal limits. IMPRESSION: Mild coarse bronchovascular markings are again  seen, which are nonspecific but concerning for chronic bronchitis. Electronically Signed   By: Ree Molt M.D.   On: 06/20/2024 13:48   DG Chest Portable 1 View Result Date: 06/18/2024 CLINICAL DATA:  Shortness of breath EXAM: PORTABLE CHEST 1 VIEW COMPARISON:  06/04/2024 FINDINGS: Cardiac shadow is stable. Aortic calcifications are again noted. Lungs are clear bilaterally. No focal infiltrate or effusion is noted. No acute bony abnormality is seen. Degenerative changes in the shoulder joints are noted. IMPRESSION: No active disease. Electronically Signed   By: Oneil Devonshire M.D.   On: 06/18/2024 21:15   DG Lumbar Spine 2-3 Views Result Date: 06/04/2024 CLINICAL DATA:  Fall EXAM: LUMBAR SPINE - 2-3 VIEW COMPARISON:  CT chest abdomen and pelvis 05/28/2023 FINDINGS: The bones are diffusely osteopenic. There is no evidence of lumbar spine fracture. Alignment is normal. There is diffuse moderate to severe intervertebral disc space narrowing with endplate osteophyte formation compatible with degenerative change. There also degenerative changes of facet joints diffusely. There is mild levoconvex curvature of the mid lumbar spine. L3-L5 laminectomy defects are again noted. There are atherosclerotic calcifications of the aorta and cholecystectomy clips. IMPRESSION: 1. No acute fracture or malalignment. 2. Moderate to severe degenerative changes of the lumbar spine. Electronically Signed   By: Greig Pique M.D.   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 No evidence for hardware loosening or acute fracture. No joint effusion. Joint spaces are well maintained. 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 No acute findings. Electronically Signed   By: Greig Pique M.D.   On: 06/04/2024 20:50    DG Knee Complete 4 Views Left Result Date: 06/04/2024 EXAM: 4 or more VIEW(S) XRAY OF THE LEFT KNEE 06/04/2024 08:30:00 PM COMPARISON: 02/08/2021 CLINICAL HISTORY: Fall. Pt brought in by EMS from [facility] for a fall. FINDINGS: BONES AND JOINTS: Total knee arthroplasty in expected alignment without acute complication. No acute or periprosthetic fracture. Chronic ossific density posteriorly likely an intraarticular body. SOFT TISSUES: The soft tissues are unremarkable. IMPRESSION: 1. Left knee arthroplasty in expected alignment without complication. Electronically signed by: Andrea Gasman MD 06/04/2024 08:49 PM EDT RP Workstation: [id number]   DG Chest 2 View Result Date: 06/04/2024 CLINICAL DATA:  Weakness EXAM: CHEST - 2 VIEW COMPARISON:  None Available. FINDINGS: Lungs are well expanded, symmetric, and clear. No pneumothorax or pleural effusion. Cardiac size within normal limits. Pulmonary vascularity is normal. Osseous structures are age-appropriate. No acute bone abnormality. IMPRESSION: 1. No active cardiopulmonary disease. Electronically Signed   By: Dorethia  Kimberlee M.D.   On: 06/04/2024 20:40   CT Cervical Spine Wo Contrast Result Date: 06/04/2024 EXAM: CT CERVICAL SPINE WITHOUT CONTRAST 06/04/2024 07:58:37 PM TECHNIQUE: CT of the cervical spine was performed without the administration of intravenous contrast. Multiplanar reformatted images are provided for review. Automated exposure control, iterative reconstruction, and/or weight based adjustment of the mA/kV was utilized to reduce the radiation dose to as low as reasonably achievable. COMPARISON: CT 8 days ago 05/28/23 CLINICAL HISTORY: Neck trauma (Age >= 65y). Pt brought in by EMS from [facility] for a fall. Per pt, she fell at about 3am when she went to the bathroom. Found by family at 5pm today. Pt denies hitting her head or any loss of consciousness. FINDINGS: CERVICAL SPINE: BONES AND ALIGNMENT: No  acute fracture or traumatic malalignment. DEGENERATIVE CHANGES: Stable diffuse degenerative disc disease and facet hypertrophy. SOFT TISSUES: No prevertebral soft tissue swelling. IMPRESSION: 1. No acute abnormality of the cervical spine related to the reported neck trauma. 2. Stable diffuse degenerative disc disease and facet hypertrophy. Electronically signed by: Andrea Gasman MD 06/04/2024 08:09 PM EDT RP Workstation: [id number]   CT Head Wo Contrast Result Date: 06/04/2024 EXAM: CT HEAD WITHOUT CONTRAST 06/04/2024 07:58:37 PM TECHNIQUE: CT of the head was performed without the administration of intravenous contrast. Automated exposure control, iterative reconstruction, and/or weight based adjustment of the mA/kV was utilized to reduce the radiation dose to as low as reasonably achievable. COMPARISON: 05/28/2023 CLINICAL HISTORY: Head trauma, minor (Age >= 65y). Pt brought in by EMS from [facility] for a fall. Per pt, she fell at about 3am when she went to the bathroom. Found by family at 5pm today. FINDINGS: BRAIN AND VENTRICLES: No acute hemorrhage. No evidence of acute infarct. No hydrocephalus. No extra-axial collection. No mass effect or midline shift. Stable atrophy and chronic small vessel ischemia. Remote high right frontal infarct. Unchanged lacunar infarcts in the left cerebellum and basal ganglia infarcts. ORBITS: No acute abnormality. SINUSES: No acute abnormality. SOFT TISSUES AND SKULL: Left parietal scalp hematoma. No skull fracture. IMPRESSION: 1. No acute intracranial abnormality. 2. Stable atrophy and chronic small vessel ischemia. Electronically signed by: Andrea Gasman MD 06/04/2024 08:05 PM EDT RP Workstation: [id number]    Labs: Basic Metabolic Panel: Recent Labs  Lab 06/18/24 2053 06/19/24 0559 06/20/24 0323  NA 139 142 143  K 4.0 3.4* 3.5  CL 97* 103 106  CO2 30 28 28   GLUCOSE 118* 108* 97  BUN 35* 30* 22  CREATININE 2.34* 1.71* 1.34*  CALCIUM   10.1 10.4* 9.7  MG 2.5*  --   --    CBC: Recent Labs  Lab 06/18/24 2053 06/19/24 0559 06/20/24 0323  WBC 8.6 10.0 6.8  NEUTROABS 7.0  --   --   HGB 11.2* 10.4* 10.2*  HCT 36.0 32.7* 32.4*  MCV 91.4 90.8 91.5  PLT 299 282 281   Microbiology: Results for orders placed or performed during the hospital encounter of 06/18/24  Resp panel by RT-PCR (RSV, Flu A&B, Covid) Anterior Nasal Swab     Status: None   Collection Time: 06/18/24  8:58 PM   Specimen: Anterior Nasal Swab  Result Value Ref Range Status   SARS Coronavirus 2 by RT PCR NEGATIVE NEGATIVE Final    Comment: (NOTE) SARS-CoV-2 target nucleic acids are NOT DETECTED.  The SARS-CoV-2 RNA is generally detectable in upper respiratory specimens during the acute phase of infection. The lowest concentration of SARS-CoV-2 viral copies this assay can  detect is 138 copies/mL. A negative result does not preclude SARS-Cov-2 infection and should not be used as the sole basis for treatment or other patient management decisions. A negative result may occur with  improper specimen collection/handling, submission of specimen other than nasopharyngeal swab, presence of viral mutation(s) within the areas targeted by this assay, and inadequate number of viral copies(<138 copies/mL). A negative result must be combined with clinical observations, patient history, and epidemiological information. The expected result is Negative.  Fact Sheet for Patients:  BloggerCourse.com  Fact Sheet for Healthcare Providers:  SeriousBroker.it  This test is no t yet approved or cleared by the [place]  FDA and  has been authorized for detection and/or diagnosis of SARS-CoV-2 by FDA under an Emergency Use Authorization (EUA). This EUA will remain  in effect (meaning this test can be used) for the duration of the COVID-19 declaration under Section 564(b)(1) of the Act, 21 U.S.C.section 360bbb-3(b)(1),  unless the authorization is terminated  or revoked sooner.       Influenza A by PCR NEGATIVE NEGATIVE Final   Influenza B by PCR NEGATIVE NEGATIVE Final    Comment: (NOTE) The Xpert Xpress SARS-CoV-2/FLU/RSV plus assay is intended as an aid in the diagnosis of influenza from Nasopharyngeal swab specimens and should not be used as a sole basis for treatment. Nasal washings and aspirates are unacceptable for Xpert Xpress SARS-CoV-2/FLU/RSV testing.  Fact Sheet for Patients: BloggerCourse.com  Fact Sheet for Healthcare Providers: SeriousBroker.it  This test is not yet approved or cleared by the [place]  FDA and has been authorized for detection and/or diagnosis of SARS-CoV-2 by FDA under an Emergency Use Authorization (EUA). This EUA will remain in effect (meaning this test can be used) for the duration of the COVID-19 declaration under Section 564(b)(1) of the Act, 21 U.S.C. section 360bbb-3(b)(1), unless the authorization is terminated or revoked.     Resp Syncytial Virus by PCR NEGATIVE NEGATIVE Final    Comment: (NOTE) Fact Sheet for Patients: BloggerCourse.com  Fact Sheet for Healthcare Providers: SeriousBroker.it  This test is not yet approved or cleared by the [place]  FDA and has been authorized for detection and/or diagnosis of SARS-CoV-2 by FDA under an Emergency Use Authorization (EUA). This EUA will remain in effect (meaning this test can be used) for the duration of the COVID-19 declaration under Section 564(b)(1) of the Act, 21 U.S.C. section 360bbb-3(b)(1), unless the authorization is terminated or revoked.  Performed at [facility], [address] [place], [place] [address]   Urine Culture     Status: Abnormal   Collection Time: 06/18/24 10:34 PM   Specimen: Urine, Clean Catch  Result Value Ref Range Status   Specimen Description    Final    URINE, CLEAN CATCH Performed at [facility], [address] [place], [place] [address]    Special Requests   Final    NONE Performed at [facility], [address] [place], [place] [address]    Culture MULTIPLE SPECIES PRESENT, SUGGEST RECOLLECTION (A)  Final   Report Status 06/20/2024 FINAL  Final    Time coordinating discharge: Over 30 minutes  Marien LITTIE Piety, MD  [facility] 06/20/2024, 2:32 PM

## 2024-07-02 ENCOUNTER — Emergency Department
Admission: EM | Admit: 2024-07-02 | Discharge: 2024-07-03 | Disposition: A | Discharge status: Home / Self Care | Source: Skilled Nursing Facility | Attending: Emergency Medicine | Admitting: Emergency Medicine

## 2024-07-02 ENCOUNTER — Emergency Department

## 2024-07-02 ENCOUNTER — Other Ambulatory Visit: Payer: Self-pay

## 2024-07-02 DIAGNOSIS — M25512 Pain in left shoulder: Secondary | ICD-10-CM | POA: Diagnosis present

## 2024-07-02 DIAGNOSIS — I4891 Unspecified atrial fibrillation: Secondary | ICD-10-CM | POA: Diagnosis not present

## 2024-07-02 DIAGNOSIS — I129 Hypertensive chronic kidney disease with stage 1 through stage 4 chronic kidney disease, or unspecified chronic kidney disease: Secondary | ICD-10-CM | POA: Insufficient documentation

## 2024-07-02 DIAGNOSIS — N189 Chronic kidney disease, unspecified: Secondary | ICD-10-CM | POA: Diagnosis not present

## 2024-07-02 DIAGNOSIS — W19XXXA Unspecified fall, initial encounter: Secondary | ICD-10-CM | POA: Insufficient documentation

## 2024-07-02 NOTE — ED Provider Notes (Signed)
 [facility] Provider Note   Event Date/Time   First MD Initiated Contact with Patient 07/02/24 2138     (approximate) History  Fall  HPI Jaclyn Galvan is a 83 y.o. female with a stated past medical history of fibromyalgia, hypertension, DJD, CKD, atrial fibrillation, and hypercholesterolemia who presents after mechanical fall via EMS from [facility].  This fall was unwitnessed however patient denies hitting her head and has no loss of consciousness.  No obvious injury to the head per EMS.  Patient complaining of left lateral shoulder pain after she landed on his left shoulder.  No obvious deformities and active range of motion noted by EMS.  Patient is not complaining of any pain in any other region. ROS: Patient currently denies any vision changes, tinnitus, difficulty speaking, facial droop, sore throat, chest pain, shortness of breath, abdominal pain, nausea/vomiting/diarrhea, dysuria, or weakness/numbness/paresthesias in any extremity   Physical Exam  Triage Vital Signs: ED Triage Vitals [07/02/24 2137]  Encounter Vitals Group     BP (!) 143/75     Girls Systolic BP Percentile      Girls Diastolic BP Percentile      Boys Systolic BP Percentile      Boys Diastolic BP Percentile      Pulse Rate 77     Resp (!) 22     Temp 97.6 F (36.4 C)     Temp Source Oral     SpO2 96 %     Weight      Height      Head Circumference      Peak Flow      Pain Score 5     Pain Loc      Pain Education      Exclude from Growth Chart    Most recent vital signs: Vitals:   07/02/24 2137 07/02/24 2139  BP: (!) 143/75   Pulse: 77   Resp: (!) 22   Temp: 97.6 F (36.4 C)   SpO2: 96% 96%   General: Awake, oriented x4. CV:  Good peripheral perfusion. Resp:  Normal effort. Abd:  No distention. Other:  Elderly overweight Caucasian female resting comfortably in no acute distress.  Tenderness to palpation over the left deltoid ED Results / Procedures / Treatments   Labs (all labs ordered are listed, but only abnormal results are displayed) Labs Reviewed - No data to display RADIOLOGY ED MD interpretation: Left shoulder x-ray shows advanced osteoarthritis with no acute bony abnormality - All radiology independently interpreted and agree with radiology assessment Official radiology report(s): DG Shoulder Left Result Date: 07/02/2024 CLINICAL DATA:  Fall, left shoulder pain EXAM: DG SHOULDER 2+V*L* COMPARISON:  None Available. 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 No acute bony abnormality. Specifically, no fracture, subluxation, or dislocation. IMPRESSION: Advanced osteoarthritis.  No acute bony abnormality. Electronically Signed   By: Franky Crease M.D.   On: 07/02/2024 22:13   PROCEDURES: Critical Care performed: No Procedures MEDICATIONS ORDERED IN ED: Medications - No data to display IMPRESSION / MDM / ASSESSMENT AND PLAN / ED COURSE  I reviewed the triage vital signs and the nursing notes.                             The patient is on the cardiac monitor to evaluate for evidence of arrhythmia and/or significant heart rate changes.  Patient's presentation is most consistent with acute presentation with potential threat to life or bodily function. 83 year old female with the above-stated past medical history presents after mechanical fall from standing complaining of left shoulder pain DDx: Shoulder dislocation, humeral fracture, AC joint separation, clavicle fracture Plan: Left shoulder x-ray  Upon reassessment, patient's radiology results reviewed with daughter at bedside as well as herself.  Patient presses understanding as well as instructions for continued pain control in [facility].  All questions were answered and strict return precautions given prior to discharge  Dispo: Discharge home with PCP follow-up   FINAL CLINICAL IMPRESSION(S) /  ED DIAGNOSES   Final diagnoses:  Fall, initial encounter  Acute pain of left shoulder   Rx / DC Orders   ED Discharge Orders     None      Note:  This document was prepared using Dragon voice recognition software and may include unintentional dictation errors.   [name], MD 07/02/24 [phone]

## 2024-07-02 NOTE — ED Triage Notes (Signed)
 Patient brought in by EMS from [facility] with a c/o unwitness fall in the bathroom, unknown if she hit her head, denies LOC, no obvious injury to head. Patient c/o left shoulder pain stating she landed on left shoulder, no Obvious injury noted Active ROM noted. Patient alert and oriented x4

## 2024-07-02 NOTE — Discharge Instructions (Addendum)
 Please use ibuprofen (Motrin) up to 800 mg every 8 hours, naproxen (Naprosyn) up to 500 mg every 12 hours, and/or acetaminophen (Tylenol) up to 4 g/day for any continued pain.  Please do not use this medication regimen for longer than 7 days

## 2024-07-02 NOTE — ED Notes (Signed)
 Fall risk bundle is currently in place.

## 2024-09-05 NOTE — Progress Notes (Signed)
 Goals     . Follow my doctor's care plan
